# Patient Record
Sex: Female | Born: 1960 | Race: White | Hispanic: No | Marital: Single | Smoking: Former smoker
Health system: Southern US, Community
[De-identification: ages and names within clinical notes are randomized; demographics above are authoritative.]

## PROBLEM LIST (undated history)

## (undated) DIAGNOSIS — I7090 Unspecified atherosclerosis: Secondary | ICD-10-CM

## (undated) DIAGNOSIS — K7460 Unspecified cirrhosis of liver: Secondary | ICD-10-CM

## (undated) DIAGNOSIS — K449 Diaphragmatic hernia without obstruction or gangrene: Secondary | ICD-10-CM

## (undated) DIAGNOSIS — Z923 Personal history of irradiation: Secondary | ICD-10-CM

## (undated) DIAGNOSIS — IMO0001 Reserved for inherently not codable concepts without codable children: Secondary | ICD-10-CM

## (undated) DIAGNOSIS — IMO0002 Reserved for concepts with insufficient information to code with codable children: Secondary | ICD-10-CM

## (undated) DIAGNOSIS — C52 Malignant neoplasm of vagina: Secondary | ICD-10-CM

## (undated) HISTORY — DX: Reserved for concepts with insufficient information to code with codable children: IMO0002

## (undated) HISTORY — DX: Diaphragmatic hernia without obstruction or gangrene: K44.9

## (undated) HISTORY — DX: Reserved for inherently not codable concepts without codable children: IMO0001

## (undated) HISTORY — DX: Unspecified cirrhosis of liver: K74.60

## (undated) HISTORY — DX: Personal history of irradiation: Z92.3

## (undated) HISTORY — DX: Unspecified atherosclerosis: I70.90

---

## 1995-01-07 DIAGNOSIS — B1920 Unspecified viral hepatitis C without hepatic coma: Secondary | ICD-10-CM

## 1995-01-07 HISTORY — DX: Unspecified viral hepatitis C without hepatic coma: B19.20

## 2010-01-06 HISTORY — PX: ROBOTIC ASSISTED LAPAROSCOPIC HYSTERECTOMY AND SALPINGECTOMY: SHX6379

## 2014-08-02 ENCOUNTER — Encounter: Payer: Self-pay | Admitting: Nurse Practitioner

## 2014-08-16 ENCOUNTER — Encounter: Payer: Self-pay | Admitting: Certified Nurse Midwife

## 2014-08-30 ENCOUNTER — Ambulatory Visit: Payer: No Typology Code available for payment source | Attending: Gynecologic Oncology | Admitting: Gynecologic Oncology

## 2014-08-30 ENCOUNTER — Encounter: Payer: Self-pay | Admitting: Gynecologic Oncology

## 2014-08-30 VITALS — BP 165/93 | HR 73 | Temp 98.4°F | Resp 18 | Ht 68.0 in | Wt 163.4 lb

## 2014-08-30 DIAGNOSIS — R87629 Unspecified abnormal cytological findings in specimens from vagina: Secondary | ICD-10-CM | POA: Insufficient documentation

## 2014-08-30 DIAGNOSIS — R896 Abnormal cytological findings in specimens from other organs, systems and tissues: Secondary | ICD-10-CM | POA: Diagnosis present

## 2014-08-30 HISTORY — PX: COLPOSCOPY: SHX161

## 2014-08-30 NOTE — Patient Instructions (Signed)
We will call you with biopsy results.

## 2014-08-30 NOTE — Progress Notes (Signed)
Consult Note: Gyn-Onc  Consult was requested by NP Earnstine Regal for the evaluation of Kathy Little 54 y.o. 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  CC:  Chief Complaint  Patient presents with  . Genital Warts    Assessment/Plan:  [name]  is a 54 y.o.  year old with carcinoma on pap smear.   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  We will followup the results of today's biopsy. If they reveal VIN2 or 3, I will recommend CO2 laser of the upper vagina.  If they reveal invasive cancer, I will discuss options including upper vaginectomy or primary radiation. We would also obtain imaging with a PET to rule out distant disease (if invasive disease is found on biopsy).   HPI: Kathy Little is a 54 year old woman who is seen in consultation at the request of NP Earnstine Regal for squamous cell carcinoma cells seen on pap smear from 08/09/14.  The patient has a remote history of a laparoscopic total hysterectomy, BSO in [place] by Dr Loni Muse in 2012 for cervical dysplasia and abnormal uterine bleeding. Postoperatively she was seen once for "office laser", but did not have followup thereafter.  She began noticing brown vaginal discharge 3 weeks ago. This prompted her to be see by Earnstine Regal who performed an examination and pap smear (8.3.16). Results of the pap smear showed high risk HPV present and squamous cells showing squamous cell carcinoma.  The patient is a former smoker. She is otherwise very healthy. She has had no prior abdominal surgeries. She has had 2 vaginal deliveries.  Current Meds:  Outpatient Encounter Prescriptions as of 08/30/2014  Medication Sig  . ESTRACE VAGINAL 0.1 MG/GM vaginal cream INSERT 1 GRAM VAGINALLY THREE TIMES WEEKLY  . CHANTIX CONTINUING MONTH PAK 1 MG tablet TK 1 T PO BID  . CHANTIX STARTING MONTH PAK 0.5 MG X 11 & 1 MG X 42  tablet TK UTD   No facility-administered encounter medications on file as of 08/30/2014.    Allergy: Not on File  Social Hx:   Social History   Social History  . Marital Status: Unknown    Spouse Name: N/A  . Number of Children: N/A  . Years of Education: N/A   Occupational History  . Not on file.   Social History Main Topics  . Smoking status: Former Smoker -- 0.25 packs/day    Quit date: 08/28/2014  . Smokeless tobacco: Not on file  . Alcohol Use: No     Comment: occasional beer  . Drug Use: No  . Sexual Activity: Yes    Birth Control/ Protection: Condom   Other Topics Concern  . Not on file   Social History Narrative  . No narrative on file    Past Surgical Hx: History reviewed. No pertinent past surgical history.  Past Medical Hx: History reviewed. No pertinent past medical history.  Past Gynecological History:  Cervical dysplasia, hysterectomy in 2012  No LMP recorded.  Family Hx: History reviewed. No pertinent family history.  Review of Systems:  Constitutional  Feels well,    ENT Normal appearing ears and nares bilaterally Skin/Breast  No rash, sores, jaundice, itching, dryness Cardiovascular  No chest pain, shortness of breath, or edema  Pulmonary  No cough or wheeze.  Gastro Intestinal  No nausea, vomitting, or diarrhoea. No bright red blood per rectum, no abdominal pain, change  in bowel movement, or constipation.  Genito Urinary  No frequency, urgency, dysuria, + brown discharge Musculo Skeletal  No myalgia, arthralgia, joint swelling or pain  Neurologic  No weakness, numbness, change in gait,  Psychology  No depression, anxiety, insomnia.   Vitals:  Blood pressure 165/93, pulse 73, temperature 98.4 F (36.9 C), temperature source Oral, resp. rate 18, height 5\' 8"  (1.727 m), weight 163 lb 6.4 oz (74.118 kg), SpO2 98 %.  Physical Exam: WD in NAD Neck  Supple NROM, without any enlargements.  Lymph Node Survey No cervical  supraclavicular or inguinal adenopathy Cardiovascular  Pulse normal rate, regularity and rhythm. S1 and S2 normal.  Lungs  Clear to auscultation bilateraly, without wheezes/crackles/rhonchi. Good air movement.  Skin  No rash/lesions/breakdown  Psychiatry  Alert and oriented to person, place, and time  Abdomen  Normoactive bowel sounds, abdomen soft, non-tender and thin without evidence of hernia.  Back No CVA tenderness Genito Urinary  Vulva/vagina: Normal external female genitalia.  No lesions. No discharge or bleeding.  Bladder/urethra:  No lesions or masses, well supported bladder  Vagina: excrescences rough, verrucous lesions on the vaginal epithelium of the entire upper half of the vagina- colpo performed with biopsies (see below). Palpably normal and smooth. No masses or abnormal feeling vaginal mucosa. No paravaginal extension.   Cervix: surgically absent  Uterus: surgically absent   Adnexa: no palpable masses. Rectal  Good tone, no masses no cul de sac nodularity.  Extremities  No bilateral cyanosis, clubbing or edema.  Colposcopy and vaginal biopsy note: The patient provided verbal consent for the procedure. A speculum was placed. The entire vagina was inspected with the colposcope on white and green light.   5% acetic acid was applied. The entire upper half of the vagina stained white with acetic acid with abnormal vasculature noted. Verrucous lesions were present at the cuff and proximal right and left vaginal walls.   Biopsies were taken from the anterior vaginal cuff, and right and left proximal vaginal walls. Hemostasis was noted.  Donaciano Eva, MD   08/30/2014, 11:42 AM

## 2014-09-04 ENCOUNTER — Telehealth: Payer: Self-pay | Admitting: Gynecologic Oncology

## 2014-09-04 NOTE — Telephone Encounter (Signed)
Called and left patient a message to call back.  When she calls back I will inform her of the results of her biopsy showing invasive cancer/recurrent cervical cancer, and my recommendation will be for chemoradiation.  Donaciano Eva, MD

## 2014-09-05 ENCOUNTER — Telehealth: Payer: Self-pay | Admitting: Gynecologic Oncology

## 2014-09-05 DIAGNOSIS — N898 Other specified noninflammatory disorders of vagina: Secondary | ICD-10-CM

## 2014-09-05 DIAGNOSIS — C52 Malignant neoplasm of vagina: Secondary | ICD-10-CM

## 2014-09-05 NOTE — Telephone Encounter (Signed)
Patient calling requesting biopsy results.  Informed of biopsy results and need for chemorads per Dr. Denman George.  Also, per Dr. Serita Grit note, pt will need to have PET scan.  Patient verbalizing understanding.  Appts with Dr. Evlyn Clines, Dr. Gery Pray, and for PET scan will be arranged and the patient will be contacted with the dates and times.  Advised to call the office for any questions or concerns before that time.

## 2014-09-06 ENCOUNTER — Telehealth: Payer: Self-pay | Admitting: *Deleted

## 2014-09-06 NOTE — Telephone Encounter (Signed)
Called to notify patient that she is scheduled for an appointment with Dr. Sondra Come 09/13/14 at 2:30pm and a PET scan 09/15/14 at 8am. Patient wrote down information and is agreeable to both appointments - she states she already received a phone call with specific instructions for the PET scan. Told patient that we will be calling her back with an appointment to see Dr. Marko Plume as well. No questions or concerns voiced at this time.

## 2014-09-08 ENCOUNTER — Encounter: Payer: Self-pay | Admitting: Radiation Oncology

## 2014-09-08 ENCOUNTER — Telehealth: Payer: Self-pay | Admitting: *Deleted

## 2014-09-08 NOTE — Progress Notes (Addendum)
GYN Location of Tumor / Histology: invasive cancer/recurrent cervical cancer  Kathy Little presented when she began noticing brown vaginal discharge 4 weeks ago.  Biopsies revealed:   08/30/14 Diagnosis 1. Vagina, biopsy, anterior cuff - POORLY DIFFERENTIATED CARCINOMA WITH BASALOID FEATURES. - SEE MICROSCOPIC DESCRIPTION. 2. Vagina, biopsy, left vaginal wall - DETACHED EPITHELIAL FRAGMENTS WITH AT LEAST HIGH GRADE DYSPLASIA. - SEE MICROSCOPIC DESCRIPTION. 3. Vagina, biopsy, right vaginal wall - HIGH GRADE DYSPLASIA. - SEE MICROSCOPIC DESCRIPTION.  Past/Anticipated interventions by Gyn/Onc surgery, if any: biopsy done by Dr. Denman George on 08/30/14  Past/Anticipated interventions by medical oncology, if any: 09/15/14  Weight changes, if any: no  Bowel/Bladder complaints, if any: no  Nausea/Vomiting, if any: no  Pain issues, if any:  no  SAFETY ISSUES:  Prior radiation? no  Pacemaker/ICD? no  Possible current pregnancy? no  Is the patient on methotrexate? no  Current Complaints / other details:  PET scan scheduled for 09/15/14.  Patient has 3 children, (1 set of twins).  Reports she was having vaginal bleeding while using estrace cream.  She stopped using it on Friday and has not had any more bleeding.   BP 154/93 mmHg  Pulse 75  Temp(Src) 98.4 F (36.9 C) (Oral)  Resp 16  Ht 5\' 8"  (1.727 m)  Wt 163 lb 14.4 oz (74.345 kg)  BMI 24.93 kg/m2  SpO2 99%

## 2014-09-08 NOTE — Telephone Encounter (Signed)
Left message with future appointment details. Asked patient to call the GYN ONC to verify appointment details have been recieved

## 2014-09-10 ENCOUNTER — Other Ambulatory Visit: Payer: Self-pay | Admitting: Oncology

## 2014-09-10 DIAGNOSIS — C539 Malignant neoplasm of cervix uteri, unspecified: Secondary | ICD-10-CM

## 2014-09-12 ENCOUNTER — Other Ambulatory Visit: Payer: No Typology Code available for payment source

## 2014-09-12 ENCOUNTER — Telehealth: Payer: Self-pay | Admitting: Nurse Practitioner

## 2014-09-12 NOTE — Telephone Encounter (Signed)
Patient unable to come to chemo edu class today at 12:00. Informed Ursula Beath, Edu RN. Per [place], patient to come 09/13/14 at 10:00. Patient informed and is agreeable to new date/time. Will reschedule apt to reflect change.

## 2014-09-13 ENCOUNTER — Encounter: Payer: Self-pay | Admitting: General Practice

## 2014-09-13 ENCOUNTER — Encounter: Payer: Self-pay | Admitting: Radiation Oncology

## 2014-09-13 ENCOUNTER — Encounter: Payer: Self-pay | Admitting: *Deleted

## 2014-09-13 ENCOUNTER — Other Ambulatory Visit: Payer: No Typology Code available for payment source

## 2014-09-13 ENCOUNTER — Ambulatory Visit
Admission: RE | Admit: 2014-09-13 | Discharge: 2014-09-13 | Disposition: A | Discharge status: Home / Self Care | Payer: No Typology Code available for payment source | Source: Ambulatory Visit | Attending: Radiation Oncology | Admitting: Radiation Oncology

## 2014-09-13 VITALS — BP 154/93 | HR 75 | Temp 98.4°F | Resp 16 | Ht 68.0 in | Wt 163.9 lb

## 2014-09-13 DIAGNOSIS — Z510 Encounter for antineoplastic radiation therapy: Secondary | ICD-10-CM | POA: Insufficient documentation

## 2014-09-13 DIAGNOSIS — C539 Malignant neoplasm of cervix uteri, unspecified: Secondary | ICD-10-CM | POA: Diagnosis present

## 2014-09-13 DIAGNOSIS — C52 Malignant neoplasm of vagina: Secondary | ICD-10-CM | POA: Diagnosis present

## 2014-09-13 DIAGNOSIS — F17200 Nicotine dependence, unspecified, uncomplicated: Secondary | ICD-10-CM | POA: Insufficient documentation

## 2014-09-13 DIAGNOSIS — Z800 Family history of malignant neoplasm of digestive organs: Secondary | ICD-10-CM | POA: Diagnosis not present

## 2014-09-13 DIAGNOSIS — R87629 Unspecified abnormal cytological findings in specimens from vagina: Secondary | ICD-10-CM

## 2014-09-13 HISTORY — DX: Malignant neoplasm of vagina: C52

## 2014-09-13 NOTE — Progress Notes (Signed)
Radiation Oncology         [phone] ________________________________  Initial Outpatient Consultation  Name: Kathy Little MRN: [id number]  Date: 09/13/2014  DOB: May 16, 1960  CC:No primary care provider on file.  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 female who is here as courtesy of Dr. Denman George.   The patient has a remote history of a laparoscopic total hysterectomy, BSO in [place] by Dr Loni Muse in 2012 for cervical dysplasia and abnormal uterine bleeding. Postoperatively she was seen once for "office laser", but did not have followup thereafter.   She began noticing brown vaginal discharge 3 weeks ago. This prompted her to be see by Earnstine Regal who performed an examination and pap smear (8.3.16). Results of the pap smear showed high risk HPV present and squamous cells showing squamous cell carcinoma.   The patient is a former smoker. She is otherwise very healthy. She has had no prior abdominal surgeries. She has had 2 vaginal deliveries. She had a hysterectomy in 2013.    PREVIOUS RADIATION THERAPY: No  PAST MEDICAL HISTORY:  has a past medical history of Vaginal cancer.    PAST SURGICAL HISTORY: Past Surgical History  Procedure Laterality Date  . Colposcopy  08/30/14    and vaginal biopsy  . Robotic assisted laparoscopic hysterectomy and salpingectomy  2012    Dr. Loni Muse, [place]: family history includes Colon cancer in her mother.  SOCIAL HISTORY:  reports that she quit smoking about 2 weeks ago. She has never used smokeless tobacco. She reports that she does not drink alcohol or use illicit drugs.  ALLERGIES: Flagyl  MEDICATIONS:  Current Outpatient Prescriptions  Medication Sig Dispense Refill  . CHANTIX CONTINUING MONTH PAK 1 MG tablet TK 1 T PO BID  1  . CHANTIX STARTING MONTH PAK 0.5 MG X 11 & 1  MG X 42 tablet TK UTD  0  . ESTRACE VAGINAL 0.1 MG/GM vaginal cream INSERT 1 GRAM VAGINALLY THREE TIMES WEEKLY  11   No current facility-administered medications for this encounter.    REVIEW OF SYSTEMS:  A 15 point review of systems is documented in the electronic medical record. This was obtained by the nursing staff. However, I reviewed this with the patient to discuss relevant findings and make appropriate changes.   PET scan scheduled for 09/15/14. Patient has 3 children, (1 set of twins). Reports she was having vaginal bleeding while using estrace cream. She stopped using it on Friday and has not had any more bleeding. Denies bowel or appetite changes. Denies fatigue.    PHYSICAL EXAM:  height is 5\' 8"  (1.727 m) and weight is 163 lb 14.4 oz (74.345 kg). Her oral temperature is 98.4 F (36.9 C). Her blood pressure is 154/93 and her pulse is 75. Her respiration is 16 and oxygen saturation is 99%.    Neck: No palpable cervical or supraclavicular adenopathy. Eyes: PERRL Lungs: Clear to auscultation. Heart: Regular rhythm and rate. Extremities: No edema. Distal pulses intact. Pelvic: The external genitalia are unremarkable. Speculum exam is performed. There is some erythema and biopsy changes in the proximal vagina. No obvious visible mass or mucosal lesion. On bimanual examination there no pelvic masses appreciated. Motor strength is 5 out of 5 in the proximal and distal muscle groups of the upper lower extremities    ECOG =  1   LABORATORY DATA:  No results found for: WBC, HGB, HCT, MCV, PLT, NEUTROABS No results found for: NA, K, CL, CO2, GLUCOSE, CREATININE, CALCIUM    RADIOGRAPHY: No results found.    IMPRESSION: Ms. Onken is a pleasant 54 yo woman with invasive cancer/recurrent cervical cancer. Staging workup pending PET scan. If disease is localized to the vaginal area then she may be a potential candidate for partial vaginectomy. Other options to consider would be primary  radiation therapy with radiosensitizing chemotherapy particularly if the patient has nodal metastasis or more significant disease.  PLAN: Follow up after her PET scan on 09/15/14 to discuss possible radiation therapy.      This document serves as a record of services personally performed by Gery Pray, MD. It was created on his behalf by Arlyce Harman, a trained medical scribe. The creation of this record is based on the scribe's personal observations and the provider's statements to them. This document has been checked and approved by the attending provider. ------------------------------------------------  Blair Promise, PhD, MD

## 2014-09-13 NOTE — Progress Notes (Signed)
Spiritual Care Note  Met pt, who goes by Juliann Pulse, in chemo class this morning, providing pastoral presence, empathic listening, and normalization of feelings as she shared and processed the knowing/unknowing balance she's struggling with:  At the time of our meeting, she knew "that I have cancer bad enough to need all this [chemo, etc]," but not yet "any information about what it is or the extent of it."  Assured her of my ongoing availability for support for more processing, reflection, assistance with coping tools/meaning-making, or other emotional needs that may arise during treatment and reorientation.  Please also page as needed/desired.  Thank you.  [place], [place] Pager [phone] Voicemail [phone]

## 2014-09-15 ENCOUNTER — Ambulatory Visit (HOSPITAL_BASED_OUTPATIENT_CLINIC_OR_DEPARTMENT_OTHER): Payer: No Typology Code available for payment source | Admitting: Oncology

## 2014-09-15 ENCOUNTER — Telehealth: Payer: Self-pay | Admitting: Oncology

## 2014-09-15 ENCOUNTER — Encounter: Payer: Self-pay | Admitting: Oncology

## 2014-09-15 ENCOUNTER — Encounter (HOSPITAL_COMMUNITY): Payer: No Typology Code available for payment source

## 2014-09-15 ENCOUNTER — Ambulatory Visit (HOSPITAL_COMMUNITY)
Admission: RE | Admit: 2014-09-15 | Discharge: 2014-09-15 | Disposition: A | Discharge status: Home / Self Care | Payer: No Typology Code available for payment source | Source: Ambulatory Visit | Attending: Gynecologic Oncology | Admitting: Gynecologic Oncology

## 2014-09-15 ENCOUNTER — Ambulatory Visit: Payer: No Typology Code available for payment source

## 2014-09-15 ENCOUNTER — Other Ambulatory Visit (HOSPITAL_BASED_OUTPATIENT_CLINIC_OR_DEPARTMENT_OTHER): Payer: No Typology Code available for payment source

## 2014-09-15 VITALS — BP 157/71 | HR 61 | Temp 98.6°F | Resp 18 | Ht 68.0 in | Wt 164.3 lb

## 2014-09-15 DIAGNOSIS — A630 Anogenital (venereal) warts: Secondary | ICD-10-CM

## 2014-09-15 DIAGNOSIS — F17201 Nicotine dependence, unspecified, in remission: Secondary | ICD-10-CM | POA: Insufficient documentation

## 2014-09-15 DIAGNOSIS — D3502 Benign neoplasm of left adrenal gland: Secondary | ICD-10-CM | POA: Diagnosis not present

## 2014-09-15 DIAGNOSIS — Z0000 Encounter for general adult medical examination without abnormal findings: Secondary | ICD-10-CM

## 2014-09-15 DIAGNOSIS — C539 Malignant neoplasm of cervix uteri, unspecified: Secondary | ICD-10-CM

## 2014-09-15 DIAGNOSIS — K635 Polyp of colon: Secondary | ICD-10-CM

## 2014-09-15 DIAGNOSIS — R945 Abnormal results of liver function studies: Secondary | ICD-10-CM

## 2014-09-15 DIAGNOSIS — I2510 Atherosclerotic heart disease of native coronary artery without angina pectoris: Secondary | ICD-10-CM | POA: Diagnosis not present

## 2014-09-15 DIAGNOSIS — C801 Malignant (primary) neoplasm, unspecified: Secondary | ICD-10-CM | POA: Diagnosis not present

## 2014-09-15 DIAGNOSIS — D696 Thrombocytopenia, unspecified: Secondary | ICD-10-CM | POA: Diagnosis not present

## 2014-09-15 DIAGNOSIS — C52 Malignant neoplasm of vagina: Secondary | ICD-10-CM | POA: Insufficient documentation

## 2014-09-15 DIAGNOSIS — I700 Atherosclerosis of aorta: Secondary | ICD-10-CM

## 2014-09-15 DIAGNOSIS — Z800 Family history of malignant neoplasm of digestive organs: Secondary | ICD-10-CM | POA: Insufficient documentation

## 2014-09-15 DIAGNOSIS — Z23 Encounter for immunization: Secondary | ICD-10-CM

## 2014-09-15 DIAGNOSIS — R7989 Other specified abnormal findings of blood chemistry: Secondary | ICD-10-CM

## 2014-09-15 DIAGNOSIS — N898 Other specified noninflammatory disorders of vagina: Secondary | ICD-10-CM

## 2014-09-15 DIAGNOSIS — S68119A Complete traumatic metacarpophalangeal amputation of unspecified finger, initial encounter: Secondary | ICD-10-CM | POA: Insufficient documentation

## 2014-09-15 DIAGNOSIS — Z90710 Acquired absence of both cervix and uterus: Secondary | ICD-10-CM | POA: Diagnosis not present

## 2014-09-15 DIAGNOSIS — S68119D Complete traumatic metacarpophalangeal amputation of unspecified finger, subsequent encounter: Secondary | ICD-10-CM

## 2014-09-15 LAB — CBC WITH DIFFERENTIAL/PLATELET
BASO%: 0.7 % (ref 0.0–2.0)
BASOS ABS: 0 10*3/uL (ref 0.0–0.1)
EOS%: 6.6 % (ref 0.0–7.0)
Eosinophils Absolute: 0.3 10*3/uL (ref 0.0–0.5)
HEMATOCRIT: 49 % — AB (ref 34.8–46.6)
HGB: 16.8 g/dL — ABNORMAL HIGH (ref 11.6–15.9)
LYMPH#: 1.5 10*3/uL (ref 0.9–3.3)
LYMPH%: 33.4 % (ref 14.0–49.7)
MCH: 32.2 pg (ref 25.1–34.0)
MCHC: 34.3 g/dL (ref 31.5–36.0)
MCV: 94 fL (ref 79.5–101.0)
MONO#: 0.3 10*3/uL (ref 0.1–0.9)
MONO%: 7.4 % (ref 0.0–14.0)
NEUT#: 2.4 10*3/uL (ref 1.5–6.5)
NEUT%: 51.9 % (ref 38.4–76.8)
Platelets: 101 10*3/uL — ABNORMAL LOW (ref 145–400)
RBC: 5.21 10*6/uL (ref 3.70–5.45)
RDW: 12.2 % (ref 11.2–14.5)
WBC: 4.5 10*3/uL (ref 3.9–10.3)

## 2014-09-15 LAB — MAGNESIUM (CC13): MAGNESIUM: 2.3 mg/dL (ref 1.5–2.5)

## 2014-09-15 LAB — COMPREHENSIVE METABOLIC PANEL (CC13)
ALT: 117 U/L — AB (ref 0–55)
AST: 114 U/L — AB (ref 5–34)
Albumin: 3.9 g/dL (ref 3.5–5.0)
Alkaline Phosphatase: 178 U/L — ABNORMAL HIGH (ref 40–150)
Anion Gap: 6 mEq/L (ref 3–11)
BUN: 19.2 mg/dL (ref 7.0–26.0)
CHLORIDE: 108 meq/L (ref 98–109)
CO2: 29 meq/L (ref 22–29)
CREATININE: 0.8 mg/dL (ref 0.6–1.1)
Calcium: 9.7 mg/dL (ref 8.4–10.4)
EGFR: 85 mL/min/{1.73_m2} — ABNORMAL LOW (ref >90)
GLUCOSE: 93 mg/dL (ref 70–140)
POTASSIUM: 4.4 meq/L (ref 3.5–5.1)
SODIUM: 144 meq/L (ref 136–145)
Total Bilirubin: 0.85 mg/dL (ref 0.20–1.20)
Total Protein: 7.7 g/dL (ref 6.4–8.3)

## 2014-09-15 LAB — GLUCOSE, CAPILLARY: Glucose-Capillary: 89 mg/dL (ref 65–99)

## 2014-09-15 MED ORDER — LORAZEPAM 1 MG PO TABS: ORAL_TABLET | ORAL | Status: DC

## 2014-09-15 MED ORDER — INFLUENZA VAC SPLIT QUAD 0.5 ML IM SUSY
0.5000 mL | PREFILLED_SYRINGE | Freq: Once | INTRAMUSCULAR | Status: AC
Start: 2014-09-15 — End: 2014-09-15
  Administered 2014-09-15: 0.5 mL via INTRAMUSCULAR
  Filled 2014-09-15: qty 0.5

## 2014-09-15 MED ORDER — FLUDEOXYGLUCOSE F - 18 (FDG) INJECTION
8.0000 | Freq: Once | INTRAVENOUS | Status: DC | PRN
  Administered 2014-09-15: 8 via INTRAVENOUS
  Filled 2014-09-15: qty 8

## 2014-09-15 MED ORDER — ONDANSETRON HCL 8 MG PO TABS: 8.0000 mg | ORAL_TABLET | Freq: Three times a day (TID) | ORAL | Status: DC | PRN

## 2014-09-15 NOTE — Progress Notes (Signed)
[place] NEW PATIENT EVALUATION   Name: Kathy Little Date: September 15, 2014  MRN: [id number] DOB: 1960-05-03  REFERRING PHYSICIAN: Everitt Amber CC: Gery Pray, Earnstine Regal. No PCP in [place].   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 Patient has history of hysterectomy BSO in [place] 2012 for cervical dysplasia, those records requested by gyn oncology but not available at time of this consultation.   Patient moved to [facility] from [place] 2 years ago and had not established with physicians in [place] prior to recently noting 3 weeks of brownish vaginal discharge with some spotting. 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 Ezam by Dr Denman George was significant for verrucous lesions upper half of vagina but no masses or abnormal feeling vaginal mucosa and no paravaginal extension on exam. She had consultation with Dr Sondra Come on 09-13-14. PET done earlier day of this consultation (09-15-14) has no evidence of metastatic disease. Recommendation per my discussion with Dr Denman George now are for radiation with sensitizing CDDP in preference to vaginectomy, given her young age and apparent superficial involvement of the invasive cancer. Patient attended chemotherapy education class on 09-13-14.  Patient reports feeling well other than the slight vaginal drainage and spotting. Energy and appetite are good and weight is stable. She has no other bleeding. She has seasonal allergies, with slight cough NP in past few days only. No HA, reading  glasses, no difficulty hearing. No history of thyroid disease. No active dental concerns. No noted changes in breasts, had mammograms with [facility]ell reportedly unremarkable. Occasional GERD not new, uses occasional zantac. No change in bowels or noted blood in stools. Mild arthritis symptoms hands and knees. No cardiac symptoms. Bladder fine. No fever or symptoms of infection. No LE swelling. Peripheral IV access not difficult. No peripheral neuropathy. Minimal hot flashes prior to hysterectomy BSO, with severe hot flashes since that surgery, still daily.   Remainder of full 10 point review of systems negative.   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 Prescriptions for zofran and [place]   SOCIAL HISTORY: from [place], moved to [place] 2014 with significant other Lanny Hurst due to his job. 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 Significant other had worked with special needs individuals, now does transport for special needs and works part time with [employer]. Patient smokes < 1/2 pp week "social only" and has been able to quit for up to a year in past, has not smoked now in 2 weeks. Occasional beer, no drugs.  Patient has not found employment since moving to [facility]. One daughter here is Radio producer, son is high functioning with autism and employed with [employer]. One daughter in [place] has just started teaching special needs students. Patient  does not drive. "all of my children are 21 for the next few weeks"   FAMILY HISTORY:   Mother colon cancer age 50, thyroid disease, arthritis Father died of [place] age 74 Maternal grandfather pancreatic cancer Maternal grandmother dementia Son autistic, high functioning 2 daughters healthy        PHYSICAL EXAM:  height is 5' 8"  (1.727 m) and weight is 164 lb 4.8 oz (74.526 kg). Her oral temperature is 98.6 F (37 C). Her blood pressure is 157/71 and her pulse is 61. Her respiration is 18 and oxygen saturation is 100%.  Alert, pleasant, cooperative, healthy appearing lady looks stated age, excellent historian  HEENT: normal hair pattern, PERRL, not icteric. Oral mucosa and posterior pharynx clear and moist. Neck supple without JVD or thyroid mass.   RESPIRATORY:lungs clear to A and P  CARDIAC/ VASCULAR: heart RRR no gallop. Peripheral pulses intact and symmetrical  ABDOMEN: soft, not tender, no distension, no HSM or mass. Normally active BS  LYMPH NODES: no cervical, supraclavicular, axillary or inguinal adenopathy  BREASTS: bilaterally without dominant mass, skin or nipple findings. Axillae benign  NEUROLOGIC: CN, motor, sensory, cerebellar nonfocal . Left handed even prior to right fingers amputated. PSYCH appropriate mood and affect  SKIN: without rash, ecchymosis, petechiae  MUSCULOSKELETAL: back nontender. LE no edema, cords, tenderness. Amputation of fingers 3-4-5 right hand    LABORATORY DATA:  Results for orders placed or performed in visit on 09/15/14 (from the past 48 hour(s))  CBC with Differential     Status: Abnormal   Collection Time: 09/15/14  9:50 AM  Result Value Ref Range   WBC 4.5 3.9 - 10.3 10e3/uL   NEUT# 2.4 1.5 - 6.5 10e3/uL   HGB 16.8 (H) 11.6 - 15.9 g/dL   HCT 49.0 (H) 34.8 - 46.6 %   Platelets 101 (L) 145 - 400 10e3/uL   MCV 94.0 79.5 - 101.0 fL   MCH 32.2 25.1 - 34.0 pg   MCHC 34.3 31.5 - 36.0 g/dL   RBC 5.21 3.70 - 5.45 10e6/uL   RDW 12.2  11.2 - 14.5 %   lymph# 1.5 0.9 - 3.3 10e3/uL   MONO# 0.3 0.1 - 0.9 10e3/uL   Eosinophils Absolute 0.3 0.0 - 0.5 10e3/uL   Basophils Absolute 0.0 0.0 - 0.1 10e3/uL   NEUT% 51.9 38.4 - 76.8 %   LYMPH% 33.4 14.0 - 49.7 %   MONO% 7.4 0.0 - 14.0 %   EOS% 6.6 0.0 - 7.0 %   BASO% 0.7 0.0 - 2.0 %  Comprehensive metabolic panel (Cmet) - [facility]     Status: Abnormal   Collection Time: 09/15/14  9:50 AM  Result Value Ref Range   Sodium 144 136 - 145 mEq/L   Potassium 4.4 3.5 - 5.1 mEq/L   Chloride 108 98 - 109 mEq/L   CO2 29 22 - 29 mEq/L   Glucose 93 70 - 140 mg/dl   BUN 19.2 7.0 - 26.0 mg/dL   Creatinine 0.8 0.6 - 1.1 mg/dL   Total Bilirubin 0.85 0.20 - 1.20 mg/dL   Alkaline Phosphatase 178 (H) 40 - 150 U/L   AST 114 (H) 5 - 34 U/L   ALT 117 (H) 0 - 55 U/L   Total Protein 7.7 6.4 -  8.3 g/dL   Albumin 3.9 3.5 - 5.0 g/dL   Calcium 9.7 8.4 - 10.4 mg/dL   Anion Gap 6 3 - 11 mEq/L   EGFR 85 (L) >90 ml/min/1.73 m2    Comment: eGFR is calculated using the CKD-EPI Creatinine Equation (2009)  Magnesium     Status: None   Collection Time: 09/15/14  9:50 AM  Result Value Ref Range   Magnesium 2.3 1.5 - 2.5 mg/dl     Patient not aware of low platelets in past, no priors for comparison CMET not available until after visit, no priors for comparison  PATHOLOGY: Patient: BETSY, ROSELLO Collected: 08/30/2014 Client: [facility] Accession: [id number] Received: 08/30/2014 Everitt Amber, MD DOB: 16-May-1960 Age: 33 Gender: F Reported: 09/06/2014 [address] FINAL DIAGNOSIS Diagnosis 1. Vagina, biopsy, anterior cuff - POORLY DIFFERENTIATED CARCINOMA WITH BASALOID FEATURES. - SEE MICROSCOPIC DESCRIPTION. 2. Vagina, biopsy, left vaginal wall - DETACHED EPITHELIAL FRAGMENTS WITH AT LEAST HIGH GRADE DYSPLASIA. - SEE MICROSCOPIC DESCRIPTION. 3. Vagina, biopsy, right vaginal wall - HIGH GRADE DYSPLASIA. - SEE MICROSCOPIC DESCRIPTION. Microscopic Comment 1. 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 There is a small amount of stroma present, and diagnostic stromal invasion is not identified in this biopsy. Immunohistochemistry will be performed and reported as an addendum. 2. There is blood and microscopic detached epithelial fragments with at least high grade dysplasia. One of the fragments has features which favors high grade squamous dysplasia. There is no stoma present to evaluate for invasion. 3. There is a mucosal fragment with high dysplasia and the features strongly favor high grade squamous dysplasia.  RADIOGRAPHY: Nm Pet Image Initial (pi) Skull Base To Thigh  09/15/2014   CLINICAL DATA:  Initial treatment strategy for newly diagnosed poorly differentiated vaginal carcinoma.  EXAM: NUCLEAR MEDICINE PET SKULL BASE TO THIGH  TECHNIQUE: 8.0 mCi F-18 FDG was injected intravenously. Full-ring PET imaging was performed from the skull base to thigh after the radiotracer. CT data was obtained and used for attenuation correction and anatomic localization.  FASTING BLOOD GLUCOSE:  Value: 89 mg/dl  COMPARISON:  None.  FINDINGS: NECK  No hypermetabolic lymph nodes in the neck. Surgical plate with interlocking screws is noted in the anterior cervical spine at C5-6.  CHEST  No hypermetabolic axillary, mediastinal or hilar nodes. There is atherosclerosis of the thoracic aorta, the great vessels of the mediastinum and the coronary arteries, including calcified atherosclerotic plaque in the left anterior descending, left circumflex and right coronary arteries. No significant pulmonary nodules or acute consolidative airspace disease.  ABDOMEN/PELVIS  There is focal hypermetabolism with max SUV 4.3 in the anterior vagina at the introitus, in keeping with the primary vaginal carcinoma.  No abnormal hypermetabolic activity within the liver, pancreas, adrenal glands, or spleen. No hypermetabolic lymph nodes in the abdomen or pelvis. Top-normal size spleen. Non  hypermetabolic 1.3 cm left adrenal nodule (series 4/image 112) with density of 8 HU, in keeping with a benign left adrenal adenoma. Status post hysterectomy, with no abnormal findings at the vaginal cuff. No adnexal mass.  SKELETON  No focal hypermetabolic activity to suggest skeletal metastasis.  IMPRESSION: 1. Focal hypermetabolism (max SUV 4.3) in the anterior vagina at the introitus, in keeping with a primary vaginal carcinoma. 2. No hypermetabolic pelvic lymphadenopathy or distant metastatic disease. 3. Atherosclerosis, including three-vessel coronary artery disease. Please note that although the presence of coronary artery calcium documents the presence of coronary artery disease, the severity of  this disease and any potential stenosis cannot be assessed on this non-gated CT examination. Assessment for potential risk factor modification, dietary therapy or pharmacologic therapy may be warranted, if clinically indicated.   Electronically Signed   By: Ilona Sorrel M.D.   On: 09/15/2014 10:15   PET images reviewed on PACs and information discussed directly with Dr Denman George at time of patient's visit.   Report of mammograms done at E.Powell's office not available   DISCUSSION: all of history above reviewed with patient, results of PET discussed and recommendation discussed for radiation with sensitizing CDDP including rationale for the sensitizing chemotherapy. She is comfortable with information that she has received concerning CDDP, including need for aggressive oral and IV hydration. Antiemetics discussed. The chemotherapy will be coordinated with radiation, to begin weekly treatments close to start of the radiation. Due to transportation, it will be easiest for her to arrive for treatments no earlier than ~ 1000 if possible. I will coordinate my visits with her other appointments as best possible. Platelets 100k noted on labs today, possibly related to spleen size upper limits normal. Chemistries with  elevated LFTs resulted after visit. I will repeat all of these labs ~ next week, with hepatitis labs, may need GI evaluation or other depending on results. Hopefully we will be able to proceed with treatment of the recurrent cervical cancer without delay, but need to clarify situation further.  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 Recommendation from gyn oncology for radiation with sensitizing CDDP. Scheduling pending 2.vaginal warts, high risk HPV 3.post laparoscopic total hysterectomy BSO in [place] 2012. Gyn oncology has requested records 4. Elevated hemoglobin and mild thrombocytopenia: no prior CBCs for comparison now, tho we will try to get records from physicians in [place]. Follow Hgb off of cigarettes. Spleen upper limits normal by PET. Repeat CBC with morphology within the week. Thrombocytopenia more significant with planned chemotherapy. 5. Elevated AP, AST, ALT with normal bilirubin, normal electrolytes, normal renal function. No history of same known to patient, will try to get records from prior physicians. Repeat + hepatitis studies within the week. May need GI evaluation. 6.history from patient of 5 colon polyps at colonoscopy 2013, recommendation for repeat "in 8 years" which may not be optimal. Family history colon cancer in mother  In 40s. Will need consultation with GI for this going forward 7.flu vaccine given today prior to start of chemo 8. Long but minimal tobacco, now [facility].  9.amputation of fingers right hand age 27 after trauma then severe frostbite 10.cervical spine orthopedic surgery 11.coronary artery calcifications on imaging. She needs to establish primary care, referral made now to internal medicine, with other cardiology evaluation as appropriate  from there.  Patient had questions answered to her satisfaction and is in agreement with plan above. Verbal consent given for chemotherapy. She can contact this office for questions or concerns at any time prior to next scheduled visit. Chemotherapy and granix orders placed for preauthorization, may need to change dates depending on RT schedule.  Time spent 60 min , including >50% discussion and coordination of care. Cc Drs Denman George and Christain Sacramento, MD 09/15/2014 11:32 AM

## 2014-09-15 NOTE — Telephone Encounter (Signed)
Gave avs & calendar for September/October. °

## 2014-09-15 NOTE — Addendum Note (Signed)
Encounter addended by: Jacqulyn Liner, RN on: 09/15/2014 12:32 PM<BR>     Documentation filed: Charges VN, Visit Diagnoses

## 2014-09-15 NOTE — Progress Notes (Signed)
Flu injection given by desk nurse.

## 2014-09-17 ENCOUNTER — Other Ambulatory Visit: Payer: Self-pay | Admitting: Oncology

## 2014-09-17 DIAGNOSIS — R7989 Other specified abnormal findings of blood chemistry: Secondary | ICD-10-CM | POA: Insufficient documentation

## 2014-09-17 DIAGNOSIS — D696 Thrombocytopenia, unspecified: Secondary | ICD-10-CM

## 2014-09-17 DIAGNOSIS — K7460 Unspecified cirrhosis of liver: Secondary | ICD-10-CM

## 2014-09-17 DIAGNOSIS — R945 Abnormal results of liver function studies: Secondary | ICD-10-CM

## 2014-09-17 DIAGNOSIS — I2510 Atherosclerotic heart disease of native coronary artery without angina pectoris: Secondary | ICD-10-CM | POA: Insufficient documentation

## 2014-09-18 ENCOUNTER — Other Ambulatory Visit: Payer: Self-pay | Admitting: Oncology

## 2014-09-18 ENCOUNTER — Telehealth: Payer: Self-pay | Admitting: Nurse Practitioner

## 2014-09-18 ENCOUNTER — Telehealth: Payer: Self-pay | Admitting: Oncology

## 2014-09-18 DIAGNOSIS — B1920 Unspecified viral hepatitis C without hepatic coma: Secondary | ICD-10-CM

## 2014-09-18 NOTE — Telephone Encounter (Signed)
Spoke with patient and she is aware of her lab work added for 9/13 per [facility]

## 2014-09-18 NOTE — Telephone Encounter (Signed)
-----   Message from Gordy Levan, MD sent at 09/17/2014  3:23 PM EDT ----- I have put in POF for labs to be repeated hopefully early this week - I sent it urgent to schedulers so they may have contacted patient by time RN gets message, but RN please let patient know that I want to recheck labs due to low platelets, that she and I discussed at visit, and also recheck liver chemistries and hepatitis tests, as liver chemistries returned with some elevations after her visit.   The PET did not show any problems on imaging of liver, but the chemistries could possibly be some type of hepatitis or a medication reaction.  Please ask if any history of hepatitis (she did work in nursing facility x years).  Please also get name of PCP in [place] and contact information if patient has it. Please have her sign release of information for [place] to request records, if not already done.   We need to be sure to let her know results of the repeat labs.  thanks

## 2014-09-18 NOTE — Telephone Encounter (Signed)
RN called patient regarding MD message below. She has been made aware of lab apt on 09/19/14. She remembers that she was diagnosed with Hepatitis C in 1998 and treated with "interferon and shots at home" until 1999 when treatment was completed. She received care at [facility] - gastroenterology. Patient reports she will bring a form tomorrow that will allow [place] to request records from care team in [place], [place] thanks her for this. Also notified that PET did not show liver problems, per MD. She knows to inform lab that she needs to see Dr. Mariana Kaufman RN tomorrow to hand off release of information form. She will call with any new questions or concerns and thanks for the call.

## 2014-09-19 ENCOUNTER — Encounter: Payer: Self-pay | Admitting: Nurse Practitioner

## 2014-09-19 ENCOUNTER — Ambulatory Visit (HOSPITAL_BASED_OUTPATIENT_CLINIC_OR_DEPARTMENT_OTHER): Payer: No Typology Code available for payment source

## 2014-09-19 DIAGNOSIS — D696 Thrombocytopenia, unspecified: Secondary | ICD-10-CM | POA: Diagnosis not present

## 2014-09-19 DIAGNOSIS — R7989 Other specified abnormal findings of blood chemistry: Secondary | ICD-10-CM

## 2014-09-19 DIAGNOSIS — R945 Abnormal results of liver function studies: Secondary | ICD-10-CM

## 2014-09-19 LAB — MORPHOLOGY
PLATELET MORPHOLOGY: NORMAL
PLT EST: DECREASED
RBC COMMENTS: NORMAL

## 2014-09-19 LAB — COMPREHENSIVE METABOLIC PANEL (CC13)
ALBUMIN: 3.8 g/dL (ref 3.5–5.0)
ALK PHOS: 163 U/L — AB (ref 40–150)
ALT: 110 U/L — ABNORMAL HIGH (ref 0–55)
ANION GAP: 8 meq/L (ref 3–11)
AST: 116 U/L — ABNORMAL HIGH (ref 5–34)
BILIRUBIN TOTAL: 0.65 mg/dL (ref 0.20–1.20)
BUN: 22.8 mg/dL (ref 7.0–26.0)
CALCIUM: 9.4 mg/dL (ref 8.4–10.4)
CO2: 27 meq/L (ref 22–29)
Chloride: 108 mEq/L (ref 98–109)
Creatinine: 0.8 mg/dL (ref 0.6–1.1)
EGFR: 86 mL/min/{1.73_m2} — AB (ref >90)
Glucose: 111 mg/dl (ref 70–140)
Potassium: 3.9 mEq/L (ref 3.5–5.1)
Sodium: 143 mEq/L (ref 136–145)
TOTAL PROTEIN: 7.3 g/dL (ref 6.4–8.3)

## 2014-09-19 LAB — CBC WITH DIFFERENTIAL/PLATELET
BASO%: 1 % (ref 0.0–2.0)
Basophils Absolute: 0 10*3/uL (ref 0.0–0.1)
EOS ABS: 0.2 10*3/uL (ref 0.0–0.5)
EOS%: 6.3 % (ref 0.0–7.0)
HCT: 45.1 % (ref 34.8–46.6)
HGB: 15.4 g/dL (ref 11.6–15.9)
LYMPH%: 30.7 % (ref 14.0–49.7)
MCH: 32.4 pg (ref 25.1–34.0)
MCHC: 34.2 g/dL (ref 31.5–36.0)
MCV: 94.7 fL (ref 79.5–101.0)
MONO#: 0.3 10*3/uL (ref 0.1–0.9)
MONO%: 7.3 % (ref 0.0–14.0)
NEUT%: 54.7 % (ref 38.4–76.8)
NEUTROS ABS: 2.1 10*3/uL (ref 1.5–6.5)
Platelets: 95 10*3/uL — ABNORMAL LOW (ref 145–400)
RBC: 4.76 10*6/uL (ref 3.70–5.45)
RDW: 12.2 % (ref 11.2–14.5)
WBC: 3.8 10*3/uL — AB (ref 3.9–10.3)
lymph#: 1.2 10*3/uL (ref 0.9–3.3)

## 2014-09-19 NOTE — Progress Notes (Signed)
Faxed release of information and request for medical records from [employer] [facility] in [place]. Spoke to patient in the lab per Dr. Marko Plume about referral to Hep C clinic for evaluation and assistance with this patient's care. Informed that she will hear from Hep C clinic for appt. She verbalizes understanding and thanks for information.

## 2014-09-20 ENCOUNTER — Telehealth: Payer: Self-pay

## 2014-09-20 LAB — HEPATITIS C RNA QUANTITATIVE
HCV Quantitative Log: 6.4 {Log} — ABNORMAL HIGH (ref <1.18)
HCV Quantitative: 2528390 IU/mL — ABNORMAL HIGH (ref <15)

## 2014-09-20 LAB — HEPATITIS PANEL, ACUTE
HCV AB: REACTIVE — AB
HEP B C IGM: NONREACTIVE
HEP B S AG: NEGATIVE
Hep A IgM: NONREACTIVE

## 2014-09-20 NOTE — Telephone Encounter (Signed)
Left a message to return phone call regarding status of referral / appointment made by Dr. Marko Plume on 09-18-14.

## 2014-09-20 NOTE — Telephone Encounter (Signed)
-----   Message from Gordy Levan, MD sent at 09/19/2014  7:51 PM EDT ----- Please follow up my referral to Hepatitis Clinic - hopefully can get appointment very soon  thanks

## 2014-09-22 ENCOUNTER — Other Ambulatory Visit: Payer: Self-pay | Admitting: Oncology

## 2014-09-22 NOTE — Telephone Encounter (Signed)
Left a message to return phone call regarding status of referral / appointment made by Dr. Marko Plume on 09-18-14.

## 2014-09-22 NOTE — Telephone Encounter (Signed)
Spoke with Ms. Valent and told her that Her plt count from 9-13 still low at 95; alk. Phos 163; AST116; and ALT is 110.  These elevations in the chemistries are ~same as done on 09-15-14 which is consistent with Hepatitis C. Told her that Dr. Marko Plume is still working on getting an appointment at the Hepatitis C Clinic. Ms. Burgoon will keep appointment on 09-28-14 as scheduled.

## 2014-09-22 NOTE — Progress Notes (Signed)
Received medical records from [facility].  Copy sent to HIM to be scanned into patient's EMR and one placed on Dr. Mariana Kaufman desk for review.

## 2014-09-24 ENCOUNTER — Other Ambulatory Visit: Payer: Self-pay | Admitting: Oncology

## 2014-09-25 ENCOUNTER — Other Ambulatory Visit: Payer: Self-pay | Admitting: Oncology

## 2014-09-25 ENCOUNTER — Telehealth: Payer: Self-pay | Admitting: Lab

## 2014-09-25 ENCOUNTER — Other Ambulatory Visit: Payer: Self-pay

## 2014-09-25 DIAGNOSIS — R7989 Other specified abnormal findings of blood chemistry: Secondary | ICD-10-CM

## 2014-09-25 DIAGNOSIS — B182 Chronic viral hepatitis C: Secondary | ICD-10-CM

## 2014-09-25 DIAGNOSIS — R945 Abnormal results of liver function studies: Principal | ICD-10-CM

## 2014-09-25 NOTE — Telephone Encounter (Signed)
Return telephone call re: pt from [place] at Dr. Camila Li office.  I had not received referral but because of phone call-reviewed pt's  chart to see if referral was in [place].  Since it is I will start to work on it asap.

## 2014-09-25 NOTE — Telephone Encounter (Signed)
Louise from [employer] office informed

## 2014-09-25 NOTE — Telephone Encounter (Signed)
Kathy Little with Dr. Linus Salmons in [facility] called stating that Kathy Little needs labs drawn and then once they are done she can schedule an appointment with Dr. Linus Salmons.  She has some openings  On 10-31-14. Labs to be done 09-28-14 with visit that day. Pls. Let Kathy Little know when the labs have resulted. Difficulty by Dr. Marko Plume ordering the HIV antibody.  She will discuss with Mervyn Skeeters in IT so lab can be entered.

## 2014-09-26 ENCOUNTER — Encounter: Payer: Self-pay | Admitting: *Deleted

## 2014-09-26 NOTE — Progress Notes (Signed)
[place] Psychosocial Distress Screening Clinical Social Work  Clinical Social Work was referred by distress screening protocol.  The patient scored a 6 on the Psychosocial Distress Thermometer which indicates moderate distress. Clinical Social Worker contacted patient by phone to assess for distress and other psychosocial needs.  Kathy Little shared she is managing her diagnosis well, but was initially overwhelmed by her diagnosis and possible heart problems communicated by her medical oncologist.  The patient lives in [place] with her significant other, Lanny Hurst.  She is from [place], she relies heavily on the support of her aunt that lives in [place], she shared that her aunt is very positive and helps her change her perspective.  The patient is interested in completing her healthcare advance directives with her significant other.  CSW briefly explained CSW role and support programs/services available to patient.  ONCBCN DISTRESS SCREENING 09/13/2014  Screening Type Initial Screening  Distress experienced in past week (1-10) 6  Emotional problem type Nervousness/Anxiety    Clinical Social Worker follow up needed: Yes.    If yes, follow up plan: Will follow up with patient at first infusion and will meet with patient/caregiver at a later date to complete advance directives.  Polo Riley, MSW, LCSW, OSW-C Clinical Social Worker [facility] [phone]

## 2014-09-27 ENCOUNTER — Encounter: Payer: Self-pay | Admitting: Pharmacist

## 2014-09-27 ENCOUNTER — Other Ambulatory Visit: Payer: Self-pay | Admitting: Oncology

## 2014-09-28 ENCOUNTER — Other Ambulatory Visit: Payer: Self-pay | Admitting: *Deleted

## 2014-09-28 ENCOUNTER — Telehealth: Payer: Self-pay | Admitting: *Deleted

## 2014-09-28 ENCOUNTER — Other Ambulatory Visit (HOSPITAL_BASED_OUTPATIENT_CLINIC_OR_DEPARTMENT_OTHER): Payer: No Typology Code available for payment source

## 2014-09-28 ENCOUNTER — Encounter: Payer: Self-pay | Admitting: Oncology

## 2014-09-28 ENCOUNTER — Ambulatory Visit
Admission: RE | Admit: 2014-09-28 | Discharge: 2014-09-28 | Disposition: A | Discharge status: Home / Self Care | Payer: No Typology Code available for payment source | Source: Ambulatory Visit | Attending: Radiation Oncology | Admitting: Radiation Oncology

## 2014-09-28 ENCOUNTER — Ambulatory Visit (HOSPITAL_BASED_OUTPATIENT_CLINIC_OR_DEPARTMENT_OTHER): Payer: No Typology Code available for payment source | Admitting: Oncology

## 2014-09-28 VITALS — BP 154/93 | HR 60 | Temp 98.2°F | Resp 18 | Ht 68.0 in | Wt 166.9 lb

## 2014-09-28 VITALS — BP 160/79 | HR 62 | Temp 98.2°F | Ht 68.0 in | Wt 167.0 lb

## 2014-09-28 DIAGNOSIS — I2510 Atherosclerotic heart disease of native coronary artery without angina pectoris: Secondary | ICD-10-CM | POA: Diagnosis not present

## 2014-09-28 DIAGNOSIS — D696 Thrombocytopenia, unspecified: Secondary | ICD-10-CM

## 2014-09-28 DIAGNOSIS — R7989 Other specified abnormal findings of blood chemistry: Secondary | ICD-10-CM

## 2014-09-28 DIAGNOSIS — C539 Malignant neoplasm of cervix uteri, unspecified: Secondary | ICD-10-CM

## 2014-09-28 DIAGNOSIS — D72819 Decreased white blood cell count, unspecified: Secondary | ICD-10-CM | POA: Diagnosis not present

## 2014-09-28 DIAGNOSIS — B182 Chronic viral hepatitis C: Secondary | ICD-10-CM

## 2014-09-28 DIAGNOSIS — Z87891 Personal history of nicotine dependence: Secondary | ICD-10-CM

## 2014-09-28 DIAGNOSIS — Z510 Encounter for antineoplastic radiation therapy: Secondary | ICD-10-CM | POA: Diagnosis not present

## 2014-09-28 DIAGNOSIS — R945 Abnormal results of liver function studies: Secondary | ICD-10-CM

## 2014-09-28 LAB — COMPREHENSIVE METABOLIC PANEL (CC13)
ALT: 118 U/L — ABNORMAL HIGH (ref 0–55)
ANION GAP: 6 meq/L (ref 3–11)
AST: 107 U/L — ABNORMAL HIGH (ref 5–34)
Albumin: 3.9 g/dL (ref 3.5–5.0)
Alkaline Phosphatase: 175 U/L — ABNORMAL HIGH (ref 40–150)
BUN: 16.7 mg/dL (ref 7.0–26.0)
CHLORIDE: 106 meq/L (ref 98–109)
CO2: 28 meq/L (ref 22–29)
Calcium: 9.1 mg/dL (ref 8.4–10.4)
Creatinine: 0.8 mg/dL (ref 0.6–1.1)
EGFR: 86 mL/min/{1.73_m2} — AB (ref >90)
Glucose: 103 mg/dl (ref 70–140)
Potassium: 4 mEq/L (ref 3.5–5.1)
Sodium: 140 mEq/L (ref 136–145)
Total Bilirubin: 0.74 mg/dL (ref 0.20–1.20)
Total Protein: 7.6 g/dL (ref 6.4–8.3)

## 2014-09-28 LAB — CBC WITH DIFFERENTIAL/PLATELET
BASO%: 0.3 % (ref 0.0–2.0)
Basophils Absolute: 0 10*3/uL (ref 0.0–0.1)
EOS%: 3.5 % (ref 0.0–7.0)
Eosinophils Absolute: 0.1 10*3/uL (ref 0.0–0.5)
HEMATOCRIT: 46.3 % (ref 34.8–46.6)
HGB: 16 g/dL — ABNORMAL HIGH (ref 11.6–15.9)
LYMPH#: 1.4 10*3/uL (ref 0.9–3.3)
LYMPH%: 37.9 % (ref 14.0–49.7)
MCH: 32.7 pg (ref 25.1–34.0)
MCHC: 34.6 g/dL (ref 31.5–36.0)
MCV: 94.5 fL (ref 79.5–101.0)
MONO#: 0.3 10*3/uL (ref 0.1–0.9)
MONO%: 7.2 % (ref 0.0–14.0)
NEUT%: 51.1 % (ref 38.4–76.8)
NEUTROS ABS: 1.9 10*3/uL (ref 1.5–6.5)
Platelets: 78 10*3/uL — ABNORMAL LOW (ref 145–400)
RBC: 4.9 10*6/uL (ref 3.70–5.45)
RDW: 12.3 % (ref 11.2–14.5)
WBC: 3.8 10*3/uL — AB (ref 3.9–10.3)

## 2014-09-28 LAB — IRON AND TIBC CHCC
%SAT: 83 % — AB (ref 21–57)
Iron: 270 ug/dL — ABNORMAL HIGH (ref 41–142)
TIBC: 324 ug/dL (ref 236–444)
UIBC: 53 ug/dL — AB (ref 120–384)

## 2014-09-28 LAB — MORPHOLOGY
PLT EST: DECREASED
RBC Comments: NORMAL

## 2014-09-28 LAB — PROTIME-INR
INR: 1 — AB (ref 2.00–3.50)
PROTIME: 12 s (ref 10.6–13.4)

## 2014-09-28 MED ORDER — LORATADINE 10 MG PO TABS: 10.0000 mg | ORAL_TABLET | Freq: Every day | ORAL | Status: DC

## 2014-09-28 MED ORDER — SODIUM CHLORIDE 0.9 % IJ SOLN
10.0000 mL | Freq: Once | INTRAMUSCULAR | Status: AC
  Administered 2014-09-28: 10 mL via INTRAVENOUS

## 2014-09-28 NOTE — Progress Notes (Signed)
OFFICE PROGRESS NOTE   September 28, 2014   [place] CC: Gery Pray, Earnstine Regal. No PCP   Outside records received from [employer] in [place], reviewed and will be scanned into this EMR. Visit 05-27-2012 lists chronic hepatitis C, angioedema to ACE inhibitors, mammogram 02-04-2012 (report not included).  Labs 01-15-2012:  AST 67, ALT 82, AP 140, Tbili 0.6. Hep C AB reactive,  HCV RNA genotype Ia ,  Hep A AB nonreactive, Hep B core reactive, Hep B surface AB <5 nonreactive,  Iron 130, %sat 34.   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 Situation is complicated by finding of Hepatitis C now, this having been treated with a year of interferon in ~ 1996. She had CT simulation earlier today. RT schedule not yet in EMR, tho patient understands this is to begin next week on Tues, in which case we will keep first chemo on 10-02-14 as scheduled. Additional hepatitis studies drawn today at request of ID. She will be given new patient appointment in hepatitis clinic when these are resulted.    Patient is having minimal brownish vaginal drainage only. She denies any discomfort, any other bleeding. She is not having sexual activity now.  Only new problem is itching on forearms bilaterally, possibly insect related, no one else with similar symptoms at home, no different contacts apparent. She sleeps for about 4 hrs after benadryl at hs, wakens with itching and some understandable anxiety about medical issues.   No central catheter Flu vaccine given 09-15-14.  ONCOLOGIC HISTORY Patient has history of hysterectomy BSO in [place] 2012 for cervical dysplasia Patient moved to [place] from [place] 2 years ago and had not established with physicians in [place] prior to recently noting 3 weeks of brownish  vaginal discharge with some spotting. 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 Ezam by Dr Denman George was significant for verrucous lesions upper half of vagina but no masses or abnormal feeling vaginal mucosa and no paravaginal extension on exam. She had consultation with Dr Sondra Come on 09-13-14. PET done earlier day of this consultation (09-15-14) has no evidence of metastatic disease. Recommendation per my discussion with Dr Denman George now are for radiation with sensitizing CDDP in preference to vaginectomy, given her young age and apparent superficial involvement of the invasive cancer.     Review of systems as above, also: Tolerated flu vaccine without problems. Bowels ok. Appetite ok. No SOB, no other pain, no N/V. Walking 20 min twice daily, tolerated well. Remainder of 10 point Review of Systems negative.  Objective:  Vital signs in last 24 hours:  BP 154/93 mmHg  Pulse 60  Temp(Src) 98.2 F (36.8 C) (Oral)  Resp 18  Ht 5' 8"  (1.727 m)  Wt 166 lb 14.4 oz (75.705 kg)  BMI 25.38 kg/m2  SpO2 100% Weight up 2 lbs. Alert, oriented and appropriate, very pleasant as always, looks comfortable. Ambulatory without difficulty.  No alopecia  HEENT:PERRL, sclerae not icteric. Oral mucosa moist without lesions, posterior pharynx clear.  Neck supple. No JVD.  Lymphatics:no supraclavicular adenopathy Resp: clear to auscultation bilaterally and normal percussion bilaterally Cardio: regular rate and rhythm. No  gallop. GI: soft, nontender, not distended, no mass or organomegaly. Normally active bowel sounds. Musculoskeletal/ Extremities: without pitting edema, cords, tenderness Neuro: no peripheral neuropathy. Otherwise nonfocal. Psych appropriate mood and affect Skin slightly excoriated  scattered rash lateral forearms no evidence of suprainfection, no vesicles. Otherwise without rash, ecchymosis, petechiae   Lab Results:  Results for orders placed or performed in visit on 09/28/14  CBC with Differential  Result Value Ref Range   WBC 3.8 (L) 3.9 - 10.3 10e3/uL   NEUT# 1.9 1.5 - 6.5 10e3/uL   HGB 16.0 (H) 11.6 - 15.9 g/dL   HCT 46.3 34.8 - 46.6 %   Platelets 78 (L) 145 - 400 10e3/uL   MCV 94.5 79.5 - 101.0 fL   MCH 32.7 25.1 - 34.0 pg   MCHC 34.6 31.5 - 36.0 g/dL   RBC 4.90 3.70 - 5.45 10e6/uL   RDW 12.3 11.2 - 14.5 %   lymph# 1.4 0.9 - 3.3 10e3/uL   MONO# 0.3 0.1 - 0.9 10e3/uL   Eosinophils Absolute 0.1 0.0 - 0.5 10e3/uL   Basophils Absolute 0.0 0.0 - 0.1 10e3/uL   NEUT% 51.1 38.4 - 76.8 %   LYMPH% 37.9 14.0 - 49.7 %   MONO% 7.2 0.0 - 14.0 %   EOS% 3.5 0.0 - 7.0 %   BASO% 0.3 0.0 - 2.0 %  Morphology  Result Value Ref Range   RBC Comments Within Normal Limits Within Normal Limits   White Cell Comments C/W auto diff    PLT EST Decreased Adequate  Comprehensive metabolic panel (Cmet) - [facility]  Result Value Ref Range   Sodium 140 136 - 145 mEq/L   Potassium 4.0 3.5 - 5.1 mEq/L   Chloride 106 98 - 109 mEq/L   CO2 28 22 - 29 mEq/L   Glucose 103 70 - 140 mg/dl   BUN 16.7 7.0 - 26.0 mg/dL   Creatinine 0.8 0.6 - 1.1 mg/dL   Total Bilirubin 0.74 0.20 - 1.20 mg/dL   Alkaline Phosphatase 175 (H) 40 - 150 U/L   AST 107 (H) 5 - 34 U/L   ALT 118 (H) 0 - 55 U/L   Total Protein 7.6 6.4 - 8.3 g/dL   Albumin 3.9 3.5 - 5.0 g/dL   Calcium 9.1 8.4 - 10.4 mg/dL   Anion Gap 6 3 - 11 mEq/L   EGFR 86 (L) >90 ml/min/1.73 m2  Iron and TIBC [facility]  Result Value Ref Range   Iron 270 (H) 41 - 142 ug/dL   TIBC 324 236 - 444 ug/dL   UIBC 53 (L) 120 - 384 ug/dL   %SAT 83 (H) 21 - 57 %  Protime-INR  Result Value Ref Range   Protime 12.0 10.6 - 13.4 Seconds   INR 1.00 (L) 2.00 - 3.50   Lovenox No    HIV resulted after visit negative Hep C genotype pending Hep B and ANA  pending  Studies/Results:  No results found.  Medications: I have reviewed the patient's current medications. Fine to use lortadine in addition to benadryl, which she believes her insurance will cover with prescription. Can repeat benadryl if wakens with itching later in night. Can try topical hydrocortisone cream bid to rash on arms  DISCUSSION Patient understands that she appears to have chronic hepatitis C despite previous interferon, and that additional labs today will give more information to help in infectious disease treatment decisions. As CDDP is not metabolized in liver, it appears appropriate to proceed as planned in treatment  of the gyn cancer, however I will also try to speak with correct ID physician when her new patient appointment is set up. We will follow liver function closely and may need to decrease or hold doses depending on counts. The slight thrombocytopenia is likely related to mildly enlarged spleen, also likely related to liver disease.   She needs referral for a PCP, which we will request.  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 Recommendation from gyn oncology for radiation with sensitizing CDDP weekly, possibly to begin next week 2.chronic hepatitis C: documented in 2014 per medical records obtained now. Refer to hepatitis clinic; I will speak with ID physician re upcoming chemo when we know which provider. 3.post laparoscopic total hysterectomy BSO in [place]. 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 She understands that we may need to hold or adjust even this low dose CDDP depending on counts. Hemoglobin upper normal likely with long tobacco, follow. Encouraged good hydration. 5.vaginal warts, high risk HPV 6.history from patient  of 5 colon polyps at colonoscopy 2013, recommendation for repeat "in 8 years" which may not be optimal. Family history colon cancer in mother In 23s. Will need consultation with GI for this going forward 7.flu vaccine given 09-15-14 8. Long but minimal tobacco, now [facility].  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 She needs to establish primary care, referral made to internal medicine at earlier appointment, not yet set up (?).  Other cardiology evaluation as appropriate per PCP   All questions answered and patient is in agreement with plans as above. Chemo orders confirmed, will treat if ANC >=1.5 and plt >=90K. I will see patient coordinating with other appointments. Time spent 30 min including >50% counseling and coordination of care.   LIVESAY,LENNIS P, MD   09/28/2014, 3:13 PM

## 2014-09-28 NOTE — Telephone Encounter (Signed)
Pt has appt with Dr Linus Salmons [facility] 10/17/14 @ [phone]

## 2014-09-28 NOTE — Telephone Encounter (Signed)
Diane with Dr Henreitta Leber office notified that labs have been drawn.

## 2014-09-28 NOTE — Progress Notes (Signed)
Patient here for IV start.  She denies having an allergy to IV contrast dye.  Her BUN was 22.8 and CR 0.8 from 09/19/14.  #22 gauge IV started in her right wrist.  Blood return noted.  Covered with tegaderm and tape.  Flushed with 10 cc normal saline.

## 2014-09-29 ENCOUNTER — Other Ambulatory Visit: Payer: Self-pay | Admitting: Oncology

## 2014-09-29 ENCOUNTER — Telehealth: Payer: Self-pay | Admitting: Oncology

## 2014-09-29 ENCOUNTER — Encounter: Payer: Self-pay | Admitting: Oncology

## 2014-09-29 DIAGNOSIS — C539 Malignant neoplasm of cervix uteri, unspecified: Secondary | ICD-10-CM

## 2014-09-29 LAB — HIV ANTIBODY (ROUTINE TESTING W REFLEX): HIV 1&2 Ab, 4th Generation: NONREACTIVE

## 2014-09-29 NOTE — Telephone Encounter (Signed)
Medical Oncology  Discussed by phone with Dr Linus Salmons of ID, who is to see her as new patient on 10-17-14 for the hep C. He is in agreement with RT/ sensitizing CDDP as planned, expects that he will wait to start treatment for hep C until after this is completed.  Godfrey Pick, MD

## 2014-09-29 NOTE — Telephone Encounter (Signed)
Pt had blood work done at the [facility] office yesterday and was given an appmt to see Dr. Linus Salmons on 10/17/2014 @ 920 am

## 2014-09-29 NOTE — Telephone Encounter (Signed)
Patient confirmed appointment canceled for 09/26. Confirmed appointment 10/03.

## 2014-09-29 NOTE — Progress Notes (Unsigned)
Medical Oncology  Radiation oncology schedule now available in EMR, and I have confirmed this with Dr Clabe Seal RN. RT to begin week of Oct 4. Will begin sensitizing CDDP on Oct 3. Notified chemo scheduler now to cancel treatment 9-26. Urgent POF to schedulers to be sure patient is aware.  Godfrey Pick, MD

## 2014-10-02 ENCOUNTER — Ambulatory Visit: Payer: No Typology Code available for payment source

## 2014-10-02 ENCOUNTER — Other Ambulatory Visit: Payer: No Typology Code available for payment source

## 2014-10-03 LAB — HEPATITIS C GENOTYPE

## 2014-10-03 LAB — HEPATITIS A ANTIBODY, TOTAL: Hep A Total Ab: NONREACTIVE

## 2014-10-03 LAB — HEPATITIS B CORE ANTIBODY, TOTAL: HEP B C TOTAL AB: REACTIVE — AB

## 2014-10-03 LAB — ANA: ANA: NEGATIVE

## 2014-10-03 NOTE — Progress Notes (Signed)
  Radiation Oncology         [phone] ________________________________  Name: Kathy Little MRN: [id number]  Date: 09/28/2014  DOB: 22-May-1960  SIMULATION AND TREATMENT PLANNING NOTE    ICD-9-CM ICD-10-CM   1. 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  Identity was confirmed.  All relevant records and images related to the planned course of therapy were reviewed.  The patient freely provided informed written consent to proceed with treatment after reviewing the details related to the planned course of therapy. The consent form was witnessed and verified by the simulation staff.  Then, the patient was set-up in a stable reproducible  supine position for radiation therapy.  IV contrast was administered as well as rectal contrast and contrast within the vaginal vault CT images were obtained.  Surface markings were placed.  The CT images were loaded into the planning software.  Then the target and avoidance structures were contoured.  Treatment planning then occurred.  The radiation prescription was entered and confirmed.  Then, I designed and supervised the construction of a total of 1 medically necessary complex treatment devices.  I have requested : Intensity Modulated Radiotherapy (IMRT) is medically necessary for this case for the following reason:  Small bowel sparing..  I have ordered:dose calc.  PLAN:  The patient will receive 45 Gy in 25 fractions followed by brachytherapy treatments directed at the proximal vagina.  ________________________________   Special treatment procedure note  Patient will be receiving weekly radiosensitizing chemotherapy during the course of her external beam treatments. Given the increased potential for toxicities as well as the necessity for close monitoring of the  patient and bloodwork and the fact the patient is receiving brachytherapy treatments, this constitutes a special treatment procedure. -----------------------------------  Blair Promise, PhD, MD

## 2014-10-04 ENCOUNTER — Other Ambulatory Visit: Payer: Self-pay | Admitting: Oncology

## 2014-10-04 DIAGNOSIS — Z510 Encounter for antineoplastic radiation therapy: Secondary | ICD-10-CM | POA: Diagnosis not present

## 2014-10-06 DIAGNOSIS — Z510 Encounter for antineoplastic radiation therapy: Secondary | ICD-10-CM | POA: Diagnosis not present

## 2014-10-09 ENCOUNTER — Ambulatory Visit (HOSPITAL_BASED_OUTPATIENT_CLINIC_OR_DEPARTMENT_OTHER): Payer: No Typology Code available for payment source | Admitting: Oncology

## 2014-10-09 ENCOUNTER — Ambulatory Visit: Payer: No Typology Code available for payment source

## 2014-10-09 ENCOUNTER — Other Ambulatory Visit (HOSPITAL_BASED_OUTPATIENT_CLINIC_OR_DEPARTMENT_OTHER): Payer: No Typology Code available for payment source

## 2014-10-09 ENCOUNTER — Encounter: Payer: Self-pay | Admitting: Oncology

## 2014-10-09 VITALS — BP 169/83 | HR 59 | Temp 98.3°F | Resp 16 | Ht 68.0 in | Wt 166.7 lb

## 2014-10-09 DIAGNOSIS — B182 Chronic viral hepatitis C: Secondary | ICD-10-CM

## 2014-10-09 DIAGNOSIS — C801 Malignant (primary) neoplasm, unspecified: Secondary | ICD-10-CM

## 2014-10-09 DIAGNOSIS — D696 Thrombocytopenia, unspecified: Secondary | ICD-10-CM

## 2014-10-09 DIAGNOSIS — C539 Malignant neoplasm of cervix uteri, unspecified: Secondary | ICD-10-CM

## 2014-10-09 DIAGNOSIS — K635 Polyp of colon: Secondary | ICD-10-CM | POA: Diagnosis not present

## 2014-10-09 DIAGNOSIS — Z800 Family history of malignant neoplasm of digestive organs: Secondary | ICD-10-CM

## 2014-10-09 LAB — CBC WITH DIFFERENTIAL/PLATELET
BASO%: 0.3 % (ref 0.0–2.0)
Basophils Absolute: 0 10*3/uL (ref 0.0–0.1)
EOS ABS: 0.2 10*3/uL (ref 0.0–0.5)
EOS%: 6 % (ref 0.0–7.0)
HCT: 45.7 % (ref 34.8–46.6)
HGB: 15.9 g/dL (ref 11.6–15.9)
LYMPH%: 37 % (ref 14.0–49.7)
MCH: 32.6 pg (ref 25.1–34.0)
MCHC: 34.8 g/dL (ref 31.5–36.0)
MCV: 93.6 fL (ref 79.5–101.0)
MONO#: 0.3 10*3/uL (ref 0.1–0.9)
MONO%: 7.1 % (ref 0.0–14.0)
NEUT%: 49.6 % (ref 38.4–76.8)
NEUTROS ABS: 1.8 10*3/uL (ref 1.5–6.5)
PLATELETS: 85 10*3/uL — AB (ref 145–400)
RBC: 4.88 10*6/uL (ref 3.70–5.45)
RDW: 12.2 % (ref 11.2–14.5)
WBC: 3.7 10*3/uL — AB (ref 3.9–10.3)
lymph#: 1.4 10*3/uL (ref 0.9–3.3)
nRBC: 0 % (ref 0–0)

## 2014-10-09 LAB — COMPREHENSIVE METABOLIC PANEL (CC13)
ALBUMIN: 3.7 g/dL (ref 3.5–5.0)
ALK PHOS: 152 U/L — AB (ref 40–150)
ALT: 109 U/L — AB (ref 0–55)
AST: 106 U/L — ABNORMAL HIGH (ref 5–34)
Anion Gap: 6 mEq/L (ref 3–11)
BUN: 15.7 mg/dL (ref 7.0–26.0)
CALCIUM: 9.3 mg/dL (ref 8.4–10.4)
CHLORIDE: 110 meq/L — AB (ref 98–109)
CO2: 26 mEq/L (ref 22–29)
CREATININE: 0.8 mg/dL (ref 0.6–1.1)
EGFR: 83 mL/min/{1.73_m2} — ABNORMAL LOW (ref >90)
Glucose: 110 mg/dl (ref 70–140)
Potassium: 4.2 mEq/L (ref 3.5–5.1)
Sodium: 142 mEq/L (ref 136–145)
Total Bilirubin: 0.57 mg/dL (ref 0.20–1.20)
Total Protein: 7.2 g/dL (ref 6.4–8.3)

## 2014-10-09 LAB — MAGNESIUM (CC13): Magnesium: 2.1 mg/dl (ref 1.5–2.5)

## 2014-10-09 NOTE — Progress Notes (Signed)
OFFICE PROGRESS NOTE   October 09, 2014   Physicians: Everitt Amber, Gery Pray, Earnstine Regal. 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 However platelets today are 85K thought related to chronic Hepatitis C and mild splenomegaly, such that the CDDP will be held.   Patient was discussed at [facility] conference on 10-02-14, including what appears to be superficial disease and concern about blood counts from baseline labs. Dr Denman George feels that radiation alone should be adequate treatment if unable to give concomitant sensitizing chemotherapy.   Patient has felt well since she was seen last, with no vaginal or other bleeding, no pelvic discomfort, no problems with bowels or bladder, no LE swelling. She did do all of oral prehydration in anticipation of chemotherapy today.   She is to see Dr Linus Salmons as new patient in hepatitis clinic on 10-17-14. I have discussed situation with him by phone and additional labs requested by that clinic have been drawn here. Dr Linus Salmons was in agreement with using sensitizing chemotherapy, as CDDP is not metabolized in liver, and thought likely that treatment for the Hep C would begin after oncology treatment completes.   I do not believe that we have gotten PCP established as yet.  No central catheter Flu vaccine given 09-15-14.   Transportation is difficult, labs on days other than chemo need to be coordinated with RT visits if possible.  ONCOLOGIC HISTORY Patient has history of hysterectomy BSO in [place] 2012 for cervical dysplasia Patient moved to [place] from [place] 2 years ago and had not established with physicians in [place] prior to recently noting 3 weeks of brownish vaginal discharge with some spotting. 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 Ezam by Dr Denman George was significant for verrucous lesions upper half of vagina but no masses or abnormal feeling vaginal mucosa and no paravaginal extension on exam. She had consultation with Dr Sondra Come on 09-13-14. PET done earlier day of this consultation (09-15-14) has no evidence of metastatic disease. Recommendation per my discussion with Dr Denman George now are for radiation with sensitizing CDDP in preference to vaginectomy, given her young age and apparent superficial involvement of the invasive cancer.     Review of systems as above, also: No fever. No SOB or cough. Appetite good.  Remainder of 10 point Review of Systems negative.  Objective:  Vital signs in last 24 hours:  BP 169/83 mmHg  Pulse 59  Temp(Src) 98.3 F (36.8 C) (Oral)  Resp 16  Ht 5' 8"  (1.727 m)  Wt 166 lb 11.2 oz (75.615 kg)  BMI 25.35 kg/m2  weight stable Alert, oriented and appropriate. Ambulatory without difficulty. Looks comfortable, respirations not labored, very pleasant as always   HEENT:PERRL, sclerae not icteric. Oral mucosa moist without lesions, posterior pharynx clear.  Lymphatics:no cervical,supraclavicular adenopathy Resp: clear to auscultation bilaterally and normal percussion bilaterally Cardio: regular rate and rhythm. No gallop. GI: soft, nontender, not distended, no mass or organomegaly including no palpable spleen. Normally active bowel sounds.  Musculoskeletal/ Extremities: without pitting edema, cords, tenderness Neuro: no peripheral neuropathy. Otherwise nonfocal Skin  without rash, ecchymosis, petechiae  Portacath-without erythema or tenderness  Lab Results:  Results for orders placed or performed in visit on 10/09/14  Comprehensive metabolic panel  (Cmet) - [facility]  Result Value Ref Range   Sodium 142 136 - 145 mEq/L   Potassium 4.2 3.5 - 5.1 mEq/L   Chloride 110 (H) 98 - 109 mEq/L   CO2 26 22 - 29 mEq/L   Glucose 110 70 - 140 mg/dl   BUN 15.7 7.0 - 26.0 mg/dL   Creatinine 0.8 0.6 - 1.1 mg/dL   Total Bilirubin 0.57 0.20 - 1.20 mg/dL   Alkaline Phosphatase 152 (H) 40 - 150 U/L   AST 106 (H) 5 - 34 U/L   ALT 109 (H) 0 - 55 U/L   Total Protein 7.2 6.4 - 8.3 g/dL   Albumin 3.7 3.5 - 5.0 g/dL   Calcium 9.3 8.4 - 10.4 mg/dL   Anion Gap 6 3 - 11 mEq/L   EGFR 83 (L) >90 ml/min/1.73 m2  CBC with Differential  Result Value Ref Range   WBC 3.7 (L) 3.9 - 10.3 10e3/uL   NEUT# 1.8 1.5 - 6.5 10e3/uL   HGB 15.9 11.6 - 15.9 g/dL   HCT 45.7 34.8 - 46.6 %   Platelets 85 (L) 145 - 400 10e3/uL   MCV 93.6 79.5 - 101.0 fL   MCH 32.6 25.1 - 34.0 pg   MCHC 34.8 31.5 - 36.0 g/dL   RBC 4.88 3.70 - 5.45 10e6/uL   RDW 12.2 11.2 - 14.5 %   lymph# 1.4 0.9 - 3.3 10e3/uL   MONO# 0.3 0.1 - 0.9 10e3/uL   Eosinophils Absolute 0.2 0.0 - 0.5 10e3/uL   Basophils Absolute 0.0 0.0 - 0.1 10e3/uL   NEUT% 49.6 38.4 - 76.8 %   LYMPH% 37.0 14.0 - 49.7 %   MONO% 7.1 0.0 - 14.0 %   EOS% 6.0 0.0 - 7.0 %   BASO% 0.3 0.0 - 2.0 %   nRBC 0 0 - 0 %  Magnesium  Result Value Ref Range   Magnesium 2.1 1.5 - 2.5 mg/dl     Studies/Results:  No results found.  Medications: I have reviewed the patient's current medications.  DISCUSSION: discussion and recommendations from multidisciplinary conference reviewed with patient, including concerns about giving sensitizing CDDP with preexisting low counts. We have decided to cancel chemotherapy also on 10-16-14, but will recheck counts towards end of that week in case improved to allow chemo on 10-23-14. She is instructed to call if significant bleeding. She will keep ID visit as planned.  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 Recommendation for definitive radiation with sensitizing CDDP weekly, tho radiation alone may well be adequate for this involvement, which is fortunate due to low counts baseline. Hold chemo today and 10-16-14, recheck ~ 10-14 and treat 10-17 if platelets at least ~100k 2.chronic hepatitis C: documented in 2014 per medical records obtained now. To be seen at hepatitis clinic next week. Dr Comer's assistance much appreciated 3.post laparoscopic total hysterectomy BSO in [place].  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 She understands that we may need to hold or adjust even this low dose CDDP depending on counts. Hemoglobin upper normal likely with long tobacco, follow. Encouraged good hydration. 5.vaginal warts, high risk HPV 6.history from patient of 5 colon polyps at colonoscopy 2013, recommendation for  repeat "in 8 years" which may not be optimal. Family history colon cancer in mother in 75s. Will need consultation with GI when possible 7.flu vaccine given 09-15-14 8. Long but minimal tobacco, now [facility].  9.amputation of fingers right hand age 63 after trauma then severe frostbite 10.cervical spine orthopedic surgery 11.coronary artery calcifications on imaging. She needs to establish primary care, referral made to internal medicine at earlier appointment, not yet set up (?). Other cardiology evaluation as appropriate per PCP   All questions answered and patient is in agreement with recommendations and plans. Time spent 20 min including >50% counseling and coordination of care. Cc Drs Denman George, Kinard, Comer   Gordy Levan, MD   10/09/2014, 3:58 PM

## 2014-10-10 ENCOUNTER — Ambulatory Visit
Admission: RE | Admit: 2014-10-10 | Discharge: 2014-10-10 | Disposition: A | Discharge status: Home / Self Care | Payer: No Typology Code available for payment source | Source: Ambulatory Visit | Attending: Radiation Oncology | Admitting: Radiation Oncology

## 2014-10-10 ENCOUNTER — Encounter: Payer: Self-pay | Admitting: Radiation Oncology

## 2014-10-10 VITALS — BP 165/99 | HR 70 | Temp 97.6°F | Resp 20 | Ht 68.0 in | Wt 168.6 lb

## 2014-10-10 DIAGNOSIS — Z510 Encounter for antineoplastic radiation therapy: Secondary | ICD-10-CM | POA: Diagnosis not present

## 2014-10-10 DIAGNOSIS — C52 Malignant neoplasm of vagina: Secondary | ICD-10-CM

## 2014-10-10 NOTE — Progress Notes (Signed)
Kathy Little has completed 1 fraction to her pelvis.  Kathy Little does not have any complaints at this time.  Kathy Little was supposed to start chemotherapy yesterday but it was canceled due to her platelets being low at 85.  Kathy Little will now start chemo in 2 weeks.  BP 165/99 mmHg  Pulse 70  Temp(Src) 97.6 F (36.4 C) (Oral)  Resp 20  Ht 5\' 8"  (1.727 m)  Wt 168 lb 9.6 oz (76.476 kg)  BMI 25.64 kg/m2

## 2014-10-10 NOTE — Progress Notes (Signed)
Weekly Management Note Current Dose: 1.8 Gy  Projected Dose: 45Gy + brachytherapy treatments directed at the proximal vagina.  Narrative:  The patient presents for routine under treatment assessment.  CBCT/MVCT images/Port film x-rays were reviewed.  The chart was checked. Kathy Little has completed 1 fraction to her pelvis. She does not have any complaints at this time. She was supposed to start chemotherapy yesterday, but it was canceled due to her platelets being low at 85. She will now start chemo in 2 weeks.  Physical Findings:  Unchanged. Alert and oriented x 3.  Vitals:  Filed Vitals:   10/10/14 1638  BP: 165/99  Pulse: 70  Temp: 97.6 F (36.4 C)  Resp: 20   Weight:  Wt Readings from Last 3 Encounters:  10/10/14 168 lb 9.6 oz (76.476 kg)  10/09/14 166 lb 11.2 oz (75.615 kg)  09/28/14 166 lb 14.4 oz (75.705 kg)   Lab Results  Component Value Date   WBC 3.7* 10/09/2014   HGB 15.9 10/09/2014   HCT 45.7 10/09/2014   MCV 93.6 10/09/2014   PLT 85* 10/09/2014   Lab Results  Component Value Date   CREATININE 0.8 10/09/2014   BUN 15.7 10/09/2014   NA 142 10/09/2014   K 4.2 10/09/2014   CO2 26 10/09/2014    Impression:  The patient is tolerating radiation.  Plan:  Continue treatment as planned.  This document serves as a record of services personally performed by Thea Silversmith, MD. It was created on her behalf by Darcus Austin, a trained medical scribe. The creation of this record is based on the scribe's personal observations and the provider's statements to them. This document has been checked and approved by the attending provider.

## 2014-10-11 ENCOUNTER — Ambulatory Visit
Admission: RE | Admit: 2014-10-11 | Discharge: 2014-10-11 | Disposition: A | Discharge status: Home / Self Care | Payer: No Typology Code available for payment source | Source: Ambulatory Visit | Attending: Radiation Oncology | Admitting: Radiation Oncology

## 2014-10-11 ENCOUNTER — Other Ambulatory Visit: Payer: Self-pay

## 2014-10-11 ENCOUNTER — Telehealth: Payer: Self-pay

## 2014-10-11 DIAGNOSIS — Z510 Encounter for antineoplastic radiation therapy: Secondary | ICD-10-CM | POA: Diagnosis not present

## 2014-10-11 NOTE — Telephone Encounter (Addendum)
Kathy Little can come in for lab on Thursday 10-19-14 at 1345.  She will then stay for radiation at 4 pm.   Appointment scheduled.

## 2014-10-12 ENCOUNTER — Ambulatory Visit
Admission: RE | Admit: 2014-10-12 | Discharge: 2014-10-12 | Disposition: A | Discharge status: Home / Self Care | Payer: No Typology Code available for payment source | Source: Ambulatory Visit | Attending: Radiation Oncology | Admitting: Radiation Oncology

## 2014-10-12 DIAGNOSIS — Z510 Encounter for antineoplastic radiation therapy: Secondary | ICD-10-CM | POA: Diagnosis not present

## 2014-10-13 ENCOUNTER — Ambulatory Visit
Admission: RE | Admit: 2014-10-13 | Discharge: 2014-10-13 | Disposition: A | Discharge status: Home / Self Care | Payer: No Typology Code available for payment source | Source: Ambulatory Visit | Attending: Radiation Oncology | Admitting: Radiation Oncology

## 2014-10-13 DIAGNOSIS — Z510 Encounter for antineoplastic radiation therapy: Secondary | ICD-10-CM | POA: Diagnosis not present

## 2014-10-14 ENCOUNTER — Ambulatory Visit: Payer: No Typology Code available for payment source

## 2014-10-15 ENCOUNTER — Ambulatory Visit: Payer: No Typology Code available for payment source

## 2014-10-16 ENCOUNTER — Ambulatory Visit: Payer: No Typology Code available for payment source

## 2014-10-16 ENCOUNTER — Other Ambulatory Visit: Payer: No Typology Code available for payment source

## 2014-10-16 ENCOUNTER — Ambulatory Visit
Admission: RE | Admit: 2014-10-16 | Discharge: 2014-10-16 | Disposition: A | Discharge status: Home / Self Care | Payer: No Typology Code available for payment source | Source: Ambulatory Visit | Attending: Radiation Oncology | Admitting: Radiation Oncology

## 2014-10-16 ENCOUNTER — Ambulatory Visit: Payer: No Typology Code available for payment source | Admitting: Oncology

## 2014-10-16 DIAGNOSIS — Z510 Encounter for antineoplastic radiation therapy: Secondary | ICD-10-CM | POA: Diagnosis not present

## 2014-10-17 ENCOUNTER — Ambulatory Visit
Admission: RE | Admit: 2014-10-17 | Discharge: 2014-10-17 | Disposition: A | Discharge status: Home / Self Care | Payer: No Typology Code available for payment source | Source: Ambulatory Visit | Attending: Radiation Oncology | Admitting: Radiation Oncology

## 2014-10-17 ENCOUNTER — Ambulatory Visit (INDEPENDENT_AMBULATORY_CARE_PROVIDER_SITE_OTHER): Payer: No Typology Code available for payment source | Admitting: Internal Medicine

## 2014-10-17 ENCOUNTER — Encounter: Payer: Self-pay | Admitting: Internal Medicine

## 2014-10-17 ENCOUNTER — Encounter: Payer: Self-pay | Admitting: Radiation Oncology

## 2014-10-17 ENCOUNTER — Ambulatory Visit: Payer: No Typology Code available for payment source

## 2014-10-17 VITALS — BP 165/100 | HR 67 | Temp 97.8°F | Ht 68.0 in | Wt 167.5 lb

## 2014-10-17 VITALS — BP 151/90 | HR 67 | Temp 98.1°F | Ht 68.0 in | Wt 167.0 lb

## 2014-10-17 DIAGNOSIS — K7290 Hepatic failure, unspecified without coma: Secondary | ICD-10-CM | POA: Insufficient documentation

## 2014-10-17 DIAGNOSIS — B1920 Unspecified viral hepatitis C without hepatic coma: Secondary | ICD-10-CM | POA: Insufficient documentation

## 2014-10-17 DIAGNOSIS — C539 Malignant neoplasm of cervix uteri, unspecified: Secondary | ICD-10-CM

## 2014-10-17 DIAGNOSIS — B182 Chronic viral hepatitis C: Secondary | ICD-10-CM | POA: Diagnosis not present

## 2014-10-17 DIAGNOSIS — Z510 Encounter for antineoplastic radiation therapy: Secondary | ICD-10-CM | POA: Diagnosis not present

## 2014-10-17 NOTE — Progress Notes (Addendum)
Kathy Little has completed 6 fractions to her pelvis.  She denies pain.  She did report that she fell at home today after tripping over her shoes.  She said she banged her nose and may have a black eye.  She said she did not loose consciousness and doesn't have a headache.  She denies having any bladder/bowel issues or vaginal/rectal bleeding.  She reports having gas.  She also reports having nausea once last Friday.  Advised her to take zofran as needed.  She reports slight fatigue.  She saw Dr. Linus Salmons today and will be treated for her liver issues in January.  Her bp was elevated today and she will see her primary care doctor on Wednesday.  BP 165/100 mmHg  Pulse 67  Temp(Src) 97.8 F (36.6 C) (Oral)  Ht 5\' 8"  (1.727 m)  Wt 167 lb 8 oz (75.978 kg)  BMI 25.47 kg/m2

## 2014-10-17 NOTE — Patient Instructions (Signed)
Date 10/17/2014  Dear Ms Carolynn Serve, As discussed in the [facility], your hepatitis C therapy will include the following medications:          Harvoni 90mg /400mg  tablet:           Take 1 tablet by mouth once daily                  OR     Epclusa  Please note that ALL MEDICATIONS WILL START ON THE SAME DATE for a total of 12 or 24 weeks. ---------------------------------------------------------------- Your HCV Treatment Start Date: TBA   Your HCV genotype:  1a    Liver Fibrosis: TBD    ---------------------------------------------------------------- YOUR PHARMACY CONTACT:   [place] [facility] [place] Phone: [phone] Hours: Monday to Friday 7:30 am to 6:00 pm   Please always contact your pharmacy at least 3-4 business days before you run out of medications to ensure your next month's medication is ready or 1 week prior to running out if you receive it by mail.  Remember, each prescription is for 28 days. ---------------------------------------------------------------- GENERAL NOTES REGARDING YOUR HEPATITIS C MEDICATION:  SOFOSBUVIR/LEDIPASVIR (HARVONI): - Harvoni tablet is taken daily with OR without food. - The tablets are orange. - The tablets should be stored at room temperature.  - Acid reducing agents such as H2 blockers (ie. Pepcid (famotidine), Zantac (ranitidine), Tagamet (cimetidine), Axid (nizatidine) and proton pump inhibitors (ie. Prilosec (omeprazole), Protonix (pantoprazole), Nexium (esomeprazole), or Aciphex (rabeprazole)) can decrease effectiveness of Harvoni. Do not take until you have discussed with a health care provider.    -Antacids that contain magnesium and/or aluminum hydroxide (ie. Milk of Magensia, Rolaids, Gaviscon, Maalox, Mylanta, an dArthritis Pain Formula)can reduce absorption of Harvoni, so take them at least 4 hours before or after Harvoni.  -Calcium carbonate (calcium supplements  or antacids such as Tums, Caltrate, Os-Cal)needs to be taken at least 4 hours hours before or after Harvoni.  -St. John's wort or any products that contain St. John's wort like some herbal supplements  Please inform the office prior to starting any of these medications.  - The common side effects associated with Harvoni include:      1. Fatigue      2. Headache      3. Nausea      4. Diarrhea      5. Insomnia   Support Path is a suite of resources designed to help patients start with HARVONI and move toward treatment completion [place] helps patients access therapy and get off to an efficient start  Benefits investigation and prior authorization support Co-pay and other financial assistance A specialty pharmacy finder CO-PAY COUPON The [place] co-pay coupon may help eligible patients lower their out-of-pocket costs. With a co-pay coupon, most eligible patients may pay no more than $5 per co-pay (restrictions apply) www.harvoni.com call (939)069-5912 Not valid for patients enrolled in government healthcare prescription drug programs, such as Medicare Part D and Medicaid. Patients in the coverage gap known as the "donut hole" also are not eligible The HARVONI co-pay coupon program will cover the out-of-pocket costs for HARVONI prescriptions up to a maximum of 25% of the catalog price of a 12-week regimen of HARVONI  Please note that this only lists the most common side effects and is NOT a comprehensive list of the potential side effects of these medications. For more information, please review the drug information sheets that come with your  medication package from the pharmacy.  ---------------------------------------------------------------- GENERAL HELPFUL HINTS ON HCV THERAPY: 1. No alcohol. 2. Protect against sun-sensitivity/sunburns (wear sunglasses, hat, long sleeves, pants and sunscreen). 3. Stay well-hydrated/well-moisturized. 4. Notify the [facility] of  any changes in your other over-the-counter/herbal or prescription medications. 5. If you miss a dose of your medication, take the missed dose as soon as you remember. Return to your regular time/dose schedule the next day.  6.  Do not stop taking your medications without first talking with your healthcare provider. 7.  You may take Tylenol (acetaminophen), as long as the dose is less than 2000 mg (OR no more than 4 tablets of the Tylenol Extra Strengths 500mg  tablet) in 24 hours. 8.  You will need to obtain routine labs and/or office visits at [facility] at weeks 4 and 12 as well as 12 and 24 weeks after completion of treatment.   Scharlene Gloss, [place] [facility] [address] [place], [place]  [phone]

## 2014-10-17 NOTE — Progress Notes (Signed)
[facility]   HPI:  +Kathy Little is a 54 y.o. female who presents for initial evaluation and management of chronic hepatitis C.  Patient tested positive in th 1990s. Hepatitis C risk factors present are: none. Patient denies IV drug abuse, sexual contact with person with liver disease, tattoos. Patient has had other studies performed. Results: hepatitis C RNA by PCR, result: positive. Patient has had prior treatment for Hepatitis C. Patient does not have a past history of liver disease. Patient does not have a family history of liver disease.  She was treated in the 1990s with 3 times a week interferon for 48 weeks but did not achieve SVR.  No treatment since.  Is currenlty undregoing radiation treatment with Dr. Marko Plume, finished chemo.       Patient does not have documented immunity to Hepatitis A. Patient does not have documented immunity to Hepatitis B.    Review of Systems:  Constitutional: Negative for fatigue, weight loss.  HENT: Negative for hearing loss, ear pain, neck pain, tinnitus and ear discharge.  Eyes: Negative for icterus, discharge and redness.  Respiratory: Negative fordypsnea, wheezing.  Cardiovascular: Negative for chest pain, palpitations, orthopnea, claudication and leg swelling.  Gastrointestinal: Negative for nausea, vomiting, abdominal distention and abdominal pain.Negative for  Genitourinary: Negative for dysuria, urgency, frequency, hematuria and flank pain.  Musculoskeletal: Negative for arthralgias, arthritis Skin: Negative for itching and rash. Neurological: Negative for dizziness and weakness. Endo/Heme/Allergies: Negative for environmental allergies and polydipsia. Does not bruise/bleed easily.      Past Medical History  Diagnosis Date  . 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 Authorizing Provider  diphenhydrAMINE (BENADRYL ALLERGY) 25 mg capsule Take 25  mg by mouth every 6 (six) hours as needed.   Yes Historical Provider, MD  loratadine (CLARITIN) 10 MG tablet Take 1 tablet (10 mg total) by mouth daily. 09/28/14  Yes Lennis Marion Downer, MD  LORazepam (ATIVAN) 1 MG tablet Place 1/2 to 1 tablet under the tongue or swallow every 6 hours as needed for nausea. Will make drowsy 09/15/14  Yes Lennis Marion Downer, MD  naproxen sodium (ANAPROX) 220 MG tablet Take 440 mg by mouth at bedtime.   Yes Historical Provider, MD  ondansetron (ZOFRAN) 8 MG tablet Take 1 tablet (8 mg total) by mouth every 8 (eight) hours as needed for nausea or vomiting. Will not make drowsy 09/15/14  Yes Lennis Marion Downer, MD    Allergies  Allergen Reactions  . Flagyl [Metronidazole] Nausea Only    Social History  Substance Use Topics  . Smoking status: Former Smoker -- 0.25 packs/day    Quit date: 08/28/2014  . Smokeless tobacco: Never Used  . Alcohol Use: 0.0 oz/week    0 Standard drinks or equivalent per week     Comment: occasional beer    Family History  Problem Relation Age of Onset  . Colon cancer Mother   MGF with pancreatic cancer   Objective:   Filed Vitals:   10/17/14 0924  BP: 151/90  Pulse: 67  Temp: 98.1 F (36.7 C)   GEN: in no apparent distress and alert HEENT: anicteric Cardiac: Cor RRR and No murmurs Lungs: clear Abdomen: Bowel sounds are normal, liver is not enlarged, spleen is not enlarged Ext: peripheral pulses normal, no pedal edema, no clubbing or cyanosis Skin: negative for - jaundice, spider hemangioma, telangiectasia, palmar erythema, ecchymosis and atrophy Musculoskeletal:no  joint swelling  Laboratory Genotype:  Lab Results  Component Value Date   HCVGENOTYPE 1a 09/28/2014   HCV viral load:  Lab Results  Component Value Date   HCVQUANT 1157262* 09/19/2014   Lab Results  Component Value Date   WBC 3.7* 10/09/2014   HGB 15.9 10/09/2014   HCT 45.7 10/09/2014   MCV 93.6 10/09/2014   PLT 85* 10/09/2014    Lab Results    Component Value Date   CREATININE 0.8 10/09/2014   BUN 15.7 10/09/2014   NA 142 10/09/2014   K 4.2 10/09/2014   CO2 26 10/09/2014    Lab Results  Component Value Date   ALT 109* 10/09/2014   AST 106* 10/09/2014   ALKPHOS 152* 10/09/2014     CHILD-PUGH A  5-6 points: Child class A 7-9 points: Child class B 10-15 points: Child class C  Lab Results  Component Value Date   INR 1.00* 09/28/2014   BILITOT 0.57 10/09/2014   ALBUMIN 3.7 10/09/2014     Assessment: Chronic Hepatitis C genotype 1a I discussed with the patient the natural history and progression of chronic hepatitis C infection including about 30% of people who develop cirrhosis of the liver and once cirrhosis is established there is a 2-7% risk per year of liver cancer and liver failure.  I discussed the importance of treatment and benefits in reducing the risk.    Plan: 1) Patient counseled extensively on limiting acetaminophen to no more than 2 grams daily, avoidance of alcohol. 2) Transmission discussed with patient including sexual transmission, sharing razors and toothbrush.   3) Will need referral to gastroenterology if concern for cirrhosis 4) Will need referral for substance abuse counseling: No. 5) Will prescribe Harvoni with riba or Epclusa or zepatier after radiation therapy.  May need NS5A test then.   6) Hepatitis A vaccine Yes.  will do after she completes radiation therapy. 7) Hepatitis B vaccine Yes.   8) Pneumovax vaccine if concern for cirrhosis 9) will follow up after elastography

## 2014-10-17 NOTE — Progress Notes (Signed)
Pt here for patient teaching.  Pt given Radiation and You booklet. Reviewed areas of pertinence such as diarrhea, fatigue, nausea and vomiting, sexual and fertility changes, skin changes and urinary and bladder changes . Pt able to give teach back of to pat skin, use unscented/gentle soap, use baby wipes, have Imodium on hand, drink plenty of water and sitz bath,avoid applying anything to skin within 4 hours of treatment. Pt demonstrated understanding and verbalizes understanding of information given and will contact nursing with any questions or concerns.

## 2014-10-17 NOTE — Progress Notes (Signed)
  Radiation Oncology         [phone] ________________________________  Name: Kathy Little MRN: [id number]  Date: 10/17/2014  DOB: Jul 01, 1960  Weekly Radiation Therapy Management    ICD-9-CM ICD-10-CM   1. Recurrent cervical cancer (HCC) 180.9 C53.9      Current Dose: 10.8 Gy     Planned Dose:  45+ Gy  Narrative . . . . . . . . The patient presents for routine under treatment assessment.                                   The patient is without complaint. Mild fatigue, mild nausea late last week                                 Set-up films were reviewed.                                 The chart was checked. Physical Findings. . .  height is 5\' 8"  (1.727 m) and weight is 167 lb 8 oz (75.978 kg). Her oral temperature is 97.8 F (36.6 C). Her blood pressure is 165/100 and her pulse is 67. . The lungs are clear. The heart has a regular rhythm and rate the abdomen is soft and nontender with normal bowel sounds Impression . . . . . . . The patient is tolerating radiation. Plan . . . . . . . . . . . . Continue treatment as planned.  ________________________________   Blair Promise, PhD, MD

## 2014-10-18 ENCOUNTER — Other Ambulatory Visit: Payer: Self-pay | Admitting: Oncology

## 2014-10-18 ENCOUNTER — Ambulatory Visit
Admission: RE | Admit: 2014-10-18 | Discharge: 2014-10-18 | Disposition: A | Discharge status: Home / Self Care | Payer: No Typology Code available for payment source | Source: Ambulatory Visit | Attending: Radiation Oncology | Admitting: Radiation Oncology

## 2014-10-18 ENCOUNTER — Ambulatory Visit: Payer: No Typology Code available for payment source

## 2014-10-18 DIAGNOSIS — Z510 Encounter for antineoplastic radiation therapy: Secondary | ICD-10-CM | POA: Diagnosis not present

## 2014-10-19 ENCOUNTER — Ambulatory Visit
Admission: RE | Admit: 2014-10-19 | Discharge: 2014-10-19 | Disposition: A | Discharge status: Home / Self Care | Payer: No Typology Code available for payment source | Source: Ambulatory Visit | Attending: Radiation Oncology | Admitting: Radiation Oncology

## 2014-10-19 ENCOUNTER — Other Ambulatory Visit: Payer: Self-pay | Admitting: Oncology

## 2014-10-19 ENCOUNTER — Other Ambulatory Visit (HOSPITAL_BASED_OUTPATIENT_CLINIC_OR_DEPARTMENT_OTHER): Payer: No Typology Code available for payment source

## 2014-10-19 DIAGNOSIS — C539 Malignant neoplasm of cervix uteri, unspecified: Secondary | ICD-10-CM

## 2014-10-19 DIAGNOSIS — C801 Malignant (primary) neoplasm, unspecified: Secondary | ICD-10-CM | POA: Diagnosis not present

## 2014-10-19 DIAGNOSIS — Z510 Encounter for antineoplastic radiation therapy: Secondary | ICD-10-CM | POA: Diagnosis not present

## 2014-10-19 LAB — COMPREHENSIVE METABOLIC PANEL (CC13)
ALT: 111 U/L — AB (ref 0–55)
ANION GAP: 6 meq/L (ref 3–11)
AST: 105 U/L — ABNORMAL HIGH (ref 5–34)
Albumin: 3.8 g/dL (ref 3.5–5.0)
Alkaline Phosphatase: 148 U/L (ref 40–150)
BUN: 14.5 mg/dL (ref 7.0–26.0)
CHLORIDE: 107 meq/L (ref 98–109)
CO2: 30 meq/L — AB (ref 22–29)
Calcium: 9.4 mg/dL (ref 8.4–10.4)
Creatinine: 0.8 mg/dL (ref 0.6–1.1)
EGFR: 88 mL/min/{1.73_m2} — AB (ref >90)
Glucose: 95 mg/dl (ref 70–140)
POTASSIUM: 4.6 meq/L (ref 3.5–5.1)
Sodium: 143 mEq/L (ref 136–145)
Total Bilirubin: 0.68 mg/dL (ref 0.20–1.20)
Total Protein: 7.5 g/dL (ref 6.4–8.3)

## 2014-10-19 LAB — CBC WITH DIFFERENTIAL/PLATELET
BASO%: 0.4 % (ref 0.0–2.0)
BASOS ABS: 0 10*3/uL (ref 0.0–0.1)
EOS ABS: 0.2 10*3/uL (ref 0.0–0.5)
EOS%: 5.9 % (ref 0.0–7.0)
HEMATOCRIT: 45.2 % (ref 34.8–46.6)
HEMOGLOBIN: 15.2 g/dL (ref 11.6–15.9)
LYMPH#: 1 10*3/uL (ref 0.9–3.3)
LYMPH%: 29.5 % (ref 14.0–49.7)
MCH: 32 pg (ref 25.1–34.0)
MCHC: 33.7 g/dL (ref 31.5–36.0)
MCV: 94.8 fL (ref 79.5–101.0)
MONO#: 0.3 10*3/uL (ref 0.1–0.9)
MONO%: 8.3 % (ref 0.0–14.0)
NEUT%: 55.9 % (ref 38.4–76.8)
NEUTROS ABS: 1.9 10*3/uL (ref 1.5–6.5)
Platelets: 77 10*3/uL — ABNORMAL LOW (ref 145–400)
RBC: 4.77 10*6/uL (ref 3.70–5.45)
RDW: 12.2 % (ref 11.2–14.5)
WBC: 3.3 10*3/uL — AB (ref 3.9–10.3)

## 2014-10-20 ENCOUNTER — Telehealth: Payer: Self-pay | Admitting: Oncology

## 2014-10-20 ENCOUNTER — Other Ambulatory Visit: Payer: Self-pay | Admitting: Oncology

## 2014-10-20 ENCOUNTER — Telehealth: Payer: Self-pay | Admitting: Lab

## 2014-10-20 ENCOUNTER — Ambulatory Visit
Admission: RE | Admit: 2014-10-20 | Discharge: 2014-10-20 | Disposition: A | Discharge status: Home / Self Care | Payer: No Typology Code available for payment source | Source: Ambulatory Visit | Attending: Radiation Oncology | Admitting: Radiation Oncology

## 2014-10-20 DIAGNOSIS — Z510 Encounter for antineoplastic radiation therapy: Secondary | ICD-10-CM | POA: Diagnosis not present

## 2014-10-20 NOTE — Telephone Encounter (Signed)
Pt saw Dr Linus Salmons on 10/17/14, not sure what the question was but I LM for Louise or Tammy.  The initial call was sent to me as a staff message from [facility].

## 2014-10-20 NOTE — Telephone Encounter (Signed)
lvm foer pt to get updated sched on monday visit

## 2014-10-23 ENCOUNTER — Other Ambulatory Visit: Payer: No Typology Code available for payment source

## 2014-10-23 ENCOUNTER — Ambulatory Visit
Admission: RE | Admit: 2014-10-23 | Discharge: 2014-10-23 | Disposition: A | Discharge status: Home / Self Care | Payer: No Typology Code available for payment source | Source: Ambulatory Visit | Attending: Radiation Oncology | Admitting: Radiation Oncology

## 2014-10-23 ENCOUNTER — Other Ambulatory Visit (HOSPITAL_BASED_OUTPATIENT_CLINIC_OR_DEPARTMENT_OTHER): Payer: No Typology Code available for payment source

## 2014-10-23 ENCOUNTER — Ambulatory Visit: Payer: No Typology Code available for payment source

## 2014-10-23 ENCOUNTER — Ambulatory Visit: Payer: No Typology Code available for payment source | Admitting: Oncology

## 2014-10-23 ENCOUNTER — Telehealth: Payer: Self-pay

## 2014-10-23 DIAGNOSIS — Z510 Encounter for antineoplastic radiation therapy: Secondary | ICD-10-CM | POA: Diagnosis not present

## 2014-10-23 DIAGNOSIS — C801 Malignant (primary) neoplasm, unspecified: Secondary | ICD-10-CM

## 2014-10-23 DIAGNOSIS — C539 Malignant neoplasm of cervix uteri, unspecified: Secondary | ICD-10-CM

## 2014-10-23 LAB — COMPREHENSIVE METABOLIC PANEL (CC13)
ALBUMIN: 3.7 g/dL (ref 3.5–5.0)
ALK PHOS: 134 U/L (ref 40–150)
ALT: 109 U/L — AB (ref 0–55)
AST: 99 U/L — AB (ref 5–34)
Anion Gap: 6 mEq/L (ref 3–11)
BUN: 11.9 mg/dL (ref 7.0–26.0)
CALCIUM: 9.2 mg/dL (ref 8.4–10.4)
CO2: 29 mEq/L (ref 22–29)
CREATININE: 0.8 mg/dL (ref 0.6–1.1)
Chloride: 106 mEq/L (ref 98–109)
EGFR: 85 mL/min/{1.73_m2} — ABNORMAL LOW (ref >90)
Glucose: 107 mg/dl (ref 70–140)
Potassium: 4.2 mEq/L (ref 3.5–5.1)
Sodium: 141 mEq/L (ref 136–145)
Total Bilirubin: 0.8 mg/dL (ref 0.20–1.20)
Total Protein: 7.3 g/dL (ref 6.4–8.3)

## 2014-10-23 LAB — CBC WITH DIFFERENTIAL/PLATELET
BASO%: 0.3 % (ref 0.0–2.0)
Basophils Absolute: 0 10*3/uL (ref 0.0–0.1)
EOS%: 9.1 % — ABNORMAL HIGH (ref 0.0–7.0)
Eosinophils Absolute: 0.3 10*3/uL (ref 0.0–0.5)
HCT: 44.5 % (ref 34.8–46.6)
HGB: 15.3 g/dL (ref 11.6–15.9)
LYMPH%: 25.3 % (ref 14.0–49.7)
MCH: 32.6 pg (ref 25.1–34.0)
MCHC: 34.4 g/dL (ref 31.5–36.0)
MCV: 94.7 fL (ref 79.5–101.0)
MONO#: 0.3 10*3/uL (ref 0.1–0.9)
MONO%: 10.1 % (ref 0.0–14.0)
NEUT%: 55.2 % (ref 38.4–76.8)
NEUTROS ABS: 1.6 10*3/uL (ref 1.5–6.5)
Platelets: 65 10*3/uL — ABNORMAL LOW (ref 145–400)
RBC: 4.7 10*6/uL (ref 3.70–5.45)
RDW: 12.2 % (ref 11.2–14.5)
WBC: 3 10*3/uL — AB (ref 3.9–10.3)
lymph#: 0.8 10*3/uL — ABNORMAL LOW (ref 0.9–3.3)

## 2014-10-23 NOTE — Telephone Encounter (Signed)
-----   Message from Gordy Levan, MD sent at 10/23/2014  1:14 PM EDT ----- Labs seen and need follow up: please let her know chemistries and blood counts essentially stable, tho platelets just a little lower, not unexpected with radiation. She needs to let this office or radiation oncology know if any bleeding. She needs to avoid ASA and NSAIDs.  thanks

## 2014-10-23 NOTE — Telephone Encounter (Signed)
Reviewed the results of [name]'s labs from 10-23-14 as noted below by Dr. Marko Plume.   [name] verbalized understanding.  She stated that she could not use the naprosyn right now, which is correct.

## 2014-10-24 ENCOUNTER — Ambulatory Visit
Admission: RE | Admit: 2014-10-24 | Discharge: 2014-10-24 | Disposition: A | Discharge status: Home / Self Care | Payer: No Typology Code available for payment source | Source: Ambulatory Visit | Attending: Radiation Oncology | Admitting: Radiation Oncology

## 2014-10-24 ENCOUNTER — Encounter: Payer: Self-pay | Admitting: Radiation Oncology

## 2014-10-24 VITALS — BP 175/96 | HR 67 | Temp 98.6°F | Resp 16 | Wt 163.8 lb

## 2014-10-24 DIAGNOSIS — C539 Malignant neoplasm of cervix uteri, unspecified: Secondary | ICD-10-CM

## 2014-10-24 DIAGNOSIS — Z510 Encounter for antineoplastic radiation therapy: Secondary | ICD-10-CM | POA: Diagnosis not present

## 2014-10-24 NOTE — Progress Notes (Signed)
  Radiation Oncology         [phone]) [phone] ________________________________  Name: Kathy Little MRN: [id number]  Date: 10/24/2014  DOB: 1960/05/08  Weekly Radiation Therapy Management    ICD-9-CM ICD-10-CM   1. Recurrent cervical cancer (HCC) 180.9 C53.9     Current Dose: 19.8 Gy     Planned Dose:  45+ Gy directed at the pelvis area  Narrative . . . . . . . . The patient presents for routine under treatment assessment.                                   She complains of some pain in the epigastric region. She denies any nausea. This pain is not associated with eating. She feels this started when she started taking her nausea medicine.  She will stop taking this since she is not really having any nausea and no chemotherapy is planned at this time. The patient's platelet counts were too low to safely receive chemotherapy. Vaginal bleeding has subsided.                                 Set-up films were reviewed.                                 The chart was checked. Physical Findings. . .  weight is 163 lb 12.8 oz (74.299 kg). Her oral temperature is 98.6 F (37 C). Her blood pressure is 175/96 and her pulse is 67. Her respiration is 16 and oxygen saturation is 100%. . The lungs are clear. The heart has a regular rhythm and rate. The abdomen is soft and nontender with normal bowel sounds. No epigastric tenderness noted with palpation Impression . . . . . . . The patient is tolerating radiation. Plan . . . . . . . . . . . . Continue treatment as planned.  ________________________________   Blair Promise, PhD, MD

## 2014-10-24 NOTE — Progress Notes (Addendum)
4lb weight loss noted. BP elevated. Scheduled to follow up with PCP tomorrow reference elevated BP. Reports a stomach ache with nausea despite taking zofran. Reports taking ativan once for nausea without much relief. Reports diarrhea on Sunday but, regular formed bowel movements since. Denies vaginal odor, itching or discharge. Denies hematuria or dysuria. Reports urinary frequency continues.   BP 175/96 mmHg  Pulse 67  Resp 16  Wt 163 lb 12.8 oz (74.299 kg). Wt Readings from Last 3 Encounters:  10/24/14 163 lb 12.8 oz (74.299 kg)  10/17/14 167 lb 8 oz (75.978 kg)  10/17/14 167 lb (75.751 kg)

## 2014-10-25 ENCOUNTER — Ambulatory Visit
Admission: RE | Admit: 2014-10-25 | Discharge: 2014-10-25 | Disposition: A | Discharge status: Home / Self Care | Payer: No Typology Code available for payment source | Source: Ambulatory Visit | Attending: Radiation Oncology | Admitting: Radiation Oncology

## 2014-10-25 DIAGNOSIS — Z510 Encounter for antineoplastic radiation therapy: Secondary | ICD-10-CM | POA: Diagnosis not present

## 2014-10-26 ENCOUNTER — Ambulatory Visit
Admission: RE | Admit: 2014-10-26 | Discharge: 2014-10-26 | Disposition: A | Discharge status: Home / Self Care | Payer: No Typology Code available for payment source | Source: Ambulatory Visit | Attending: Radiation Oncology | Admitting: Radiation Oncology

## 2014-10-26 DIAGNOSIS — Z510 Encounter for antineoplastic radiation therapy: Secondary | ICD-10-CM | POA: Diagnosis not present

## 2014-10-27 ENCOUNTER — Telehealth: Payer: Self-pay

## 2014-10-27 ENCOUNTER — Ambulatory Visit
Admission: RE | Admit: 2014-10-27 | Discharge: 2014-10-27 | Disposition: A | Discharge status: Home / Self Care | Payer: No Typology Code available for payment source | Source: Ambulatory Visit | Attending: Radiation Oncology | Admitting: Radiation Oncology

## 2014-10-27 DIAGNOSIS — Z510 Encounter for antineoplastic radiation therapy: Secondary | ICD-10-CM | POA: Diagnosis not present

## 2014-10-27 NOTE — Telephone Encounter (Signed)
Told Kathy Little that Dr. Marko Plume said that it is fine for her to take the Losartan and Tramadol as noted below.

## 2014-10-27 NOTE — Telephone Encounter (Signed)
Kathy Little called stating that her PCP gave her Losartan 50 mg daily for BP and Tramadol 50 mg prn for neck pain.  If these meds are not ok  for her to take, just call her back at [phone].

## 2014-10-30 ENCOUNTER — Ambulatory Visit: Payer: No Typology Code available for payment source

## 2014-10-30 ENCOUNTER — Other Ambulatory Visit (HOSPITAL_BASED_OUTPATIENT_CLINIC_OR_DEPARTMENT_OTHER): Payer: No Typology Code available for payment source

## 2014-10-30 ENCOUNTER — Ambulatory Visit
Admission: RE | Admit: 2014-10-30 | Discharge: 2014-10-30 | Disposition: A | Discharge status: Home / Self Care | Payer: No Typology Code available for payment source | Source: Ambulatory Visit | Attending: Radiation Oncology | Admitting: Radiation Oncology

## 2014-10-30 ENCOUNTER — Other Ambulatory Visit: Payer: No Typology Code available for payment source

## 2014-10-30 DIAGNOSIS — C801 Malignant (primary) neoplasm, unspecified: Secondary | ICD-10-CM

## 2014-10-30 DIAGNOSIS — Z510 Encounter for antineoplastic radiation therapy: Secondary | ICD-10-CM | POA: Diagnosis not present

## 2014-10-30 DIAGNOSIS — C539 Malignant neoplasm of cervix uteri, unspecified: Secondary | ICD-10-CM

## 2014-10-30 LAB — COMPREHENSIVE METABOLIC PANEL (CC13)
ALBUMIN: 3.7 g/dL (ref 3.5–5.0)
ALK PHOS: 129 U/L (ref 40–150)
ALT: 103 U/L — AB (ref 0–55)
ANION GAP: 6 meq/L (ref 3–11)
AST: 91 U/L — ABNORMAL HIGH (ref 5–34)
BILIRUBIN TOTAL: 0.82 mg/dL (ref 0.20–1.20)
BUN: 14.4 mg/dL (ref 7.0–26.0)
CO2: 29 meq/L (ref 22–29)
CREATININE: 0.8 mg/dL (ref 0.6–1.1)
Calcium: 9.6 mg/dL (ref 8.4–10.4)
Chloride: 106 mEq/L (ref 98–109)
EGFR: 88 mL/min/{1.73_m2} — AB (ref >90)
Glucose: 81 mg/dl (ref 70–140)
Potassium: 4.3 mEq/L (ref 3.5–5.1)
Sodium: 141 mEq/L (ref 136–145)
TOTAL PROTEIN: 7.4 g/dL (ref 6.4–8.3)

## 2014-10-30 LAB — CBC WITH DIFFERENTIAL/PLATELET
BASO%: 0.8 % (ref 0.0–2.0)
BASOS ABS: 0 10*3/uL (ref 0.0–0.1)
EOS%: 17 % — AB (ref 0.0–7.0)
Eosinophils Absolute: 0.5 10*3/uL (ref 0.0–0.5)
HEMATOCRIT: 44.1 % (ref 34.8–46.6)
HGB: 15.2 g/dL (ref 11.6–15.9)
LYMPH#: 0.6 10*3/uL — AB (ref 0.9–3.3)
LYMPH%: 20.2 % (ref 14.0–49.7)
MCH: 32.3 pg (ref 25.1–34.0)
MCHC: 34.4 g/dL (ref 31.5–36.0)
MCV: 94.1 fL (ref 79.5–101.0)
MONO#: 0.3 10*3/uL (ref 0.1–0.9)
MONO%: 10.6 % (ref 0.0–14.0)
NEUT#: 1.5 10*3/uL (ref 1.5–6.5)
NEUT%: 51.4 % (ref 38.4–76.8)
Platelets: 79 10*3/uL — ABNORMAL LOW (ref 145–400)
RBC: 4.69 10*6/uL (ref 3.70–5.45)
RDW: 12 % (ref 11.2–14.5)
WBC: 2.9 10*3/uL — ABNORMAL LOW (ref 3.9–10.3)

## 2014-10-31 ENCOUNTER — Ambulatory Visit
Admission: RE | Admit: 2014-10-31 | Discharge: 2014-10-31 | Disposition: A | Discharge status: Home / Self Care | Payer: No Typology Code available for payment source | Source: Ambulatory Visit | Attending: Radiation Oncology | Admitting: Radiation Oncology

## 2014-10-31 ENCOUNTER — Encounter: Payer: Self-pay | Admitting: Radiation Oncology

## 2014-10-31 ENCOUNTER — Telehealth: Payer: Self-pay

## 2014-10-31 VITALS — BP 156/87 | HR 64 | Temp 98.2°F | Resp 18 | Ht 68.0 in | Wt 165.9 lb

## 2014-10-31 DIAGNOSIS — C539 Malignant neoplasm of cervix uteri, unspecified: Secondary | ICD-10-CM

## 2014-10-31 DIAGNOSIS — Z510 Encounter for antineoplastic radiation therapy: Secondary | ICD-10-CM | POA: Diagnosis not present

## 2014-10-31 NOTE — Progress Notes (Signed)
Kathy Little has completed 16 fractions to her pelvis.  She denies pain, nausea, bladder issues, vaginal/rectal bleeding and skin irritation.  She reports having an occasional soft bowel movements and usually has 2 a day.  She reports having fatigue.    BP 156/87 mmHg  Pulse 64  Temp(Src) 98.2 F (36.8 C) (Oral)  Resp 18  Ht 5\' 8"  (1.727 m)  Wt 165 lb 14.4 oz (75.252 kg)  BMI 25.23 kg/m2

## 2014-10-31 NOTE — Progress Notes (Signed)
  Radiation Oncology         [phone] ________________________________  Name: Kathy Little MRN: [id number]  Date: 10/31/2014  DOB: 13-Jul-1960  Weekly Radiation Therapy Management    ICD-9-CM ICD-10-CM   1. Recurrent cervical cancer (HCC) 180.9 C53.9      Current Dose: 28.8 Gy     Planned Dose:  45+ Gy  Narrative . . . . . . . . The patient presents for routine under treatment assessment.                                   The patient is without complaint. No more epigastric pain. She has some mild loose bowels but no diarrhea or rectal or bladder irritation she is having some mild fatigue                                 Set-up films were reviewed.                                 The chart was checked. Physical Findings. . .  height is 5\' 8"  (1.727 m) and weight is 165 lb 14.4 oz (75.252 kg). Her oral temperature is 98.2 F (36.8 C). Her blood pressure is 156/87 and her pulse is 64. Her respiration is 18. . The lungs are clear. The heart has regular rhythm and rate. The abdomen is soft and nontender with normal bowel sounds. Impression . . . . . . . The patient is tolerating radiation. Plan . . . . . . . . . . . . Continue treatment as planned.  ________________________________   Blair Promise, PhD, MD

## 2014-10-31 NOTE — Telephone Encounter (Signed)
-----   Message from Gordy Levan, MD sent at 10/31/2014  9:25 AM EDT ----- Labs seen and need follow up: please let her know labs still a little low but ok for radiation - platelets are slightly higher than last week. She needs to let MD know if bleeding. Keep apt LL as sched

## 2014-10-31 NOTE — Telephone Encounter (Signed)
S/w pt about below message. Pt expressed understanding.

## 2014-11-01 ENCOUNTER — Ambulatory Visit
Admission: RE | Admit: 2014-11-01 | Discharge: 2014-11-01 | Disposition: A | Discharge status: Home / Self Care | Payer: No Typology Code available for payment source | Source: Ambulatory Visit | Attending: Radiation Oncology | Admitting: Radiation Oncology

## 2014-11-01 ENCOUNTER — Ambulatory Visit (HOSPITAL_COMMUNITY)
Admission: RE | Admit: 2014-11-01 | Discharge: 2014-11-01 | Disposition: A | Discharge status: Home / Self Care | Payer: No Typology Code available for payment source | Source: Ambulatory Visit | Attending: Internal Medicine | Admitting: Internal Medicine

## 2014-11-01 DIAGNOSIS — B182 Chronic viral hepatitis C: Secondary | ICD-10-CM | POA: Diagnosis not present

## 2014-11-01 DIAGNOSIS — Z510 Encounter for antineoplastic radiation therapy: Secondary | ICD-10-CM | POA: Diagnosis not present

## 2014-11-01 DIAGNOSIS — I10 Essential (primary) hypertension: Secondary | ICD-10-CM | POA: Diagnosis not present

## 2014-11-02 ENCOUNTER — Ambulatory Visit
Admission: RE | Admit: 2014-11-02 | Discharge: 2014-11-02 | Disposition: A | Discharge status: Home / Self Care | Payer: No Typology Code available for payment source | Source: Ambulatory Visit | Attending: Radiation Oncology | Admitting: Radiation Oncology

## 2014-11-02 DIAGNOSIS — Z510 Encounter for antineoplastic radiation therapy: Secondary | ICD-10-CM | POA: Diagnosis not present

## 2014-11-03 ENCOUNTER — Ambulatory Visit
Admission: RE | Admit: 2014-11-03 | Discharge: 2014-11-03 | Disposition: A | Discharge status: Home / Self Care | Payer: No Typology Code available for payment source | Source: Ambulatory Visit | Attending: Radiation Oncology | Admitting: Radiation Oncology

## 2014-11-03 DIAGNOSIS — Z510 Encounter for antineoplastic radiation therapy: Secondary | ICD-10-CM | POA: Diagnosis not present

## 2014-11-06 ENCOUNTER — Ambulatory Visit
Admission: RE | Admit: 2014-11-06 | Discharge: 2014-11-06 | Disposition: A | Discharge status: Home / Self Care | Payer: No Typology Code available for payment source | Source: Ambulatory Visit | Attending: Radiation Oncology | Admitting: Radiation Oncology

## 2014-11-06 DIAGNOSIS — Z510 Encounter for antineoplastic radiation therapy: Secondary | ICD-10-CM | POA: Diagnosis not present

## 2014-11-07 ENCOUNTER — Ambulatory Visit
Admission: RE | Admit: 2014-11-07 | Discharge: 2014-11-07 | Disposition: A | Discharge status: Home / Self Care | Payer: No Typology Code available for payment source | Source: Ambulatory Visit | Attending: Radiation Oncology | Admitting: Radiation Oncology

## 2014-11-07 ENCOUNTER — Encounter: Payer: Self-pay | Admitting: Radiation Oncology

## 2014-11-07 ENCOUNTER — Other Ambulatory Visit (HOSPITAL_COMMUNITY)
Admission: RE | Admit: 2014-11-07 | Discharge: 2014-11-07 | Disposition: A | Discharge status: Home / Self Care | Payer: No Typology Code available for payment source | Source: Other Acute Inpatient Hospital | Attending: Radiation Oncology | Admitting: Radiation Oncology

## 2014-11-07 VITALS — BP 142/83 | HR 69 | Temp 98.1°F | Resp 18 | Ht 68.0 in | Wt 162.3 lb

## 2014-11-07 DIAGNOSIS — Z510 Encounter for antineoplastic radiation therapy: Secondary | ICD-10-CM | POA: Diagnosis not present

## 2014-11-07 DIAGNOSIS — R300 Dysuria: Secondary | ICD-10-CM | POA: Insufficient documentation

## 2014-11-07 DIAGNOSIS — C539 Malignant neoplasm of cervix uteri, unspecified: Secondary | ICD-10-CM

## 2014-11-07 LAB — URINALYSIS, ROUTINE W REFLEX MICROSCOPIC
Glucose, UA: NEGATIVE mg/dL
HGB URINE DIPSTICK: NEGATIVE
Ketones, ur: NEGATIVE mg/dL
Nitrite: NEGATIVE
PH: 5.5 (ref 5.0–8.0)
Protein, ur: NEGATIVE mg/dL
SPECIFIC GRAVITY, URINE: 1.022 (ref 1.005–1.030)
UROBILINOGEN UA: 4 mg/dL — AB (ref 0.0–1.0)

## 2014-11-07 LAB — URINE MICROSCOPIC-ADD ON

## 2014-11-07 NOTE — Progress Notes (Signed)
Felipa Laroche has completed 21 fractions to her pelvis.  She reports having cramping pain that started Sunday night in her right pelvis area that she is rating at a 8/10.  She reports having burning with urination that started on Friday.  She also reports feeling pressure in her bladder before and after urinating.  She denies having hematuria.  She has noticed a milky colored vaginal discharge which is new.  She reports having diarrhea on Saturday but now is having about 2 loose stools per day.  She has not started taking Imodium yet.  She reports feeling tired in the evenings.  BP 142/83 mmHg  Pulse 69  Temp(Src) 98.1 F (36.7 C) (Oral)  Resp 18  Ht 5\' 8"  (1.727 m)  Wt 162 lb 4.8 oz (73.619 kg)  BMI 24.68 kg/m2

## 2014-11-07 NOTE — Progress Notes (Signed)
  Radiation Oncology         [phone] ________________________________  Name: Kathy Little MRN: [id number]  Date: 11/07/2014  DOB: May 19, 1960  Weekly Radiation Therapy Management    ICD-9-CM ICD-10-CM   1. Dysuria 788.1 R30.0 Urine culture     Urinalysis, Microscopic - [facility]  2. Recurrent cervical cancer (HCC) 180.9 C53.9      Current Dose: 37.8 Gy     Planned Dose:  45+ Gy  Narrative . . . . . . . . The patient presents for routine under treatment assessment.                                   The patient has developed significant urinary symptoms over the past few days. She has some mild diarrhea but has not required any Imodium. She is having some fatigue.                                 Set-up films were reviewed.                                 The chart was checked. Physical Findings. . .  height is 5\' 8"  (1.727 m) and weight is 162 lb 4.8 oz (73.619 kg). Her oral temperature is 98.1 F (36.7 C). Her blood pressure is 142/83 and her pulse is 69. Her respiration is 18. . Weight essentially stable.  No significant changes. The lungs are clear. The heart has a regular rhythm and rate. The abdomen is soft and nontender with normal bowel sounds. Impression . . . . . . . The patient is tolerating radiation. Plan . . . . . . . . . . . . Continue treatment as planned. Patient provided a clean catch urine specimen today for culture and microscopic evaluation.  ________________________________   Blair Promise, PhD, MD

## 2014-11-08 ENCOUNTER — Ambulatory Visit
Admission: RE | Admit: 2014-11-08 | Discharge: 2014-11-08 | Disposition: A | Discharge status: Home / Self Care | Payer: No Typology Code available for payment source | Source: Ambulatory Visit | Attending: Radiation Oncology | Admitting: Radiation Oncology

## 2014-11-08 DIAGNOSIS — Z510 Encounter for antineoplastic radiation therapy: Secondary | ICD-10-CM | POA: Diagnosis not present

## 2014-11-09 ENCOUNTER — Ambulatory Visit
Admission: RE | Admit: 2014-11-09 | Discharge: 2014-11-09 | Disposition: A | Discharge status: Home / Self Care | Payer: No Typology Code available for payment source | Source: Ambulatory Visit | Attending: Radiation Oncology | Admitting: Radiation Oncology

## 2014-11-09 DIAGNOSIS — Z510 Encounter for antineoplastic radiation therapy: Secondary | ICD-10-CM | POA: Diagnosis not present

## 2014-11-09 LAB — URINE CULTURE

## 2014-11-10 ENCOUNTER — Ambulatory Visit
Admission: RE | Admit: 2014-11-10 | Discharge: 2014-11-10 | Disposition: A | Discharge status: Home / Self Care | Payer: No Typology Code available for payment source | Source: Ambulatory Visit | Attending: Radiation Oncology | Admitting: Radiation Oncology

## 2014-11-10 DIAGNOSIS — Z510 Encounter for antineoplastic radiation therapy: Secondary | ICD-10-CM | POA: Diagnosis not present

## 2014-11-13 ENCOUNTER — Ambulatory Visit
Admission: RE | Admit: 2014-11-13 | Discharge: 2014-11-13 | Disposition: A | Discharge status: Home / Self Care | Payer: No Typology Code available for payment source | Source: Ambulatory Visit | Attending: Radiation Oncology | Admitting: Radiation Oncology

## 2014-11-13 ENCOUNTER — Inpatient Hospital Stay: Admission: RE | Admit: 2014-11-13 | Payer: Self-pay | Source: Ambulatory Visit | Admitting: Radiation Oncology

## 2014-11-13 DIAGNOSIS — R300 Dysuria: Secondary | ICD-10-CM

## 2014-11-13 DIAGNOSIS — Z510 Encounter for antineoplastic radiation therapy: Secondary | ICD-10-CM | POA: Diagnosis not present

## 2014-11-13 NOTE — Progress Notes (Signed)
Patient has completed her last treatment.  She does not want to stay and see the doctor today.  She was advised of her urine culture results.  She reports having vaginal spotting that started yesterday.  Dr. Sondra Come will be advised.  She has been given a one month follow up card.

## 2014-11-16 ENCOUNTER — Telehealth: Payer: Self-pay | Admitting: *Deleted

## 2014-11-16 NOTE — Telephone Encounter (Signed)
CALLED PATIENT TO INFORM OF HDR VAG. CUFF CASE , SPOKE WITH PATIENT AND SHE IS AWARE OF THESE APPTS. 

## 2014-11-19 ENCOUNTER — Other Ambulatory Visit: Payer: Self-pay | Admitting: Oncology

## 2014-11-20 ENCOUNTER — Telehealth: Payer: Self-pay | Admitting: Oncology

## 2014-11-20 ENCOUNTER — Other Ambulatory Visit: Payer: No Typology Code available for payment source

## 2014-11-20 ENCOUNTER — Ambulatory Visit: Payer: No Typology Code available for payment source | Admitting: Oncology

## 2014-11-20 NOTE — Telephone Encounter (Signed)
Returned patients call to reschedule her appointment as she is not feeling well  Kathy Little

## 2014-11-21 ENCOUNTER — Telehealth: Payer: Self-pay | Admitting: *Deleted

## 2014-11-21 NOTE — Telephone Encounter (Signed)
CALLED PATIENT TO REMIND OF HDR CASE FOR 11-22-14, LVM FOR A RETURN CALL

## 2014-11-22 ENCOUNTER — Ambulatory Visit
Admission: RE | Admit: 2014-11-22 | Discharge: 2014-11-22 | Disposition: A | Discharge status: Home / Self Care | Payer: No Typology Code available for payment source | Source: Ambulatory Visit | Attending: Radiation Oncology | Admitting: Radiation Oncology

## 2014-11-22 ENCOUNTER — Telehealth: Payer: Self-pay | Admitting: Oncology

## 2014-11-22 ENCOUNTER — Encounter: Payer: Self-pay | Admitting: Radiation Oncology

## 2014-11-22 VITALS — BP 139/90 | HR 77 | Temp 98.6°F | Resp 18 | Ht 68.0 in | Wt 162.3 lb

## 2014-11-22 DIAGNOSIS — Z510 Encounter for antineoplastic radiation therapy: Secondary | ICD-10-CM | POA: Diagnosis not present

## 2014-11-22 DIAGNOSIS — C539 Malignant neoplasm of cervix uteri, unspecified: Secondary | ICD-10-CM

## 2014-11-22 NOTE — Progress Notes (Signed)
Please see the Nurse Progress Note in the MD Initial Consult Encounter for this patient. 

## 2014-11-22 NOTE — Progress Notes (Signed)
  Radiation Oncology         [phone]) [phone] ________________________________  Name: Kathy Little MRN: [id number]  Date: 11/22/2014  DOB: [birth year]  Vaginal Brachytherapy Procedure Note   CC: No PCP Per Patient  Dorothyann Gibbs, NP   ICD-9-CM ICD-10-CM   1. Recurrent cervical cancer (HCC) 180.9 C53.9       Diagnosis: Recurrent cervical cancer    Narrative:  Kathy Little here for HDR planning and treatment. She reports having pain in her right pelvis last week. She reports the pain stopped on Monday. She reports having occasional dysuria and pressure when she urinates. She reports having vaginal bleeding that comes and goes. She also reports having clear vaginal discharge that started last Friday. She reports having nausea and is taking Ativan. She has 2 tablets left and may need a refill. She reports having fatigue. She has a red area on her right hip that she says it itching.                               ALLERGIES:  is allergic to flagyl.  Meds: Current Outpatient Prescriptions  Medication Sig Dispense Refill  . LORazepam (ATIVAN) 1 MG tablet Place 1/2 to 1 tablet under the tongue or swallow every 6 hours as needed for nausea. Will make drowsy 20 tablet 0  . losartan (COZAAR) 50 MG tablet TK 1 T PO ONCE D  6  . traMADol (ULTRAM) 50 MG tablet TK 1 T PO Q 12 H PRN  0  . diphenhydrAMINE (BENADRYL ALLERGY) 25 mg capsule Take 25 mg by mouth every 6 (six) hours as needed.    . loratadine (CLARITIN) 10 MG tablet Take 1 tablet (10 mg total) by mouth daily. (Patient not taking: Reported on 10/31/2014) 30 tablet 5   No current facility-administered medications for this encounter.    Physical Findings: The patient is in no acute distress. Patient is alert and oriented.  height is 5\' 8"  (1.727 m) and weight is 162 lb 4.8 oz (73.619 kg). Her oral temperature is 98.6 F (37 C). Her blood pressure is 139/90 and her pulse is 77. Her respiration is 18 and oxygen saturation is 100%.  .  No significant changes. General: Well-developed, in no acute distress HEENT: Normocephalic, atraumatic Cardiovascular: Regular rate and rhythm Respiratory: Clear to auscultation bilaterally Pelvic Exam: Vaginal cuff intact, no mucosal lesion noted in the vaginal vault. No palpable masses. Patient has rash on right pelvis mildly suspicous shingles but will continue to follow.  Procedure note:  Patient proceeded to undergo fitting for her custom vaginal cylinder. The optimal diameter to distend the vaginal vault without undue discomfort was a 3.0 diameter segmented cylinder. She tolerated the fitting well.   Simple treatment device note:  Patient had construction of her custom vaginal cylinder. She will be treated with a 3.0 diameter segmented cylinder.   Impression:  Successful fitting for vaginal brachytherapy treatment  Plan: Patient will proceed to the CT simulator suite for planning and eventual treatment later today.   -----------------------------------  Blair Promise, PhD, MD  This document serves as a record of services personally performed by Gery Pray, MD. It was created on his behalf by Derek Mound, a trained medical scribe. The creation of this record is based on the scribe's personal observations and the provider's statements to them. This document has been checked and approved by the attending provider.

## 2014-11-22 NOTE — Progress Notes (Signed)
Kathy Little here for follow up/HDR.  She reports having pain in her right pelvis last week.  She reports the pain stopped on Monday.  She reports having occasional dysuria and pressure when she urinates.  She reports having vaginal bleeding that comes and goes.  She also reports having clear vaginal discharge that started last Friday.  She reports having nausea and is taking Ativan.  She has 2 tablets left and may need a refill.  She reports having fatigue.  She has a red area on her right hip that she says it itching.    BP 139/90 mmHg  Pulse 77  Temp(Src) 98.6 F (37 C) (Oral)  Resp 18  Ht 5\' 8"  (1.727 m)  Wt 162 lb 4.8 oz (73.619 kg)  BMI 24.68 kg/m2  SpO2 100%

## 2014-11-22 NOTE — Progress Notes (Signed)
  Radiation Oncology         [phone] ________________________________  Name: Kathy Little MRN: [id number]  Date: 11/22/2014  DOB: [birth year]  SIMULATION AND TREATMENT PLANNING NOTE HDR BRACHYTHERAPY  DIAGNOSIS:  Recurrent Cervical Cancer  NARRATIVE:  The patient was brought to the [place] suite.  Identity was confirmed.  All relevant records and images related to the planned course of therapy were reviewed.  The patient freely provided informed written consent to proceed with treatment after reviewing the details related to the planned course of therapy. The consent form was witnessed and verified by the simulation staff.  Then, the patient was set-up in a stable reproducible  supine position for radiation therapy.  CT images were obtained.  Surface markings were placed.  The CT images were loaded into the planning software.  Then the target and avoidance structures were contoured.  Treatment planning then occurred.  The radiation prescription was entered and confirmed.   I have requested : Brachytherapy Isodose Plan and Dosimetry Calculations to plan the radiation distribution.    PLAN:  The patient will receive 24 Gy in 4 fractions.  The patient will be treated with a 3.0 segmented cylinder. Treatment length will be 4 cm. Iridium 192 will be the high-dose-rate source.    ________________________________  Blair Promise, PhD, MD   This document serves as a record of services personally performed by Gery Pray, MD. It was created on his behalf by Derek Mound, a trained medical scribe. The creation of this record is based on the scribe's personal observations and the provider's statements to them. This document has been checked and approved by the attending provider.

## 2014-11-22 NOTE — Telephone Encounter (Signed)
Called Kathy Little regarding her treatment today.  She said she had to leave because she started to feel sick and short of breath after smelling cigarette smoke in the waiting room.  Transferred her to [facility] to reschedule.

## 2014-11-23 ENCOUNTER — Encounter: Payer: Self-pay | Admitting: Radiation Oncology

## 2014-11-23 ENCOUNTER — Ambulatory Visit
Admission: RE | Admit: 2014-11-23 | Discharge: 2014-11-23 | Disposition: A | Discharge status: Home / Self Care | Payer: No Typology Code available for payment source | Source: Ambulatory Visit | Attending: Radiation Oncology | Admitting: Radiation Oncology

## 2014-11-23 ENCOUNTER — Ambulatory Visit: Admission: RE | Admit: 2014-11-23 | Payer: No Typology Code available for payment source | Source: Ambulatory Visit

## 2014-11-23 NOTE — Progress Notes (Signed)
  Radiation Oncology         [phone]) [phone] ________________________________  Name: Calandria Yaklin MRN: [id number]  Date: 11/23/2014  DOB: Jun 20, 1960   Radiation Therapy Management  DIAGNOSIS: Recurrent Cervical Cancer  Narrative . . . . . . . . Earlier today the patient was scheduled to receive her first high-dose-rate treatment. The patient's custom vaginal cylinder was placed by myself without difficulty. Patient then proceeded to undergo imaging to verify accurate position of the cylinder for treatment. Unfortunately the imaging device was unable to take images and the device needed to be restarted. The patient became anxious and decided to leave without proceeding with her treatments.  She slid out of the treatment position without allowing [place] to remove her vaginal cylinder. Patient did move away from the cylinder so this did not cause her significant discomfort.     This evening I spoke with the patient. She is feeling much better and is not happy with her behavior earlier today. She denies any soreness in the vulvovaginal area or any signs of bleeding. She denies any difficulties with urination.   She does wish to proceed with her  high-dose-rate treatment which has been scheduled for November 22 at 10 AM. She does have some Ativan and will take this prior to the procedure. She is not currently driving but her significant other or other friend will bring her in for treatment.                                    ________________________________   Blair Promise, PhD, MD

## 2014-11-26 ENCOUNTER — Encounter: Payer: Self-pay | Admitting: Radiation Oncology

## 2014-11-26 NOTE — Progress Notes (Signed)
  Radiation Oncology         [phone] ________________________________  Name: Kathy Little MRN: [id number]  Date: 11/26/2014  DOB: 1960-01-12  End of Treatment Note  Diagnosis:   Recurrent cervical cancer  Indication for treatment:  Definitive/curative treatment       Radiation treatment dates:   10/10/2014 through 11/13/2014  Site/dose:   Pelvis 45 gray in 25 fractions  Beams/energy:   Helical intensity modulated radiation therapy, 6 MV photons  Narrative: The patient tolerated radiation treatment relatively well.   She did experience some mild diarrhea and urinary symptoms as well as fatigue during the course for treatment  Plan: The patient has completed her initial radiation treatment.  She will proceed with a brachytherapy boost directed at the proximal vaginal vault.  -----------------------------------  Blair Promise, PhD, MD

## 2014-11-27 ENCOUNTER — Telehealth: Payer: Self-pay | Admitting: *Deleted

## 2014-11-27 ENCOUNTER — Encounter: Payer: Self-pay | Admitting: Gynecologic Oncology

## 2014-11-27 NOTE — Telephone Encounter (Signed)
Called patient to remind of HDR Tx. For 11-28-14 @ 1 pm, lvm for a return call

## 2014-11-28 ENCOUNTER — Ambulatory Visit
Admission: RE | Admit: 2014-11-28 | Discharge: 2014-11-28 | Disposition: A | Discharge status: Home / Self Care | Payer: No Typology Code available for payment source | Source: Ambulatory Visit | Attending: Radiation Oncology | Admitting: Radiation Oncology

## 2014-11-28 ENCOUNTER — Encounter: Payer: Self-pay | Admitting: Oncology

## 2014-11-28 DIAGNOSIS — Z510 Encounter for antineoplastic radiation therapy: Secondary | ICD-10-CM | POA: Diagnosis not present

## 2014-11-28 DIAGNOSIS — C539 Malignant neoplasm of cervix uteri, unspecified: Secondary | ICD-10-CM

## 2014-11-28 NOTE — Progress Notes (Signed)
  Radiation Oncology         [phone] ________________________________  Name: Kathy Little MRN: [id number]  Date: 11/28/2014  DOB: 1960/12/23   HDR BRACHYTHERAPY  DIAGNOSIS:  Recurrent endometrial cancer  Simple treatment device note:  Patient had construction of her custom vaginal cylinder. She will be treated with a 3 cm segmented cylinder.   NARRATIVE:  The patient was brought to the [place].  Identity was confirmed.  All relevant records and images related to the planned course of therapy were reviewed.  The patient freely provided informed written consent to proceed with treatment after reviewing the details related to the planned course of therapy. The consent form was witnessed and verified by the simulation staff.  Then, the patient was set-up in a stable reproducible  supine position for radiation therapy.    Vaginal brachytherapy procedure:  Patient proceeded to undergo undergo pelvic examination. The vaginal cuff was noted to be intact. There are no masses palpable within the lower pelvis region. Patient proceeded to undergo serial dilation of the vaginal introitus. She then had placement of her custom vaginal cylinder within the proximal vagina. This was affixed to the CT/MR stabilization plate to prevent slippage. Patient tolerated the procedure well.  Verification simulation note:  An AP image was obtained through the pelvis area. This verified accurate position of the vaginal cylinder for treatment. Unfortunately the imaging equipment was unable to save the image but I did obtain good view of the vaginal cylinder with fiducial markers in place to verify accurate position of the vaginal cylinder for treatment.  High-dose-rate brachytherapy treatment:  The remote afterloading device was affixed to the vaginal cylinder by catheter system. The patient then proceeded to undergo her first high-dose-rate treatment directed at the upper vaginal vault. The  patient was prescribed a dose of 6 gray to be delivered to the mucosal surface. This was achieved with 1 channel using 9 dwell positions. Total treatment time was 310.0 seconds. The patient tolerated the treatment well.  After completion of her therapy a radiation survey was performed documenting return of the iridium 192 source into the gamma med safe.   PLAN:  Patient will return next week for her second high-dose-rate treatment.     ________________________________  Blair Promise, PhD, MD

## 2014-12-04 ENCOUNTER — Telehealth: Payer: Self-pay | Admitting: *Deleted

## 2014-12-04 NOTE — Telephone Encounter (Signed)
CALLED PATIENT TO REMIND OF HDR TX. FOR 12-05-14 @ 1 PM, SPOKE WITH PATIENT AND SHE IS AWARE OF THIS [place].

## 2014-12-05 ENCOUNTER — Ambulatory Visit
Admission: RE | Admit: 2014-12-05 | Discharge: 2014-12-05 | Disposition: A | Discharge status: Home / Self Care | Payer: No Typology Code available for payment source | Source: Ambulatory Visit | Attending: Radiation Oncology | Admitting: Radiation Oncology

## 2014-12-05 DIAGNOSIS — Z510 Encounter for antineoplastic radiation therapy: Secondary | ICD-10-CM | POA: Diagnosis not present

## 2014-12-05 DIAGNOSIS — C539 Malignant neoplasm of cervix uteri, unspecified: Secondary | ICD-10-CM

## 2014-12-05 NOTE — Progress Notes (Addendum)
Patient Information    Patient Name Sex DOB SSN   Kathy Little, Kathy Little Female 07-28-1960 [id number]    Progress Notes by Gery Pray, MD at 11/28/2014 1:54 PM    Author: Gery Pray, MD Service: Surgery Author Type: Physician   Filed: 12/05/2014 2:02 PM Note Time: 12/05/2014 1:54 PM Status: Signed   Editor: Gery Pray, MD (Physician)     Expand All Collapse All    Radiation Oncology [phone] ________________________________  Name: Kathy MorinMRN: [id number] Date: 11/29/2016DOB: June 07, 1960   HDR BRACHYTHERAPY  DIAGNOSIS: Recurrent endometrial cancer  Simple treatment device note:  Patient had construction of her custom vaginal cylinder. She will be treated with a 3 cm segmented cylinder.   NARRATIVE: The patient was brought to the [place]. Identity was confirmed. All relevant records and images related to the planned course of therapy were reviewed. The patient freely provided informed written consent to proceed with treatment after reviewing the details related to the planned course of therapy. The consent form was witnessed and verified by the simulation staff. Then, the patient was set-up in a stable reproducible supine position for radiation therapy.   Vaginal brachytherapy procedure:  Patient proceeded to undergo undergo pelvic examination. The vaginal cuff was noted to be intact. There are no masses palpable within the lower pelvis region. Patient proceeded to undergo serial dilation of the vaginal introitus. She then had placement of her custom vaginal cylinder within the proximal vagina. This was affixed to the CT/MR stabilization plate to prevent slippage. Patient tolerated the procedure well.  Verification simulation note:  An AP image was obtained through the pelvis area. This verified accurate position of the vaginal cylinder for treatment. A lateral image could not be obtained  due to equipment issues.  High-dose-rate brachytherapy treatment:  The remote afterloading device was affixed to the vaginal cylinder by catheter system. The patient then proceeded to undergo her second high-dose-rate treatment directed at the upper vaginal vault. The patient was prescribed a dose of 6 gray to be delivered to the mucosal surface. This was achieved with 1 channel using 9 dwell positions. Total treatment time was 331.10 seconds. The patient tolerated the treatment well. After completion of her therapy a radiation survey was performed documenting return of the iridium 192 source into the gamma med safe.   PLAN: Patient will return next week for her third high-dose-rate treatment.     ________________________________  Blair Promise, PhD, MD

## 2014-12-11 ENCOUNTER — Telehealth: Payer: Self-pay | Admitting: *Deleted

## 2014-12-11 ENCOUNTER — Ambulatory Visit
Admission: RE | Admit: 2014-12-11 | Discharge: 2014-12-11 | Disposition: A | Discharge status: Home / Self Care | Payer: No Typology Code available for payment source | Source: Ambulatory Visit | Attending: Radiation Oncology | Admitting: Radiation Oncology

## 2014-12-11 DIAGNOSIS — Z800 Family history of malignant neoplasm of digestive organs: Secondary | ICD-10-CM | POA: Insufficient documentation

## 2014-12-11 DIAGNOSIS — Z510 Encounter for antineoplastic radiation therapy: Secondary | ICD-10-CM | POA: Insufficient documentation

## 2014-12-11 DIAGNOSIS — C539 Malignant neoplasm of cervix uteri, unspecified: Secondary | ICD-10-CM | POA: Insufficient documentation

## 2014-12-11 DIAGNOSIS — C52 Malignant neoplasm of vagina: Secondary | ICD-10-CM | POA: Insufficient documentation

## 2014-12-11 DIAGNOSIS — F17200 Nicotine dependence, unspecified, uncomplicated: Secondary | ICD-10-CM | POA: Insufficient documentation

## 2014-12-11 NOTE — Telephone Encounter (Signed)
Called patient to remind of HDR Tx. For 12-12-14 @ 1 pm, spoke with patient and she is aware of this tx.

## 2014-12-12 ENCOUNTER — Ambulatory Visit
Admission: RE | Admit: 2014-12-12 | Discharge: 2014-12-12 | Disposition: A | Discharge status: Home / Self Care | Payer: No Typology Code available for payment source | Source: Ambulatory Visit | Attending: Radiation Oncology | Admitting: Radiation Oncology

## 2014-12-12 DIAGNOSIS — F17200 Nicotine dependence, unspecified, uncomplicated: Secondary | ICD-10-CM | POA: Insufficient documentation

## 2014-12-12 DIAGNOSIS — Z510 Encounter for antineoplastic radiation therapy: Secondary | ICD-10-CM | POA: Insufficient documentation

## 2014-12-12 DIAGNOSIS — C539 Malignant neoplasm of cervix uteri, unspecified: Secondary | ICD-10-CM | POA: Insufficient documentation

## 2014-12-12 DIAGNOSIS — C52 Malignant neoplasm of vagina: Secondary | ICD-10-CM | POA: Insufficient documentation

## 2014-12-12 DIAGNOSIS — Z800 Family history of malignant neoplasm of digestive organs: Secondary | ICD-10-CM | POA: Insufficient documentation

## 2014-12-12 NOTE — Progress Notes (Signed)
Note Time: 12/05/2014 1:54 PM Status: Signed    Editor: Gery Pray, MD (Physician)     Expand All Collapse All   Radiation Oncology [phone] ________________________________  Name: Kathy MorinMRN: [id number] Date:12/06/16DOB: [birth year]   HDR BRACHYTHERAPY  DIAGNOSIS: Recurrent endometrial cancer  Simple treatment device note:  Patient had construction of her custom vaginal cylinder. She will be treated with a 3 cm segmented cylinder.   NARRATIVE: The patient was brought to the [place]. Identity was confirmed. All relevant records and images related to the planned course of therapy were reviewed. The patient freely provided informed written consent to proceed with treatment after reviewing the details related to the planned course of therapy. The consent form was witnessed and verified by the simulation staff. Then, the patient was set-up in a stable reproducible supine position for radiation therapy.   Vaginal brachytherapy procedure:  Patient proceeded to undergo undergo pelvic examination. The vaginal cuff was noted to be intact. There are no masses palpable within the lower pelvis region. Patient proceeded to undergo serial dilation of the vaginal introitus. She then had placement of her custom vaginal cylinder within the proximal vagina. This was affixed to the CT/MR stabilization plate to prevent slippage. Patient tolerated the procedure well.  Verification simulation note:  An AP image was obtained through the pelvis area. This verified accurate position of the vaginal cylinder for treatment. A lateral image could not be obtained due to equipment issues.  High-dose-rate brachytherapy treatment:  The remote afterloading device was affixed to the vaginal cylinder by catheter system. The patient then proceeded to undergo her third high-dose-rate treatment directed at the upper vaginal  vault. The patient was prescribed a dose of 6 gray to be delivered to the mucosal surface. This was achieved with 1 channel using 9 dwell positions. Total treatment time was 353.5 seconds. The patient tolerated the treatment well. After completion of her therapy a radiation survey was performed documenting return of the iridium 192 source into the gamma med safe.   PLAN: Patient will return next week for her fourth high-dose-rate treatment.     ________________________________  Blair Promise, PhD, MD

## 2014-12-14 ENCOUNTER — Ambulatory Visit: Payer: No Typology Code available for payment source | Admitting: Radiation Oncology

## 2014-12-18 ENCOUNTER — Telehealth: Payer: Self-pay | Admitting: *Deleted

## 2014-12-18 NOTE — Telephone Encounter (Signed)
CALLED PATIENT TO REMIND OF HDR TX. FOR 12-19-14 @ 1 PM, SPOKE WITH PATIENT AND SHE IS AWARE OF THIS Kathy Little.

## 2014-12-19 ENCOUNTER — Ambulatory Visit
Admission: RE | Admit: 2014-12-19 | Discharge: 2014-12-19 | Disposition: A | Discharge status: Home / Self Care | Payer: No Typology Code available for payment source | Source: Ambulatory Visit | Attending: Radiation Oncology | Admitting: Radiation Oncology

## 2014-12-19 DIAGNOSIS — C539 Malignant neoplasm of cervix uteri, unspecified: Secondary | ICD-10-CM

## 2014-12-19 NOTE — Progress Notes (Signed)
  Radiation Oncology [phone] ________________________________  Name: Kathy MorinMRN: [id number] Date:12/13/16DOB: [birth year]   HDR BRACHYTHERAPY  DIAGNOSIS: Recurrent endometrial cancer  Simple treatment device note:  Patient had construction of her custom vaginal cylinder. She will be treated with a 3 cm segmented cylinder.   NARRATIVE: The patient was brought to the [place]. Identity was confirmed. All relevant records and images related to the planned course of therapy were reviewed. The patient freely provided informed written consent to proceed with treatment after reviewing the details related to the planned course of therapy. The consent form was witnessed and verified by the simulation staff. Then, the patient was set-up in a stable reproducible supine position for radiation therapy.   Vaginal brachytherapy procedure:  Patient proceeded to undergo undergo pelvic examination. The vaginal cuff was noted to be intact. There are no masses palpable within the lower pelvis region. Patient proceeded to undergo serial dilation of the vaginal introitus. She then had placement of her custom vaginal cylinder within the proximal vagina. This was affixed to the CT/MR stabilization plate to prevent slippage. Patient tolerated the procedure well.  Verification simulation note:  An AP image was obtained through the pelvis area. This verified accurate position of the vaginal cylinder for treatment. However due to equipment issues a copy of thisimages could not be performed  High-dose-rate brachytherapy treatment:  The remote afterloading device was affixed to the vaginal cylinder by catheter system. The patient then proceeded to undergo her fourth high-dose-rate treatment directed at the upper vaginal vault. The patient was prescribed a dose of 6 gray to be delivered to the mucosal surface. This was achieved with 1  channel using 9 dwell positions. Total treatment time was 377.6 seconds. The patient tolerated the treatment well. After completion of her therapy a radiation survey was performed documenting return of the iridium 192 source into the gamma med safe. This completes the patient's planning course of external beam and vaginal brachytherapy   PLAN:Routine follow-up in one month .     ________________________________  Blair Promise, PhD, MD

## 2014-12-20 ENCOUNTER — Other Ambulatory Visit: Payer: Self-pay | Admitting: Oncology

## 2014-12-21 ENCOUNTER — Other Ambulatory Visit: Payer: No Typology Code available for payment source

## 2014-12-21 ENCOUNTER — Ambulatory Visit: Payer: No Typology Code available for payment source | Admitting: Oncology

## 2015-01-09 ENCOUNTER — Ambulatory Visit: Payer: No Typology Code available for payment source | Admitting: Internal Medicine

## 2015-01-15 ENCOUNTER — Encounter: Payer: Self-pay | Admitting: Radiation Oncology

## 2015-01-15 NOTE — Progress Notes (Signed)
  Radiation Oncology         [phone] ________________________________  Name: Kathy Little MRN: [id number]  Date: 01/15/2015  DOB: [birth year]  End of Treatment Note  Diagnosis:   Recurrent cervical cancer     Indication for treatment:  Definitive treatment       Radiation treatment dates:   External beam; 10/10/2014 through 11/13/2014                                                   Brachytherapy boost:  November 22, November 29, December 6, December 13  Site/dose:   Pelvis 45 gray in 25 fractions, proximal vagina 24 gray in 4 fractions with brachytherapy boost,   Beams/energy:   External beam: Helical intensity modulated radiation therapy, 6 MV photons; vaginal vault brachytherapy boost iridium 192 as the high-dose-rate source, a 3 cm segmented cylinder was used to deliver the treatment with a 4 cm treatment length  Narrative: The patient tolerated radiation treatment relatively well.   She did experience some mild diarrhea and urinary symptoms as well as fatigue during the course of her external beam treatment. She tolerated her brachytherapy treatments well with minimal discomfort in the vaginal vault  Plan: The patient has completed radiation treatment. The patient will return to radiation oncology clinic for routine followup in one month. I advised them to call or return sooner if they have any questions or concerns related to their recovery or treatment.  -----------------------------------  Blair Promise, PhD, MD

## 2015-01-31 ENCOUNTER — Encounter: Payer: Self-pay | Admitting: Oncology

## 2015-02-01 ENCOUNTER — Ambulatory Visit: Payer: No Typology Code available for payment source | Admitting: Radiation Oncology

## 2015-02-08 ENCOUNTER — Ambulatory Visit
Admission: RE | Admit: 2015-02-08 | Discharge: 2015-02-08 | Disposition: A | Discharge status: Home / Self Care | Payer: No Typology Code available for payment source | Source: Ambulatory Visit | Attending: Radiation Oncology | Admitting: Radiation Oncology

## 2015-02-08 ENCOUNTER — Telehealth: Payer: Self-pay | Admitting: Oncology

## 2015-02-08 NOTE — Telephone Encounter (Signed)
Called [place] regarding her follow up appointment today.  She said that she forgot about her appointment and would like to reschedule.  Notified Earleen Newport, Medical Secretary to call the patient to reschedule.

## 2015-02-12 ENCOUNTER — Ambulatory Visit: Payer: BLUE CROSS/BLUE SHIELD | Admitting: Radiation Oncology

## 2015-02-21 ENCOUNTER — Encounter: Payer: Self-pay | Admitting: Radiation Oncology

## 2015-02-21 ENCOUNTER — Ambulatory Visit
Admission: RE | Admit: 2015-02-21 | Discharge: 2015-02-21 | Disposition: A | Discharge status: Home / Self Care | Payer: BLUE CROSS/BLUE SHIELD | Source: Ambulatory Visit | Attending: Radiation Oncology | Admitting: Radiation Oncology

## 2015-02-21 VITALS — BP 173/93 | HR 75 | Temp 98.3°F | Resp 16 | Ht 68.0 in | Wt 162.3 lb

## 2015-02-21 DIAGNOSIS — C539 Malignant neoplasm of cervix uteri, unspecified: Secondary | ICD-10-CM

## 2015-02-21 NOTE — Progress Notes (Signed)
Kathy Little here for follow up. She reports having pain in her lower abdomen which she has had for the last month.  She is wondering if it was from a stomach virus.  She denies having any bladder issues.  She reports having one soft bowel movement per day.  She denies having any vaginal/rectal bleeding.  She does reports having a brownish/yellow vaginal discharge.  She denies having any nausea.  She reports her energy level is improving.  Her bp was elevated today at 173/93.  She is taking losartan.  BP 173/93 mmHg  Pulse 75  Temp(Src) 98.3 F (36.8 C) (Oral)  Resp 16  Ht 5\' 8"  (1.727 m)  Wt 162 lb 4.8 oz (73.619 kg)  BMI 24.68 kg/m2  SpO2 100%

## 2015-02-21 NOTE — Progress Notes (Signed)
Patient was given a size M vaginal dilator and surgilube.  Patient was educated in teach back mode to apply surgilube to the dilator and to insert for 10 minutes 3 times a week (Monday, Wednesday, Friday) and to wash with soap and water after use.  Patient verbalized understanding and agreement.

## 2015-02-21 NOTE — Progress Notes (Signed)
  Radiation Oncology         [phone] ________________________________  Name: Kathy Little MRN: [id number]  Date: 02/21/2015  DOB: 1960/12/01  Follow-Up Visit Note  CC: No PCP Per Patient  Joylene John D, NP   Diagnosis:   Recurrent cervical cancer  Interval Since Last Radiation:  2 months  Completed external radiation 10/10/2014-11/13/2014 to the pelvis with 45 Gy in 25 fractions plus brachytherapy boost completed 11/28/2014, 12/05/2014, 12/12/2014, and 12/19/2014 to the proximal vagina.  Narrative:  The patient returns today for routine follow-up. She reports having pain in her mid abdomen which she had for the last month. She is wondering if it was from a stomach virus. She denies having any bladder issues. She reports having one soft bowel movement everyday. She denies having any vaginal/rectal bleeding. She does report mild yellow vaginal discharge. She denies nausea. She reports her energy level is improving. Her bp was elevated today at 173/93. She is taking losartan. I have recommended the patient follow-up with her primary care physician concerning her elevated blood pressure.   ALLERGIES:  is allergic to flagyl.  Meds: Current Outpatient Prescriptions  Medication Sig Dispense Refill  . diphenhydrAMINE (BENADRYL ALLERGY) 25 mg capsule Take 25 mg by mouth every 6 (six) hours as needed.    . loratadine (CLARITIN) 10 MG tablet Take 1 tablet (10 mg total) by mouth daily. (Patient not taking: Reported on 10/31/2014) 30 tablet 5  . LORazepam (ATIVAN) 1 MG tablet Place 1/2 to 1 tablet under the tongue or swallow every 6 hours as needed for nausea. Will make drowsy 20 tablet 0  . losartan (COZAAR) 50 MG tablet TK 1 T PO ONCE D  6  . traMADol (ULTRAM) 50 MG tablet TK 1 T PO Q 12 H PRN  0   No current facility-administered medications for this encounter.    Physical Findings: The patient is in no acute distress. Patient is alert and oriented.  vitals were not taken for this  visit..  No significant changes. Lungs are clear to auscultation bilaterally. Heart has regular rate and rhythm. No palpable cervical, supraclavicular, or axillary adenopathy. Abdomen soft, non-tender. No rebound or guarding.  Lab Findings: Lab Results  Component Value Date   WBC 2.9* 10/30/2014   HGB 15.2 10/30/2014   HCT 44.1 10/30/2014   MCV 94.1 10/30/2014   PLT 79* 10/30/2014    Radiographic Findings: No results found.  Impression:  The patient is recovering from the effects of radiation. She is still experiencing some mild fatigue. She feels her abdominal pain is viral related and her husband had the same issue.  Plan: The patient was given a vaginal dilator and instructions of its use. She will follow up with Dr. Denman George next month and then follow up with radiation oncology in 4 months.  ____________________________________  Blair Promise, PhD, MD  This document serves as a record of services personally performed by Gery Pray, MD. It was created on his behalf by Darcus Austin, a trained medical scribe. The creation of this record is based on the scribe's personal observations and the provider's statements to them. This document has been checked and approved by the attending provider.

## 2015-04-09 ENCOUNTER — Encounter: Payer: Self-pay | Admitting: Gynecologic Oncology

## 2015-04-09 ENCOUNTER — Ambulatory Visit: Payer: BLUE CROSS/BLUE SHIELD | Attending: Gynecologic Oncology | Admitting: Gynecologic Oncology

## 2015-04-09 VITALS — BP 148/92 | HR 66 | Temp 97.8°F | Resp 20 | Ht 68.0 in | Wt 159.6 lb

## 2015-04-09 DIAGNOSIS — Z87891 Personal history of nicotine dependence: Secondary | ICD-10-CM | POA: Diagnosis not present

## 2015-04-09 DIAGNOSIS — Z8542 Personal history of malignant neoplasm of other parts of uterus: Secondary | ICD-10-CM | POA: Diagnosis not present

## 2015-04-09 DIAGNOSIS — Z90710 Acquired absence of both cervix and uterus: Secondary | ICD-10-CM | POA: Diagnosis not present

## 2015-04-09 DIAGNOSIS — D696 Thrombocytopenia, unspecified: Secondary | ICD-10-CM | POA: Diagnosis not present

## 2015-04-09 DIAGNOSIS — A630 Anogenital (venereal) warts: Secondary | ICD-10-CM | POA: Diagnosis not present

## 2015-04-09 DIAGNOSIS — C539 Malignant neoplasm of cervix uteri, unspecified: Secondary | ICD-10-CM | POA: Insufficient documentation

## 2015-04-09 DIAGNOSIS — Z9079 Acquired absence of other genital organ(s): Secondary | ICD-10-CM | POA: Insufficient documentation

## 2015-04-09 NOTE — Patient Instructions (Signed)
Follow up with Dr Everitt Amber in September , call us late June to scheduled your follow up appointment . Call with any changes , questions or concerns.  Thank you !

## 2015-04-09 NOTE — Progress Notes (Signed)
Consult Note: Gyn-Onc  Consult was requested by NP Earnstine Regal for the evaluation of Kathy Little 55 y.o. 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  CC:  Chief Complaint  Patient presents with  . recurrent cervical cancer    scheduled follow-up    Assessment/Plan:  Kathy Little  is a 55 y.o.  year old with a history of recurrent cervical carcinoma, s/p primary radiation (chemotherapy not possible due to thrombocytopenia). Complete response. No evidence of disease on examination.   She has some changes consistent with radiation changes. Encouraged her to remain sexually active or use dilator. COunseled regarding symptoms of recurrence and to notify us of these if they develop prior to scheduled visits.  Plan for follow-up is June with Dr Sondra Come and September with me.  HPI: Kathy Little is a 55 year old woman who is seen in consultation at the request of NP Earnstine Regal for squamous cell carcinoma cells seen on pap smear from 08/09/14.  The patient has a remote history of a laparoscopic total hysterectomy, BSO in [place] by Dr Loni Muse in 2012 for cervical dysplasia and abnormal uterine bleeding. Postoperatively she was seen once for "office laser", but did not have followup thereafter.  She began noticing brown vaginal discharge 3 weeks ago. This prompted her to be see by Earnstine Regal who performed an examination and pap smear (8.3.16). Results of the pap smear showed high risk HPV present and squamous cells showing squamous cell carcinoma.  Biopsy of the vagina on 08/30/15 confirmed poorly differentiated carcinoma with basaloid features.  Pretreatment PET showed no metastatic foci.  She was recommended to undergo treatment with primary chemoradiation for presumed recurrent cervical cancer.  She could not receive radiosensitizing cisplatin due to thrombocytopenia. She completed external beam pelvic RT between 10/10/14 - 11/13/14 (45 Gy in 25 fractions) and vaginal  brachytherapy boost in 4 fractions completed 12/19/14.  Interval Hx: She tolerated therapy well with no major toxicities.  Current Meds:  Outpatient Encounter Prescriptions as of 04/09/2015  Medication Sig  . losartan (COZAAR) 50 MG tablet TK 1 T PO ONCE D  . pantoprazole (PROTONIX) 40 MG tablet TK 1 T PO D  . traMADol (ULTRAM) 50 MG tablet TK 1 T PO Q 12 H PRN  . [DISCONTINUED] losartan (COZAAR) 50 MG tablet TK 1 T PO ONCE D  . [DISCONTINUED] pantoprazole (PROTONIX) 40 MG tablet Take 40 mg by mouth daily.  . [DISCONTINUED] traMADol (ULTRAM) 50 MG tablet Reported on 02/21/2015  . [DISCONTINUED] diphenhydrAMINE (BENADRYL ALLERGY) 25 mg capsule Take 25 mg by mouth every 6 (six) hours as needed.  . [DISCONTINUED] loratadine (CLARITIN) 10 MG tablet Take 1 tablet (10 mg total) by mouth daily. (Patient not taking: Reported on 10/31/2014)  . [DISCONTINUED] LORazepam (ATIVAN) 1 MG tablet Place 1/2 to 1 tablet under the tongue or swallow every 6 hours as needed for nausea. Will make drowsy (Patient not taking: Reported on 02/21/2015)   No facility-administered encounter medications on file as of 04/09/2015.    Allergy:  Allergies  Allergen Reactions  . Flagyl [Metronidazole] Nausea Only    Social Hx:   Social History   Social History  . Marital Status: Single    Spouse Name: N/A  . Number of Children: 3  . Years of Education: N/A   Occupational History  . Not on file.   Social History Main Topics  . Smoking status: Former Smoker -- 0.25 packs/day    Quit date: 08/28/2014  .  Smokeless tobacco: Never Used  . Alcohol Use: 0.0 oz/week    0 Standard drinks or equivalent per week     Comment: occasional beer  . Drug Use: No  . Sexual Activity: Yes    Birth Control/ Protection: Condom   Other Topics Concern  . Not on file   Social History Narrative    Past Surgical Hx:  Past Surgical History  Procedure Laterality Date  . Colposcopy  08/30/14    and vaginal biopsy  . Robotic  assisted laparoscopic hysterectomy and salpingectomy  2012    Dr. Loni Muse, [facility]    Past Medical Hx:  Past Medical History  Diagnosis Date  . Vaginal cancer ([place])   . Radiation 10/10/2014 through 11/07/201610/04/2014 through 11/13/2014    Pelvis 45 gray   . History of brachytherapy 11/28/14, 12/05/14. 12/12/14    proximal vagina 24 gray    Past Gynecological History:  Cervical dysplasia, hysterectomy in 2012  No LMP recorded. Patient has had a hysterectomy.  Family Hx:  Family History  Problem Relation Age of Onset  . Colon cancer Mother     Review of Systems:  Constitutional  Feels well,    ENT Normal appearing ears and nares bilaterally Skin/Breast  No rash, sores, jaundice, itching, dryness Cardiovascular  No chest pain, shortness of breath, or edema  Pulmonary  No cough or wheeze.  Gastro Intestinal  No nausea, vomitting, or diarrhoea. No bright red blood per rectum, no abdominal pain, change in bowel movement, or constipation.  Genito Urinary  No frequency, urgency, dysuria, + brown discharge Musculo Skeletal  No myalgia, arthralgia, joint swelling or pain  Neurologic  No weakness, numbness, change in gait,  Psychology  No depression, anxiety, insomnia.   Vitals:  Blood pressure 148/92, pulse 66, temperature 97.8 F (36.6 C), temperature source Oral, resp. rate 20, height 5\' 8"  (1.727 m), weight 159 lb 9.6 oz (72.394 kg), SpO2 100 %.  Physical Exam: WD in NAD Neck  Supple NROM, without any enlargements.  Lymph Node Survey No cervical supraclavicular or inguinal adenopathy Cardiovascular  Pulse normal rate, regularity and rhythm. S1 and S2 normal.  Lungs  Clear to auscultation bilateraly, without wheezes/crackles/rhonchi. Good air movement.  Skin  No rash/lesions/breakdown  Psychiatry  Alert and oriented to person, place, and time  Abdomen  Normoactive bowel sounds, abdomen soft, non-tender and thin without evidence of hernia.  Back No CVA  tenderness Genito Urinary  Vulva/vagina: Normal external female genitalia.  No lesions. No discharge or bleeding.  Bladder/urethra:  No lesions or masses, well supported bladder  Vagina: radiation changes with loss of rugation and petechiae formation. Smooth walls, no lesions or masses. Edematous introitus/hymenal remnants.  Cervix: surgically absent  Uterus: surgically absent   Adnexa: no palpable masses. Rectal  Good tone, no masses no cul de sac nodularity.  Extremities  No bilateral cyanosis, clubbing or edema.   Donaciano Eva, MD   04/09/2015, 1:31 PM

## 2015-05-21 ENCOUNTER — Ambulatory Visit: Payer: BLUE CROSS/BLUE SHIELD | Admitting: Internal Medicine

## 2015-06-21 ENCOUNTER — Telehealth: Payer: Self-pay | Admitting: Oncology

## 2015-06-21 ENCOUNTER — Ambulatory Visit
Admission: RE | Admit: 2015-06-21 | Discharge: 2015-06-21 | Disposition: A | Discharge status: Home / Self Care | Payer: BLUE CROSS/BLUE SHIELD | Source: Ambulatory Visit | Attending: Radiation Oncology | Admitting: Radiation Oncology

## 2015-06-21 NOTE — Telephone Encounter (Signed)
Left a message for Kathy Little regarding her appointment with Dr. Sondra Come this morning.  Requested a return call.

## 2016-01-11 ENCOUNTER — Other Ambulatory Visit: Payer: Self-pay | Admitting: Nurse Practitioner

## 2016-03-27 ENCOUNTER — Encounter: Payer: Self-pay | Admitting: Obstetrics and Gynecology

## 2016-03-27 ENCOUNTER — Ambulatory Visit (INDEPENDENT_AMBULATORY_CARE_PROVIDER_SITE_OTHER): Payer: BLUE CROSS/BLUE SHIELD | Admitting: Obstetrics and Gynecology

## 2016-03-27 VITALS — BP 153/93 | HR 64 | Ht 67.0 in | Wt 154.0 lb

## 2016-03-27 DIAGNOSIS — Z01419 Encounter for gynecological examination (general) (routine) without abnormal findings: Secondary | ICD-10-CM | POA: Diagnosis not present

## 2016-03-27 DIAGNOSIS — Z0000 Encounter for general adult medical examination without abnormal findings: Secondary | ICD-10-CM | POA: Diagnosis not present

## 2016-03-27 NOTE — Progress Notes (Signed)
Pt needs referral for PCP. Pt needs order for mammogram.

## 2016-03-27 NOTE — Progress Notes (Signed)
Subjective:     Kathy Little is a 56 y.o. female G2P2 with BMI 24 s/p surgical menopause who is here for a comprehensive physical exam. The patient reports no problems. She is sexually active with her husband and desires STD testing. She has had a hysterectomy in 2012 as a result of complications associated with "HPV". She denies any pelvic pain or abnormal discharge. She denies any vaginal bleeding. She denies urinary incontinence  Past Medical History:  Diagnosis Date  . History of brachytherapy 11/28/14, 12/05/14. 12/12/14   proximal vagina 24 gray  . Radiation 10/10/2014 through 11/07/201610/04/2014 through 11/13/2014   Pelvis 45 gray   . Vaginal cancer [facility])    Past Surgical History:  Procedure Laterality Date  . COLPOSCOPY  08/30/14   and vaginal biopsy  . ROBOTIC ASSISTED LAPAROSCOPIC HYSTERECTOMY AND SALPINGECTOMY  2012   Dr. Loni Muse, [facility]   Family History  Problem Relation Age of Onset  . Colon cancer Mother     Social History   Social History  . Marital status: Single    Spouse name: N/A  . Number of children: 3  . Years of education: N/A   Occupational History  . Not on file.   Social History Main Topics  . Smoking status: Former Smoker    Packs/day: 0.25    Quit date: 08/28/2014  . Smokeless tobacco: Never Used  . Alcohol use 0.0 oz/week     Comment: occasional beer  . Drug use: No  . Sexual activity: Yes    Birth control/ protection: Condom   Other Topics Concern  . Not on file   Social History Narrative  . No narrative on file   Health Maintenance  Topic Date Due  . TETANUS/TDAP  03/16/1980  . PAP SMEAR  03/17/1982  . MAMMOGRAM  03/17/2011  . COLONOSCOPY  03/17/2011  . INFLUENZA VACCINE  08/07/2015  . Hepatitis C Screening  Completed  . HIV Screening  Completed    Review of Systems Pertinent items are noted in HPI.   Objective:  Blood pressure (!) 153/93, pulse 64, height 5\' 7"  (1.702 m), weight 154 lb (69.9 kg).     GENERAL:  Well-developed, well-nourished female in no acute distress.  HEENT: Normocephalic, atraumatic. Sclerae anicteric.  NECK: Supple. Normal thyroid.  LUNGS: Clear to auscultation bilaterally.  HEART: Regular rate and rhythm. BREASTS: Symmetric in size. No palpable masses or lymphadenopathy, skin changes, or nipple drainage. ABDOMEN: Soft, nontender, nondistended. No organomegaly. PELVIC: Normal external female genitalia. Vagina is atrophic and short. Vaginal vault intact. No adnexal mass or tenderness. EXTREMITIES: No cyanosis, clubbing, or edema, 2+ distal pulses.    Assessment:    Healthy female exam.      Plan:    Pap smear collected given history Records from 2012 hysterectomy requested Screening mammogram ordered STD screen ordered Patient will be contacted with any abnormal results RTC in 1 year or prn See After Visit Summary for Counseling Recommendations

## 2016-03-28 LAB — HEPATITIS C ANTIBODY

## 2016-03-28 LAB — CERVICOVAGINAL ANCILLARY ONLY
Bacterial vaginitis: NEGATIVE
Candida vaginitis: POSITIVE — AB
Chlamydia: NEGATIVE
Neisseria Gonorrhea: NEGATIVE
Trichomonas: NEGATIVE

## 2016-03-28 LAB — HEPATITIS B SURFACE ANTIGEN: HEP B S AG: NEGATIVE

## 2016-03-28 LAB — HIV ANTIBODY (ROUTINE TESTING W REFLEX): HIV SCREEN 4TH GENERATION: NONREACTIVE

## 2016-03-28 LAB — RPR: RPR: NONREACTIVE

## 2016-03-31 ENCOUNTER — Other Ambulatory Visit: Payer: Self-pay | Admitting: Obstetrics and Gynecology

## 2016-03-31 ENCOUNTER — Telehealth: Payer: Self-pay | Admitting: *Deleted

## 2016-03-31 DIAGNOSIS — Z1231 Encounter for screening mammogram for malignant neoplasm of breast: Secondary | ICD-10-CM

## 2016-03-31 LAB — CYTOLOGY - PAP
DIAGNOSIS: NEGATIVE
HPV: NOT DETECTED

## 2016-03-31 MED ORDER — FLUCONAZOLE 150 MG PO TABS: 150.0000 mg | ORAL_TABLET | Freq: Once | ORAL | 0 refills | Status: AC

## 2016-03-31 NOTE — Telephone Encounter (Signed)
LM on VM to CB

## 2016-03-31 NOTE — Telephone Encounter (Signed)
-----   Message from Mora Bellman, MD sent at 03/31/2016  9:41 AM EDT ----- Please inform patient of yeast infection- Rx e-prescribed  Please ensure patient has a PCP following her hepatitis C- chronic carrier  Thanks  Peggy

## 2016-03-31 NOTE — Telephone Encounter (Signed)
Patient notified- she did take Dr Ival Bible card and she plans to call her.

## 2016-04-22 ENCOUNTER — Ambulatory Visit: Payer: BLUE CROSS/BLUE SHIELD

## 2017-06-09 IMAGING — PT NM PET TUM IMG INITIAL (PI) SKULL BASE T - THIGH
1 of 8 series · 1 of 25 positions shown · non-contrast
Comparison: None.

CLINICAL DATA: Initial treatment strategy for newly diagnosed
poorly differentiated vaginal carcinoma.

EXAM:
NUCLEAR MEDICINE PET SKULL BASE TO THIGH
TECHNIQUE: 8.0 mCi F-18 FDG was injected intravenously. Full-ring PET imaging
was performed from the skull base to thigh after the radiotracer. CT
data was obtained and used for attenuation correction and anatomic
localization.
FASTING BLOOD GLUCOSE:  Value: 89 mg/dl

[Series 4: ct sk_thigh 5.0 b31f · axial · 5.0mm · 0.89mm/px · 1 of 229 slices shown]
[im 229/229  brain]
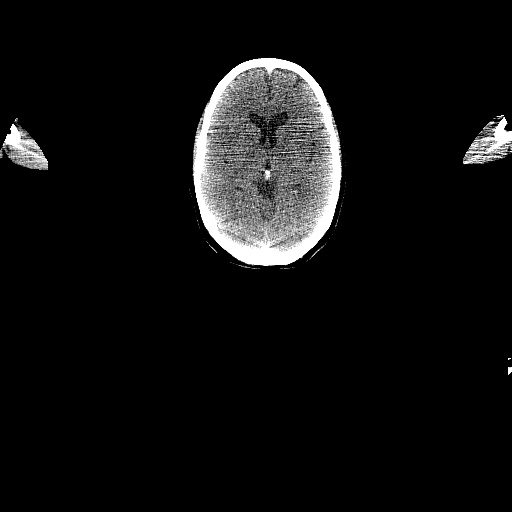

[1 of 25 positions shown; findings below may reference images not displayed]

FINDINGS: NECK

No hypermetabolic lymph nodes in the neck. Surgical plate with
interlocking screws is noted in the anterior cervical spine at C5-6.

CHEST

No hypermetabolic axillary, mediastinal or hilar nodes. There is
atherosclerosis of the thoracic aorta, the great vessels of the
mediastinum and the coronary arteries, including calcified
atherosclerotic plaque in the left anterior descending, left
circumflex and right coronary arteries. No significant pulmonary
nodules or acute consolidative airspace disease.

ABDOMEN/PELVIS

There is focal hypermetabolism with max SUV 4.3 in the anterior
vagina at the introitus, in keeping with the primary vaginal
carcinoma.

No abnormal hypermetabolic activity within the liver, pancreas,
adrenal glands, or spleen. No hypermetabolic lymph nodes in the
abdomen or pelvis. Top-normal size spleen. Non hypermetabolic 1.3 cm
left adrenal nodule (series 4/image 112) with density of 8 HU, in
keeping with a benign left adrenal adenoma. Status post
hysterectomy, with no abnormal findings at the vaginal cuff. No
adnexal mass.

SKELETON

No focal hypermetabolic activity to suggest skeletal metastasis.
IMPRESSION: 1. Focal hypermetabolism (max SUV 4.3) in the anterior vagina at the
introitus, in keeping with a primary vaginal carcinoma.
2. No hypermetabolic pelvic lymphadenopathy or distant metastatic
disease.
3. Atherosclerosis, including three-vessel coronary artery disease.
Please note that although the presence of coronary artery calcium
documents the presence of coronary artery disease, the severity of
this disease and any potential stenosis cannot be assessed on this
non-gated CT examination. Assessment for potential risk factor
modification, dietary therapy or pharmacologic therapy may be
warranted, if clinically indicated.

## 2019-02-02 ENCOUNTER — Ambulatory Visit (HOSPITAL_COMMUNITY)
Admission: EM | Admit: 2019-02-02 | Discharge: 2019-02-02 | Disposition: A | Discharge status: Home / Self Care | Payer: 59 | Attending: Family Medicine | Admitting: Family Medicine

## 2019-02-02 ENCOUNTER — Other Ambulatory Visit: Payer: Self-pay

## 2019-02-02 ENCOUNTER — Encounter (HOSPITAL_COMMUNITY): Payer: Self-pay

## 2019-02-02 DIAGNOSIS — M436 Torticollis: Secondary | ICD-10-CM

## 2019-02-02 DIAGNOSIS — S161XXA Strain of muscle, fascia and tendon at neck level, initial encounter: Secondary | ICD-10-CM

## 2019-02-02 MED ORDER — DEXAMETHASONE SODIUM PHOSPHATE 10 MG/ML IJ SOLN
10.0000 mg | Freq: Once | INTRAMUSCULAR | Status: AC
  Administered 2019-02-02: 15:00:00 10 mg via INTRAMUSCULAR

## 2019-02-02 MED ORDER — PREDNISONE 10 MG (21) PO TBPK: ORAL_TABLET | Freq: Every day | ORAL | 0 refills | Status: DC

## 2019-02-02 MED ORDER — KETOROLAC TROMETHAMINE 60 MG/2ML IM SOLN
60.0000 mg | Freq: Once | INTRAMUSCULAR | Status: AC
  Administered 2019-02-02: 60 mg via INTRAMUSCULAR

## 2019-02-02 MED ORDER — CYCLOBENZAPRINE HCL 5 MG PO TABS: 5.0000 mg | ORAL_TABLET | Freq: Two times a day (BID) | ORAL | 0 refills | Status: DC | PRN

## 2019-02-02 MED ORDER — KETOROLAC TROMETHAMINE 60 MG/2ML IM SOLN
INTRAMUSCULAR | Status: AC
  Filled 2019-02-02: qty 2

## 2019-02-02 MED ORDER — DEXAMETHASONE SODIUM PHOSPHATE 10 MG/ML IJ SOLN
INTRAMUSCULAR | Status: AC
  Filled 2019-02-02: qty 1

## 2019-02-02 NOTE — ED Provider Notes (Signed)
[place]    CSN: [id number] Arrival date & time: 02/02/19  1241      History   Chief Complaint Chief Complaint  Patient presents with  . Neck Pain    HPI Kathy Little is a 59 y.o. female history of prior tobacco use, chronic hepatitis C, presenting today for evaluation of neck pain.  Patient states the neck pain began Monday evening and has gradually worsened since.  She reports a lot of stiffness.  Denies fevers.  Denies any injury fall, heavy lifting or increase in activity triggering pain.  She does note a remote history of prior neck surgery with fusion, has not had issues with neck since.  She has tried Aleve, hot baths with Epsom salt as well as BenGay topical without relief.  She denies dizziness or lightheadedness.  Denies vision changes.  Denies chest pain.  HPI  Past Medical History:  Diagnosis Date  . History of brachytherapy 11/28/14, 12/05/14. 12/12/14   proximal vagina 24 gray  . Radiation 10/10/2014 through 11/07/201610/04/2014 through 11/13/2014   Pelvis 45 gray   . Vaginal cancer [facility])     Patient Active Problem List   Diagnosis Date Noted  . Chronic hepatitis C without hepatic coma ([place]) 10/17/2014  . Coronary artery calcification seen on CAT scan 09/17/2014  . Elevated liver function tests 09/17/2014  . Recurrent cervical cancer ([place]) 09/15/2014  . Thrombocytopenia ([place]) 09/15/2014  . Atherosclerosis of coronary artery 09/15/2014  . Thoracic aorta atherosclerosis ([place]) 09/15/2014  . Tobacco abuse, 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  . Colon polyps 09/15/2014  . Family hx of colon cancer 09/15/2014  . 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 Abnormal Pap smear of vagina 08/30/2014    Past Surgical History:  Procedure Laterality Date  . COLPOSCOPY  08/30/14   and vaginal biopsy  . ROBOTIC ASSISTED LAPAROSCOPIC HYSTERECTOMY AND SALPINGECTOMY  2012   Dr. Loni Muse, [place]    OB History    Gravida  2   Para  2   Term      Preterm      AB      Living  3     SAB      TAB      Ectopic      Multiple  1   Live Births  3            Home Medications    Prior to Admission medications   Medication Sig Start Date End Date Taking? Authorizing Provider  cyclobenzaprine (FLEXERIL) 5 MG tablet Take 1-2 tablets (5-10 mg total) by mouth 2 (two) times daily as needed for muscle spasms. 02/02/19   Eeva Schlosser C, PA-C  predniSONE (STERAPRED UNI-PAK 21 TAB) 10 MG (21) TBPK tablet Take by mouth daily. Take as directed ( 6,5,4,3,2,1) 02/02/19   Taraann Olthoff, Elesa Hacker, PA-C    Family History Family History  Problem Relation Age of Onset  . Colon cancer Mother     Social History Social History   Tobacco Use  . Smoking status: Former Smoker    Packs/day: 0.25    Quit date: 08/28/2014    Years since quitting: 4.4  . Smokeless tobacco: Never Used  Substance Use Topics  . Alcohol use: Yes    Alcohol/week: 0.0 standard drinks    Comment: occasional beer  . Drug use: No     Allergies   Flagyl [metronidazole]   Review of Systems Review of Systems  Constitutional: Negative for fatigue and fever.  Eyes:  Negative for visual disturbance.  Respiratory: Negative for shortness of breath.   Cardiovascular: Negative for chest pain.  Gastrointestinal: Negative for abdominal pain, nausea and vomiting.  Musculoskeletal: Positive for myalgias, neck pain and neck stiffness. Negative for arthralgias and joint swelling.  Skin: Negative for color change, rash and wound.  Neurological: Negative for dizziness, weakness, light-headedness and headaches.     Physical Exam Triage Vital Signs ED Triage Vitals  Enc Vitals Group     BP 02/02/19 1351 (!) 177/100     Pulse Rate 02/02/19 1351 92     Resp 02/02/19 1351 16     Temp 02/02/19 1351 98.1 F (36.7 C)     Temp Source 02/02/19 1351 Oral     SpO2 02/02/19 1351 100 %     Weight 02/02/19 1350 150 lb (68 kg)     Height --      Head Circumference --      Peak Flow --       Pain Score 02/02/19 1350 10     Pain Loc --      Pain Edu? --      Excl. in [place]? --    No data found.  Updated Vital Signs BP (!) 177/100 (BP Location: Right Arm)   Pulse 92   Temp 98.1 F (36.7 C) (Oral)   Resp 16   Wt 150 lb (68 kg)   SpO2 100%   BMI 23.49 kg/m   Visual Acuity Right Eye Distance:   Left Eye Distance:   Bilateral Distance:    Right Eye Near:   Left Eye Near:    Bilateral Near:     Physical Exam Vitals and nursing note reviewed.  Constitutional:      General: She is not in acute distress.    Appearance: She is well-developed.  HENT:     Head: Normocephalic and atraumatic.  Eyes:     Extraocular Movements: Extraocular movements intact.     Conjunctiva/sclera: Conjunctivae normal.     Pupils: Pupils are equal, round, and reactive to light.  Neck:     Comments: Diffuse tenderness to lower cervical spine midline, increased tenderness throughout bilateral trapezius musculature superiorly extending toward shoulders, more prominent on right, limited range of motion with rightward and leftward rotation Cardiovascular:     Rate and Rhythm: Normal rate and regular rhythm.     Heart sounds: No murmur.  Pulmonary:     Effort: Pulmonary effort is normal. No respiratory distress.     Breath sounds: Normal breath sounds.     Comments: Breathing comfortably at rest, CTABL, no wheezing, rales or other adventitious sounds auscultated Abdominal:     Palpations: Abdomen is soft.     Tenderness: There is no abdominal tenderness.  Musculoskeletal:     Cervical back: Neck supple.     Comments: Back: Nontender to palpation of thoracic and lumbar spine midline  Full active range of motion of shoulders although does elicit pain with movement above 90 degrees  Skin:    General: Skin is warm and dry.  Neurological:     Mental Status: She is alert.      UC Treatments / Results  Labs (all labs ordered are listed, but only abnormal results are displayed) Labs  Reviewed - No data to display  EKG   Radiology No results found.  Procedures Procedures (including critical care time)  Medications Ordered in UC Medications  ketorolac (TORADOL) injection 60 mg (60 mg Intramuscular Given 02/02/19 1515)  dexamethasone (DECADRON) injection 10 mg (10 mg Intramuscular Given 02/02/19 1515)    Initial Impression / Assessment and Plan / UC Course  I have reviewed the triage vital signs and the nursing notes.  Pertinent labs & imaging results that were available during my care of the patient were reviewed by me and considered in my medical decision making (see chart for details).     Neck pain without mechanism of injury, most likely cervical strain/inflammatory.  Do not suspect acute bony abnormality.  Treating with Toradol and Decadron IM prior to discharge followed by prednisone taper and Flexeril.  Gentle stretching.  No fevers, do not suspect meningitis at this time, advised to continue to monitor symptoms and follow-up in emergency room if developing fevers or other systemic symptoms.  Discussed strict return precautions. Patient verbalized understanding and is agreeable with plan.  Final Clinical Impressions(s) / UC Diagnoses   Final diagnoses:  Acute strain of neck muscle, initial encounter  Torticollis, acute     Discharge Instructions     We gave you an injection of Toradol and Decadron today Continue with prednisone taper over the next 6 days, begin with 6 tablets / 60 mg, decrease by 1 tablet each day until complete-6, 5, 4, 3, 2, 1 You may use flexeril as needed to help with pain. This is a muscle relaxer and causes sedation- please use only at bedtime or when you will be home and not have to drive/work Gentle stretching  Please follow-up if not improving or worsening   ED Prescriptions    Medication Sig Dispense Auth. Provider   predniSONE (STERAPRED UNI-PAK 21 TAB) 10 MG (21) TBPK tablet Take by mouth daily. Take as directed (  6,5,4,3,2,1) 21 tablet Quincie Haroon C, PA-C   cyclobenzaprine (FLEXERIL) 5 MG tablet Take 1-2 tablets (5-10 mg total) by mouth 2 (two) times daily as needed for muscle spasms. 30 tablet Raymir Frommelt, [place] C, PA-C     PDMP not reviewed this encounter.   Janith Lima, PA-C 02/02/19 1704

## 2019-02-02 NOTE — ED Notes (Signed)
177/100 reported to [employer]

## 2019-02-02 NOTE — ED Triage Notes (Signed)
Pt states she has neck pain. Pt states she has been using [employer] and it's not working.Pt states  this started Sunday night.

## 2019-02-02 NOTE — Discharge Instructions (Signed)
We gave you an injection of Toradol and Decadron today Continue with prednisone taper over the next 6 days, begin with 6 tablets / 60 mg, decrease by 1 tablet each day until complete-6, 5, 4, 3, 2, 1 You may use flexeril as needed to help with pain. This is a muscle relaxer and causes sedation- please use only at bedtime or when you will be home and not have to drive/work Gentle stretching  Please follow-up if not improving or worsening

## 2021-12-16 ENCOUNTER — Ambulatory Visit (INDEPENDENT_AMBULATORY_CARE_PROVIDER_SITE_OTHER): Payer: Commercial Managed Care - HMO

## 2021-12-16 ENCOUNTER — Ambulatory Visit (HOSPITAL_COMMUNITY)
Admission: EM | Admit: 2021-12-16 | Discharge: 2021-12-16 | Disposition: A | Discharge status: Home / Self Care | Payer: Commercial Managed Care - HMO | Attending: Internal Medicine | Admitting: Internal Medicine

## 2021-12-16 ENCOUNTER — Other Ambulatory Visit: Payer: Self-pay

## 2021-12-16 ENCOUNTER — Encounter (HOSPITAL_COMMUNITY): Payer: Self-pay | Admitting: Emergency Medicine

## 2021-12-16 DIAGNOSIS — R140 Abdominal distension (gaseous): Secondary | ICD-10-CM | POA: Insufficient documentation

## 2021-12-16 DIAGNOSIS — R609 Edema, unspecified: Secondary | ICD-10-CM | POA: Diagnosis not present

## 2021-12-16 DIAGNOSIS — R0602 Shortness of breath: Secondary | ICD-10-CM | POA: Diagnosis not present

## 2021-12-16 DIAGNOSIS — N939 Abnormal uterine and vaginal bleeding, unspecified: Secondary | ICD-10-CM | POA: Insufficient documentation

## 2021-12-16 DIAGNOSIS — R1084 Generalized abdominal pain: Secondary | ICD-10-CM | POA: Diagnosis not present

## 2021-12-16 LAB — CBC WITH DIFFERENTIAL/PLATELET
Abs Immature Granulocytes: 0.04 10*3/uL (ref 0.00–0.07)
Basophils Absolute: 0 10*3/uL (ref 0.0–0.1)
Basophils Relative: 1 %
Eosinophils Absolute: 0.1 10*3/uL (ref 0.0–0.5)
Eosinophils Relative: 3 %
HCT: 36.2 % (ref 36.0–46.0)
Hemoglobin: 12.7 g/dL (ref 12.0–15.0)
Immature Granulocytes: 1 %
Lymphocytes Relative: 15 %
Lymphs Abs: 0.5 10*3/uL — ABNORMAL LOW (ref 0.7–4.0)
MCH: 36.2 pg — ABNORMAL HIGH (ref 26.0–34.0)
MCHC: 35.1 g/dL (ref 30.0–36.0)
MCV: 103.1 fL — ABNORMAL HIGH (ref 80.0–100.0)
Monocytes Absolute: 0.3 10*3/uL (ref 0.1–1.0)
Monocytes Relative: 10 %
Neutro Abs: 2.3 10*3/uL (ref 1.7–7.7)
Neutrophils Relative %: 70 %
Platelets: 110 10*3/uL — ABNORMAL LOW (ref 150–400)
RBC: 3.51 MIL/uL — ABNORMAL LOW (ref 3.87–5.11)
RDW: 15.3 % (ref 11.5–15.5)
WBC: 3.2 10*3/uL — ABNORMAL LOW (ref 4.0–10.5)
nRBC: 0 % (ref 0.0–0.2)

## 2021-12-16 LAB — COMPREHENSIVE METABOLIC PANEL
ALT: 55 U/L — ABNORMAL HIGH (ref 0–44)
AST: 114 U/L — ABNORMAL HIGH (ref 15–41)
Albumin: 2.7 g/dL — ABNORMAL LOW (ref 3.5–5.0)
Alkaline Phosphatase: 125 U/L (ref 38–126)
Anion gap: 9 (ref 5–15)
BUN: 6 mg/dL (ref 6–20)
CO2: 25 mmol/L (ref 22–32)
Calcium: 8.6 mg/dL — ABNORMAL LOW (ref 8.9–10.3)
Chloride: 106 mmol/L (ref 98–111)
Creatinine, Ser: 0.73 mg/dL (ref 0.44–1.00)
GFR, Estimated: >60 mL/min (ref >60)
Glucose, Bld: 94 mg/dL (ref 70–99)
Potassium: 3.4 mmol/L — ABNORMAL LOW (ref 3.5–5.1)
Sodium: 140 mmol/L (ref 135–145)
Total Bilirubin: 2.5 mg/dL — ABNORMAL HIGH (ref 0.3–1.2)
Total Protein: 7.1 g/dL (ref 6.5–8.1)

## 2021-12-16 LAB — POCT URINALYSIS DIPSTICK, ED / UC
Bilirubin Urine: NEGATIVE
Glucose, UA: NEGATIVE mg/dL
Ketones, ur: NEGATIVE mg/dL
Leukocytes,Ua: NEGATIVE
Nitrite: NEGATIVE
Protein, ur: NEGATIVE mg/dL
Specific Gravity, Urine: 1.01 (ref 1.005–1.030)
Urobilinogen, UA: 2 mg/dL — ABNORMAL HIGH (ref 0.0–1.0)
pH: 6 (ref 5.0–8.0)

## 2021-12-16 NOTE — ED Triage Notes (Signed)
Symptoms started one month ago.  Complains of abdominal pain, lower back pain.  Patient reports vomiting this morning, "not food, mucus".  Last bm was this morning and described as "brown water".  Patient reports one month ago, she and several co-workers had a stomach virus and it has been down hill ever since then.   Patient reports it is better some days than other days.    Has not had any medicines for symptoms over the course of the month

## 2021-12-16 NOTE — Discharge Instructions (Signed)
You will get a call if tests are abnormal, you will not get a call if tests are normal but you can check results in MyChart if you have a MyChart account.   Please contact the GI office below to set up an appointment about your abdominal pain and distention  Get in touch with your previous primary care provider about getting back into care for your swelling and your vaginal bleeding.

## 2021-12-16 NOTE — ED Triage Notes (Addendum)
Patient admits to sob.  Patient also concerned that legs are swollen. Patient has used support stockings last night and props feet up at night.    No pcp

## 2021-12-16 NOTE — ED Provider Notes (Signed)
[facility]    CSN: [id number] Arrival date & time: 12/16/21  6314      History   Chief Complaint Chief Complaint  Patient presents with   Abdominal Pain    HPI Kathy Little is a 61 y.o. female. Pt with multiple complaints. First, her biggest concern is 1-2 weeks of generalized abd pain, abd distention, liquid diarrhea 2-3 times per day. Did have a GI illness about a month ago, but recovered fully from that before these symptoms started. No N/V. No fever. Has felt cold lately.   Also reports about 2-3 weeks ago had large amount of vaginal bleeding and passed many clots. Pt is s/p "complete hysterectomy" for cervical cancer. She sometimes gets vaginal spotting after sexual intercourse but not the volume of blood and clots she experienced recently. It has not happened since the one incident.   Also reports peripheral edema, severe over the last several days. Has started wearing support hose and that has helped a lot. Now still has mild edema but it is milder. Denies shortness of breath or chest pain or wheezing.    Abdominal Pain   Past Medical History:  Diagnosis Date   History of brachytherapy 11/28/14, 12/05/14. 12/12/14   proximal vagina 24 gray   Radiation 10/10/2014 through 11/07/201610/04/2014 through 11/13/2014   Pelvis 45 gray    Vaginal cancer [facility])     Patient Active Problem List   Diagnosis Date Noted   Chronic hepatitis C without hepatic coma ([place]) 10/17/2014   Coronary artery calcification seen on CAT scan 09/17/2014   Elevated liver function tests 09/17/2014   Recurrent cervical cancer ([place]) 09/15/2014   Thrombocytopenia ([place]) 09/15/2014   Atherosclerosis of coronary artery 09/15/2014   Thoracic aorta atherosclerosis ([place]) 09/15/2014   Tobacco abuse, 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   Colon polyps 09/15/2014   Family hx of colon cancer 09/15/2014   Amputation of finger of right hand 09/15/2014   Abnormal Pap smear of vagina 08/30/2014    Past  Surgical History:  Procedure Laterality Date   COLPOSCOPY  08/30/14   and vaginal biopsy   ROBOTIC ASSISTED LAPAROSCOPIC HYSTERECTOMY AND SALPINGECTOMY  2012   Dr. Loni Muse, [place]    OB History     Gravida  2   Para  2   Term      Preterm      AB      Living  3      SAB      IAB      Ectopic      Multiple  1   Live Births  3            Home Medications    Prior to Admission medications   Medication Sig Start Date End Date Taking? Authorizing Provider  cyclobenzaprine (FLEXERIL) 5 MG tablet Take 1-2 tablets (5-10 mg total) by mouth 2 (two) times daily as needed for muscle spasms. Patient not taking: Reported on 12/16/2021 02/02/19   Wieters, Hallie C, PA-C  predniSONE (STERAPRED UNI-PAK 21 TAB) 10 MG (21) TBPK tablet Take by mouth daily. Take as directed ( 6,5,4,3,2,1) Patient not taking: Reported on 12/16/2021 02/02/19   Janith Lima, PA-C    Family History Family History  Problem Relation Age of Onset   Colon cancer Mother     Social History Social History   Tobacco Use   Smoking status: Former    Packs/day: 0.25    Types: Cigarettes    Quit date: 08/28/2014  Years since quitting: 7.3   Smokeless tobacco: Never  Vaping Use   Vaping Use: Never used  Substance Use Topics   Alcohol use: Yes    Alcohol/week: 0.0 standard drinks of alcohol    Comment: occasional beer   Drug use: No     Allergies   Amoxicillin and Flagyl [metronidazole]   Review of Systems Review of Systems  Gastrointestinal:  Positive for abdominal pain.     Physical Exam Triage Vital Signs ED Triage Vitals  Enc Vitals Group     BP 12/16/21 0940 (!) 148/79     Pulse Rate 12/16/21 0940 90     Resp 12/16/21 0940 (!) 24     Temp 12/16/21 0940 98.6 F (37 C)     Temp Source 12/16/21 0940 Oral     SpO2 12/16/21 0940 97 %     Weight --      Height --      Head Circumference --      Peak Flow --      Pain Score 12/16/21 0937 9     Pain Loc --      Pain Edu? --       Excl. in [place]? --    No data found.  Updated Vital Signs BP (!) 148/79 (BP Location: Left Arm)   Pulse 90   Temp 98.6 F (37 C) (Oral)   Resp (!) 24   SpO2 97%   Visual Acuity Right Eye Distance:   Left Eye Distance:   Bilateral Distance:    Right Eye Near:   Left Eye Near:    Bilateral Near:     Physical Exam Constitutional:      General: She is not in acute distress.    Appearance: She is well-developed. She is not ill-appearing.  Cardiovascular:     Rate and Rhythm: Normal rate and regular rhythm.  Pulmonary:     Effort: Pulmonary effort is normal.     Breath sounds: Normal breath sounds.  Abdominal:     General: Bowel sounds are normal. There is distension.     Palpations: Abdomen is soft.     Tenderness: There is generalized abdominal tenderness. There is no guarding or rebound.  Musculoskeletal:     Right lower leg: 1+ Pitting Edema present.     Left lower leg: 1+ Pitting Edema present.  Neurological:     Mental Status: She is alert.      UC Treatments / Results  Labs (all labs ordered are listed, but only abnormal results are displayed) Labs Reviewed  POCT URINALYSIS DIPSTICK, ED / UC - Abnormal; Notable for the following components:      Result Value   Hgb urine dipstick LARGE (*)    Urobilinogen, UA 2.0 (*)    All other components within normal limits  CBC WITH DIFFERENTIAL/PLATELET  COMPREHENSIVE METABOLIC PANEL    EKG   Radiology DG Abd 2 Views  Result Date: 12/16/2021 CLINICAL DATA:  Patient with leg swelling and shortness of breath EXAM: ABDOMEN - 2 VIEW COMPARISON:  September 9,w 2016 FINDINGS: Lung bases are clear. Bowel gas pattern shows limited bowel gas in the abdomen. Scattered loops of gas-filled small bowel and suspected gas in the ascending colon. No signs of rectal gas. Upright view without signs of pneumoperitoneum. Upper margin of the RIGHT hemidiaphragm not imaged however. LEFT hemidiaphragm fully visualized. Soft tissues  grossly unremarkable. No signs of abnormal calcification over the abdomen sign for of suspected vascular calcifications in  the LEFT upper quadrant. On limited assessment there is no acute skeletal process. IMPRESSION: No definite acute findings. Paucity of gas in the abdomen with nonspecific features. Electronically Signed   By: Zetta Bills M.D.   On: 12/16/2021 11:12    Procedures Procedures (including critical care time)  Medications Ordered in UC Medications - No data to display  Initial Impression / Assessment and Plan / UC Course  I have reviewed the triage vital signs and the nursing notes.  Pertinent labs & imaging results that were available during my care of the patient were reviewed by me and considered in my medical decision making (see chart for details).    Pt is not in acute distress. Her distended abd is mildly tender to palpation. She reports sometimes it is less distended, she has less pain. Is having bowel movements although she describes them as liquid stool. Has colon polyps listed in her history - she reports these were removed years ago by GI in [place]. Does not have GI here locally.  Used to have PCP and gyn here locally but lost insurance and hasn't been in awhile. Now has insurance again. Discussed seeing PCP for peripheral edema. She should see gyn for vaginal bleeding given hx of cervical cancer and hysterectomy.   Final Clinical Impressions(s) / UC Diagnoses   Final diagnoses:  Generalized abdominal pain  Abdominal distension  Peripheral edema  Vaginal bleeding     Discharge Instructions      You will get a call if tests are abnormal, you will not get a call if tests are normal but you can check results in MyChart if you have a MyChart account.   Please contact the GI office below to set up an appointment about your abdominal pain and distention  Get in touch with your previous primary care provider about getting back into care for your swelling and  your vaginal bleeding.      ED Prescriptions   None    PDMP not reviewed this encounter.   Carvel Getting, NP 12/16/21 1250

## 2021-12-17 ENCOUNTER — Encounter: Payer: Self-pay | Admitting: Nurse Practitioner

## 2022-01-13 ENCOUNTER — Encounter: Payer: Self-pay | Admitting: *Deleted

## 2022-01-17 ENCOUNTER — Other Ambulatory Visit: Payer: Self-pay | Admitting: Nurse Practitioner

## 2022-01-17 ENCOUNTER — Ambulatory Visit (INDEPENDENT_AMBULATORY_CARE_PROVIDER_SITE_OTHER): Payer: Medicaid Other | Admitting: Nurse Practitioner

## 2022-01-17 ENCOUNTER — Encounter: Payer: Self-pay | Admitting: Nurse Practitioner

## 2022-01-17 ENCOUNTER — Other Ambulatory Visit (INDEPENDENT_AMBULATORY_CARE_PROVIDER_SITE_OTHER): Payer: Medicaid Other

## 2022-01-17 ENCOUNTER — Ambulatory Visit
Admission: RE | Admit: 2022-01-17 | Discharge: 2022-01-17 | Disposition: A | Discharge status: Home / Self Care | Payer: Medicaid Other | Source: Ambulatory Visit | Attending: Nurse Practitioner | Admitting: Nurse Practitioner

## 2022-01-17 VITALS — BP 120/92 | HR 99 | Ht 68.0 in | Wt 179.2 lb

## 2022-01-17 DIAGNOSIS — R601 Generalized edema: Secondary | ICD-10-CM

## 2022-01-17 DIAGNOSIS — R188 Other ascites: Secondary | ICD-10-CM | POA: Diagnosis not present

## 2022-01-17 DIAGNOSIS — B182 Chronic viral hepatitis C: Secondary | ICD-10-CM

## 2022-01-17 DIAGNOSIS — R1084 Generalized abdominal pain: Secondary | ICD-10-CM

## 2022-01-17 LAB — CBC WITH DIFFERENTIAL/PLATELET
Basophils Absolute: 0 10*3/uL (ref 0.0–0.1)
Basophils Relative: 0.8 % (ref 0.0–3.0)
Eosinophils Absolute: 0 10*3/uL (ref 0.0–0.7)
Eosinophils Relative: 1 % (ref 0.0–5.0)
HCT: 48 % — ABNORMAL HIGH (ref 36.0–46.0)
Hemoglobin: 15.9 g/dL — ABNORMAL HIGH (ref 12.0–15.0)
Lymphocytes Relative: 14.8 % (ref 12.0–46.0)
Lymphs Abs: 0.5 10*3/uL — ABNORMAL LOW (ref 0.7–4.0)
MCHC: 33.2 g/dL (ref 30.0–36.0)
MCV: 100.1 fl — ABNORMAL HIGH (ref 78.0–100.0)
Monocytes Absolute: 0.3 10*3/uL (ref 0.1–1.0)
Monocytes Relative: 8.2 % (ref 3.0–12.0)
Neutro Abs: 2.4 10*3/uL (ref 1.4–7.7)
Neutrophils Relative %: 75.2 % (ref 43.0–77.0)
Platelets: 120 10*3/uL — ABNORMAL LOW (ref 150.0–400.0)
RBC: 4.8 Mil/uL (ref 3.87–5.11)
RDW: 13.8 % (ref 11.5–15.5)
WBC: 3.2 10*3/uL — ABNORMAL LOW (ref 4.0–10.5)

## 2022-01-17 LAB — HEPATIC FUNCTION PANEL
ALT: 38 U/L — ABNORMAL HIGH (ref 0–35)
AST: 84 U/L — ABNORMAL HIGH (ref 0–37)
Albumin: 3.1 g/dL — ABNORMAL LOW (ref 3.5–5.2)
Alkaline Phosphatase: 128 U/L — ABNORMAL HIGH (ref 39–117)
Bilirubin, Direct: 1.2 mg/dL — ABNORMAL HIGH (ref 0.0–0.3)
Total Bilirubin: 2.7 mg/dL — ABNORMAL HIGH (ref 0.2–1.2)
Total Protein: 7.3 g/dL (ref 6.0–8.3)

## 2022-01-17 LAB — BASIC METABOLIC PANEL
BUN: 8 mg/dL (ref 6–23)
CO2: 24 mEq/L (ref 19–32)
Calcium: 8.2 mg/dL — ABNORMAL LOW (ref 8.4–10.5)
Chloride: 104 mEq/L (ref 96–112)
Creatinine, Ser: 0.66 mg/dL (ref 0.40–1.20)
GFR: 95.07 mL/min (ref >60.00)
Glucose, Bld: 99 mg/dL (ref 70–99)
Potassium: 3.1 mEq/L — ABNORMAL LOW (ref 3.5–5.1)
Sodium: 138 mEq/L (ref 135–145)

## 2022-01-17 LAB — IBC + FERRITIN
Ferritin: 252.6 ng/mL (ref 10.0–291.0)
Iron: 119 ug/dL (ref 42–145)
Saturation Ratios: 45.5 % (ref 20.0–50.0)
TIBC: 261.8 ug/dL (ref 250.0–450.0)
Transferrin: 187 mg/dL — ABNORMAL LOW (ref 212.0–360.0)

## 2022-01-17 LAB — PROTIME-INR
INR: 1.4 ratio — ABNORMAL HIGH (ref 0.8–1.0)
Prothrombin Time: 15.4 s — ABNORMAL HIGH (ref 9.6–13.1)

## 2022-01-17 MED ORDER — IOPAMIDOL (ISOVUE-300) INJECTION 61%
100.0000 mL | Freq: Once | INTRAVENOUS | Status: AC | PRN
  Administered 2022-01-17: 100 mL via INTRAVENOUS

## 2022-01-17 MED ORDER — OMEPRAZOLE 40 MG PO CPDR: 40.0000 mg | DELAYED_RELEASE_CAPSULE | Freq: Every day | ORAL | 1 refills | Status: DC

## 2022-01-17 MED ORDER — POTASSIUM CHLORIDE CRYS ER 20 MEQ PO TBCR: 20.0000 meq | EXTENDED_RELEASE_TABLET | Freq: Two times a day (BID) | ORAL | 0 refills | Status: DC

## 2022-01-17 NOTE — Addendum Note (Signed)
Addended by: Jerene Bears on: 01/17/2022 12:57 PM   Modules accepted: Level of Service

## 2022-01-17 NOTE — Patient Instructions (Signed)
_______________________________________________________  If you are age 61 or older, your body mass index should be between 23-30. Your Body mass index is 27.25 kg/m. If this is out of the aforementioned range listed, please consider follow up with your Primary Care Provider.  If you are age 58 or younger, your body mass index should be between 19-25. Your Body mass index is 27.25 kg/m. If this is out of the aformentioned range listed, please consider follow up with your Primary Care Provider.  Start a low sodium diet.   Please go the the Emergency Department if you develop Shortness of breath , abdominal pain, or black stools.  Your provider has requested that you go to the basement level for lab work before leaving today. Press "B" on the elevator. The lab is located at the first door on the left as you exit the elevator.  You have been scheduled for a CT scan of the abdomen and pelvis at [facility]. You are scheduled on 01/18/22 at [place] should arrive 15 minutes prior to your appointment time for registration.   Please follow the written instructions below on the day of your exam:   1) Do not eat anything after 1pm (4 hours prior to your test)   You may take any medications as prescribed with a small amount of water, if necessary. If you take any of the following medications: METFORMIN, GLUCOPHAGE, GLUCOVANCE, AVANDAMET, RIOMET, FORTAMET, Leon MET, JANUMET, GLUMETZA or METAGLIP, you MAY be asked to HOLD this medication 48 hours AFTER the exam.   The purpose of you drinking the oral contrast is to aid in the visualization of your intestinal tract. The contrast solution may cause some diarrhea. Depending on your individual set of symptoms, you may also receive an intravenous injection of x-ray contrast/dye. Plan on being at [facility] for 45 minutes or longer, depending on the type of exam you are having performed.   If you have any questions regarding your exam or if you  need to reschedule, you may call [facility] at [phone] between the hours of 8:00 am and 5:00 pm, Monday-Friday.    Due to recent changes in healthcare laws, you may see the results of your imaging and laboratory studies on MyChart before your provider has had a chance to review them.  We understand that in some cases there may be results that are confusing or concerning to you. Not all laboratory results come back in the same time frame and the provider may be waiting for multiple results in order to interpret others.  Please give [place] 48 hours in order for your provider to thoroughly review all the results before contacting the office for clarification of your results.   It was a pleasure to see you today!  Thank you for trusting me with your gastrointestinal care!     ________________________________________________________  The [place] [facility] providers would like to encourage you to use [facility] to communicate with providers for non-urgent requests or questions.  Due to long hold times on the telephone, sending your provider a message by [facility] may be a faster and more efficient way to get a response.  Please allow 48 business hours for a response.  Please remember that this is for non-urgent requests.  _______________________________________________________

## 2022-01-17 NOTE — Progress Notes (Addendum)
01/17/2022 Kathy Little [id number] [birth year]   CHIEF COMPLAINT: Abdominal swelling   HISTORY OF PRESENT ILLNESS: Kathy Little is a 62 year old female with a past medical history of recurrent cervical cancer (s/p subtotal hysterectomy 2012) treated with brachytherapy 2016, chronic leukopenia, thrombocytopenia, chronic hepatitis C gentoype 1a.   She presents to our office today as referred by Willeen Cass for further evaluation regarding generalized abdominal pain and distension which started early Dec. 2023 which abruptly worsened on 12/16/2021. At that time, she stated her abdomen was severely swollen. She went to the urgent care clinic 12/16/2021 and labs showed a WBC count of 3.2. Hg 12.7. MCV 103. Platelet count 110. Na+ 140. K+ 3.4. Glu 94.  Albumin 2.7.  AST 114.  ALT 55.  Total bili 2.5. An abdominal xray showed a paucity of gas in the abdomen otherwise was unremarkable. A GI referral was provided. She also noted having vaginal bleeding for 2 to 3 weeks and she was instructed to follow up with her PCP.   She is accompanied by her daughter. She endorses having nausea for the past month, intermittently vomits up clear mucous/phlegm. No coffee ground, black or frank red hematemesis. She sometimes feels her throat tightens when she eats for the past 1 to 2 weeks. She has heartburn once monthly. She feel like food sometimes sits in the top of her stomach for the past month.  She continues to have generalized abdominal pain which radiates to her mid back area.  She is passing brown solid or stringy stools most days and for the past month she passed a few loose black tarry stools 3 days weekly which last occurred yesterday. She passed a normal brown stool earlier this morning.  No bright red blood per the rectum.  She denies ever having an EGD. She underwent a colonoscopy 10 years ago in [place], [place]. which she reported showed a few polyps and she was advised to repeat a colonoscopy in 7  years which was not done.  Her mother was diagnosed with colon cancer in her early 26s.  She has a history of chronic hepatitis C genotype 1a initially diagnosed in the 1990s for which she was treated with interferon without achieving SVR. She was seen by Dr. Linus Salmons infectious disease specialist 10/2014.  At that time, hepatitis C RNA quant was [phone].  Hepatitis C genotype Ia.  Hepatitis B core total antibody positive with negative hepatitis B surface antigen.  She does not know how she contracted hepatitis C.  She has no knowledge of ever having hepatitis B infection.  She denies history of IV drug use or known sexual partners with hepatitis C.  She received a blood transfusion in 1981 during right hand/partial 3 finger amputation surgery. Dr. Linus Salmons planned on treating her chronic hepatitis C with Harvoni or Epclusa after she completed her radiation therapy for cervical cancer. She intended to follow-up with Dr. Linus Salmons after she completed her brachytherapy but she did not as she had problems with transportation.  She denies past or present history of alcohol use disorder.  She endorses drinking 1 glass of wine once monthly.  No NSAID use.  She denies having any chest pain, shortness of breath or severe abdominal pain at this time. She is hemodynamically stable.  Labs 09/19/2014: Hepatitis C RNA quant [phone].  Hepatitis C antibody positive.  Hepatitis B surface antigen negative.  Hepatitis A IgM nonreactive.  Labs 09/28/2014: Hepatitis A total antibody nonreactive.  Hepatitis B core total  antibody reactive.  HCV genotype Ia.  HIV nonreactive.     Latest Ref Rng & Units 12/16/2021   10:40 AM 10/30/2014   10:16 AM 10/23/2014   10:00 AM  CBC  WBC 4.0 - 10.5 K/uL 3.2  2.9  3.0   Hemoglobin 12.0 - 15.0 g/dL 12.7  15.2  15.3   Hematocrit 36.0 - 46.0 % 36.2  44.1  44.5   Platelets 150 - 400 K/uL 110  79  65        Latest Ref Rng & Units 12/16/2021   10:40 AM 10/30/2014   10:16 AM 10/23/2014    10:00 AM  CMP  Glucose 70 - 99 mg/dL 94  81  107   BUN 6 - 20 mg/dL 6  14.4  11.9   Creatinine 0.44 - 1.00 mg/dL 0.73  0.8  0.8   Sodium 135 - 145 mmol/L 140  141  141   Potassium 3.5 - 5.1 mmol/L 3.4  4.3  4.2   Chloride 98 - 111 mmol/L 106     CO2 22 - 32 mmol/L '25  29  29   '$ Calcium 8.9 - 10.3 mg/dL 8.6  9.6  9.2   Total Protein 6.5 - 8.1 g/dL 7.1  7.4  7.3   Total Bilirubin 0.3 - 1.2 mg/dL 2.5  0.82  0.80   Alkaline Phos 38 - 126 U/L 125  129  134   AST 15 - 41 U/L 114  91  99   ALT 0 - 44 U/L 55  103  109     IMAGE STUDIES:  Abdominal ultrasound with elastography 11/01/2014:  FINDINGS: ULTRASOUND ABDOMEN   Gallbladder: Gallbladder has a normal appearance. Gallbladder wall is 1.6 mm, within normal limits. No stones or pericholecystic fluid. No sonographic Murphy's sign.   Common bile duct: Diameter: 5.3 mm   Liver: No focal lesion identified. Within normal limits in parenchymal echogenicity.   IVC: No abnormality visualized.   Pancreas: Visualized portion unremarkable.   Spleen: 9.2 cm normal in appearance.   Right Kidney: Length: 12.5 cm. Echogenicity within normal limits. No mass or hydronephrosis visualized.   Left Kidney: Length: 11.1 cm. Echogenicity within normal limits. No mass or hydronephrosis visualized.   Abdominal aorta: 2.5 cm   Other findings: None.   ULTRASOUND HEPATIC ELASTOGRAPHY   Device: Siemens Helix VTQ   Transducer 6 C1   Patient position: Supine   Number of measurements:  10   Hepatic Segment:  Eight   Median velocity:   3.32  m/sec   IQR: 0.31   IQR/Median velocity ratio 0.09   Corresponding Metavir fibrosis score:  Some F3 and F4   Risk of fibrosis: High   Limitations of exam: None   Pertinent findings noted on other imaging exams:  None   Please note that abnormal shear wave velocities may also be identified in clinical settings other than with hepatic fibrosis, such as: acute hepatitis, elevated right heart and  central venous pressures including use of beta blockers, veno-occlusive disease (Budd-Chiari), infiltrative processes such as mastocytosis/amyloidosis/infiltrative tumor, extrahepatic cholestasis, in the post-prandial state, and liver transplantation. Correlation with patient history, laboratory data, and clinical condition recommended.   IMPRESSION: No focal liver lesions.   No acute abnormalities detected.   Median hepatic shear wave velocity is calculated at 3.32 m/sec.   Corresponding Metavir fibrosis score is some F3 and F4.   Risk of fibrosis is high.   Past Medical History:  Diagnosis Date   Atherosclerosis  Hepatitis C 1997   History of brachytherapy 11/28/14, 12/05/14. 12/12/14   proximal vagina 24 gray   Radiation 10/10/2014 through 11/07/201610/04/2014 through 11/13/2014   Pelvis 45 gray    Vaginal cancer [facility])    Past Surgical History:  Procedure Laterality Date   COLPOSCOPY  08/30/14   and vaginal biopsy   ROBOTIC ASSISTED LAPAROSCOPIC HYSTERECTOMY AND SALPINGECTOMY  2012   Dr. Loni Muse, [facility]   Social History: She is single.  She has 1 son and 2 daughters.  She works at [employer] center.  She previously smoked 1/2 pack of cigarettes daily for 30 years, quit smoking in 2016.  She drinks 1 glass of wine once monthly.  No drug use.  Family History: Mother was diagnosed with colon cancer early 90's with liver metastasis stage IV.  Maternal grandfather had pancreatic cancer.   Allergies  Allergen Reactions   Amoxicillin    Flagyl [Metronidazole] Nausea Only      Outpatient Encounter Medications as of 01/17/2022  Medication Sig   cyclobenzaprine (FLEXERIL) 5 MG tablet Take 1-2 tablets (5-10 mg total) by mouth 2 (two) times daily as needed for muscle spasms. (Patient not taking: Reported on 12/16/2021)   predniSONE (STERAPRED UNI-PAK 21 TAB) 10 MG (21) TBPK tablet Take by mouth daily. Take as directed ( 6,5,4,3,2,1) (Patient not taking: Reported on 12/16/2021)    No facility-administered encounter medications on file as of 01/17/2022.    REVIEW OF SYSTEMS:  Gen: Denies fever, sweats or chills. No weight loss but lost muscle mass.  CV: + Leg swelling. No CP.  Resp: + SOB shortness of breath. No hemoptysis.  GI: See HPI. GU : Denies urinary burning, blood in urine, increased urinary frequency or incontinence. MS: + Generalized weakness. Derm: Denies rash, itchiness, skin lesions or unhealing ulcers. Psych: Denies depression, anxiety, memory loss or confusion. Heme: Denies bruising, easy bleeding. Neuro:  Denies headaches, dizziness or paresthesias. Endo:  Denies any problems with DM, thyroid or adrenal function.  PHYSICAL EXAM: BP (!) 120/92   Pulse 99   Ht '5\' 8"'$  (1.727 m)   Wt 179 lb 4 oz (81.3 kg)   BMI 27.25 kg/m   Wt Readings from Last 3 Encounters:  01/17/22 179 lb 4 oz (81.3 kg)  02/02/19 150 lb (68 kg)  03/27/16 154 lb (69.9 kg)    General: Chronically ill-appearing 62 year old female in no acute distress. Head: Normocephalic and atraumatic. Eyes:  Sclerae non-icteric, conjunctive pink. Ears: Normal auditory acuity. Mouth: Dentition intact. No ulcers or lesions.  Neck: Supple, no lymphadenopathy or thyromegaly.  Lungs: Clear bilaterally to auscultation without wheezes, crackles or rhonchi. Heart: Regular rate and rhythm. No murmur, rub or gallop appreciated.  Abdomen: Abdomen is grossly distended with ascites, abdomen is not rigid.  Mild generalized tenderness without rebound or guarding.  No appreciable hepatosplenomegaly.  Small umbilical hernia.  Hypoactive bowel sounds to all 4 quadrants. Rectal: No blood or stool in the rectal vault. Daughter at bedside.  Musculoskeletal: Symmetrical with no gross deformities. Skin: Questionable faint jaundice. Warm and dry. No rash or lesions on visible extremities. Extremities: Anasarca edema from feet up to groin/lower abdomen. Neurological: Alert oriented x 4, no focal deficits.  No  asterixis. Psychological:  Alert and cooperative. Normal mood and affect.  ASSESSMENT AND PLAN:  79) 62 year old female with chronic hepatitis C genotype 1a with elevated LFTs, ascites and generalized abdominal pain. Failed Hep C treatment with interferon in the 1990's. 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 New onset ascites/anasarca. Her clinical presentation is concerning for decompensated cirrhosis, likely due to untreated chronic hepatitis C. At high risk for hepatocellular carcinoma.  -CTAP with contrast stat to clarify diagnosis of cirrhosis, ascites with portal hypertension and to rule out [facility] and any other intra-abdominal/pelvic malignancy. BUN/Cr will to be reviewed prior to the receiving IV contrast. -CBC, BMP, hepatic panel, PT/INR stat  -Hepatic panel, Hep C RNA quant, Hep B DNA quant, Hep B surface antigen, Hep B surface antibody,  Hep A total antibody, ANA, SMA, IgG, AMA, iron, ferritin, A1AT and ceruloplasmin level.  -EGD to assess for esophageal/gastric varices, timing to be determined after the above lab and CT results received  -Diagnostic/therapeutic paracentesis to be scheduled after the above labs and CTAP results received. Paracentesis/ascitic fluid labs to include cell count with differential, Gram stain, aerobic/anaerobic culture, albumin, protein and cytology -Patient to go to the ED if she develops CP, SOB, severe abdominal pain or black stools  -Low threshold for hospital admission -Eventual ECHO to rule out right sided heart failure -Furosemide/Spironolactone dose to be determined after BMP reviewed -2gm low sodium diet -No alcohol  -No NSAIDs   2) Past hepatitis B with positive Hep B core total antibody level 09/2014 -Hep B DNA quant and Hep B surface antigen level as ordered above to rule out reactivation of Hep B.   3) Black stools 3 days weekly x 1 month, last occurred yesterday. Rectal exam today, no  blood or stool in the rectal vault. -See plan in # 1 -Omeprazole '40mg'$  one po QD  4) Colon cancer screening  -Colonoscopy, timing to be determined   5) History of cervical cancer   Today's encounter was 40 minutes which included precharting, chart/result review, history/exam, face-to-face time used for counseling, formulating a treatment plan with follow-up and documentation.     CC:  Marlis Edelson   Addendum: Presentation consistent with decompensated cirrhosis Agree with plans for CT abdomen pelvis, upper endoscopy, large-volume paracentesis, soon initiation of diuretic.  Likely furosemide 40 mg and spironolactone 100 mg daily depending on renal function. Low-sodium diet important Follow-up hepatitis B serology Eventual colonoscopy Reviewed and agree with assessment and management plan. Pyrtle, Lajuan Lines, MD

## 2022-01-18 ENCOUNTER — Other Ambulatory Visit (HOSPITAL_BASED_OUTPATIENT_CLINIC_OR_DEPARTMENT_OTHER): Payer: Medicaid Other

## 2022-01-21 ENCOUNTER — Other Ambulatory Visit (INDEPENDENT_AMBULATORY_CARE_PROVIDER_SITE_OTHER): Payer: Medicaid Other

## 2022-01-21 ENCOUNTER — Other Ambulatory Visit: Payer: Self-pay

## 2022-01-21 ENCOUNTER — Other Ambulatory Visit: Payer: Self-pay | Admitting: Nurse Practitioner

## 2022-01-21 DIAGNOSIS — R188 Other ascites: Secondary | ICD-10-CM

## 2022-01-21 DIAGNOSIS — K7460 Unspecified cirrhosis of liver: Secondary | ICD-10-CM

## 2022-01-21 LAB — BASIC METABOLIC PANEL
BUN: 8 mg/dL (ref 6–23)
CO2: 25 mEq/L (ref 19–32)
Calcium: 8.2 mg/dL — ABNORMAL LOW (ref 8.4–10.5)
Chloride: 103 mEq/L (ref 96–112)
Creatinine, Ser: 0.76 mg/dL (ref 0.40–1.20)
GFR: 84.91 mL/min (ref >60.00)
Glucose, Bld: 110 mg/dL — ABNORMAL HIGH (ref 70–99)
Potassium: 3.7 mEq/L (ref 3.5–5.1)
Sodium: 134 mEq/L — ABNORMAL LOW (ref 135–145)

## 2022-01-22 ENCOUNTER — Other Ambulatory Visit: Payer: Self-pay

## 2022-01-22 ENCOUNTER — Ambulatory Visit (HOSPITAL_COMMUNITY)
Admission: RE | Admit: 2022-01-22 | Discharge: 2022-01-22 | Disposition: A | Discharge status: Home / Self Care | Payer: Medicaid Other | Source: Ambulatory Visit | Attending: Nurse Practitioner | Admitting: Nurse Practitioner

## 2022-01-22 DIAGNOSIS — K7460 Unspecified cirrhosis of liver: Secondary | ICD-10-CM | POA: Insufficient documentation

## 2022-01-22 DIAGNOSIS — R188 Other ascites: Secondary | ICD-10-CM

## 2022-01-22 HISTORY — PX: IR PARACENTESIS: IMG2679

## 2022-01-22 LAB — GRAM STAIN: Gram Stain: NONE SEEN

## 2022-01-22 LAB — ALBUMIN, PLEURAL OR PERITONEAL FLUID: Albumin, Fluid: <1.5 g/dL

## 2022-01-22 LAB — HEPATITIS A ANTIBODY, TOTAL: Hepatitis A AB,Total: REACTIVE — AB

## 2022-01-22 LAB — HEPATITIS B DNA, ULTRAQUANTITATIVE, PCR
Hepatitis B DNA: NOT DETECTED IU/mL
Hepatitis B virus DNA: NOT DETECTED Log IU/mL

## 2022-01-22 LAB — BODY FLUID CELL COUNT WITH DIFFERENTIAL
Lymphs, Fluid: 18 %
Monocyte-Macrophage-Serous Fluid: 55 % (ref 50–90)
Neutrophil Count, Fluid: 27 % — ABNORMAL HIGH (ref 0–25)
Total Nucleated Cell Count, Fluid: 39 cu mm (ref 0–1000)

## 2022-01-22 LAB — ANTI-SMOOTH MUSCLE ANTIBODY, IGG: Actin (Smooth Muscle) Antibody (IGG): <20 U (ref <20)

## 2022-01-22 LAB — ANTI-NUCLEAR AB-TITER (ANA TITER)
ANA TITER: 1:80 {titer} — ABNORMAL HIGH
ANA Titer 1: 1:40 {titer} — ABNORMAL HIGH

## 2022-01-22 LAB — CERULOPLASMIN: Ceruloplasmin: 30 mg/dL (ref 18–53)

## 2022-01-22 LAB — MITOCHONDRIAL ANTIBODIES: Mitochondrial M2 Ab, IgG: <=20 U (ref <=20.0)

## 2022-01-22 LAB — HEPATITIS B SURFACE ANTIGEN: Hepatitis B Surface Ag: NONREACTIVE

## 2022-01-22 LAB — HEPATITIS B SURFACE ANTIBODY,QUALITATIVE: Hep B S Ab: NONREACTIVE

## 2022-01-22 LAB — PROTEIN, PLEURAL OR PERITONEAL FLUID: Total protein, fluid: <3 g/dL

## 2022-01-22 LAB — ALPHA-1-ANTITRYPSIN: A-1 Antitrypsin, Ser: 165 mg/dL (ref 83–199)

## 2022-01-22 LAB — IGG: IgG (Immunoglobin G), Serum: 2430 mg/dL — ABNORMAL HIGH (ref 600–1640)

## 2022-01-22 LAB — AFP TUMOR MARKER: AFP-Tumor Marker: 102.9 ng/mL — ABNORMAL HIGH

## 2022-01-22 LAB — HEPATITIS C RNA QUANTITATIVE
HCV Quantitative Log: 6.08 log IU/mL — ABNORMAL HIGH
HCV RNA, PCR, QN: 1200000 IU/mL — ABNORMAL HIGH

## 2022-01-22 LAB — ANA: Anti Nuclear Antibody (ANA): POSITIVE — AB

## 2022-01-22 MED ORDER — ALBUMIN HUMAN 25 % IV SOLN
INTRAVENOUS | Status: AC
  Administered 2022-01-22: 50 g via INTRAVENOUS
  Filled 2022-01-22: qty 200

## 2022-01-22 MED ORDER — ALBUMIN HUMAN 25 % IV SOLN
50.0000 g | Freq: Once | INTRAVENOUS | Status: AC
  Filled 2022-01-22: qty 200

## 2022-01-22 MED ORDER — SPIRONOLACTONE 100 MG PO TABS: 100.0000 mg | ORAL_TABLET | Freq: Every day | ORAL | 0 refills | Status: DC

## 2022-01-22 MED ORDER — FUROSEMIDE 40 MG PO TABS: 40.0000 mg | ORAL_TABLET | Freq: Every day | ORAL | 0 refills | Status: DC

## 2022-01-22 MED ORDER — LIDOCAINE HCL 1 % IJ SOLN
INTRAMUSCULAR | Status: AC
  Administered 2022-01-22: 10 mL
  Filled 2022-01-22: qty 20

## 2022-01-22 MED ORDER — POTASSIUM CHLORIDE CRYS ER 20 MEQ PO TBCR: 20.0000 meq | EXTENDED_RELEASE_TABLET | Freq: Every day | ORAL | 0 refills | Status: DC

## 2022-01-22 NOTE — Procedures (Signed)
PROCEDURE SUMMARY:  Successful US guided paracentesis from left lateral abdomen.  Yielded 6.0 liters of yellow, clear fluid.  No immediate complications.  Pt tolerated well.  Procedure was stopped at 6.0 liters, prior to removal of all fluid, due to this being patient's first paracentesis.  To receive albumin per protocol.   Specimen was sent for labs.  EBL < 18m  KDocia BarrierPA-C 01/22/2022 9:03 AM

## 2022-01-23 ENCOUNTER — Telehealth: Payer: Self-pay | Admitting: Nurse Practitioner

## 2022-01-23 ENCOUNTER — Other Ambulatory Visit: Payer: Self-pay

## 2022-01-23 DIAGNOSIS — I8500 Esophageal varices without bleeding: Secondary | ICD-10-CM

## 2022-01-23 NOTE — Telephone Encounter (Signed)
Great news.

## 2022-01-23 NOTE — Telephone Encounter (Signed)
Pt stated that she is feeling much better after the paracentesis yesterday and the swelling in her abdomen and legs are much better:  Pt was made aware of Carl Best NP recommendations for an EGD with Dr. Hilarie Fredrickson: Pt was scheduled for the EGD on 02/17/2022 at 4:00 pm with Dr Hilarie Fredrickson: Pt made aware: Prep instructions were created for pt and sent to pt via my chart. Pt made aware: Ambulatory referral to GI Placed in Epic. Pt verbalized understanding with all questions answered.

## 2022-01-23 NOTE — Telephone Encounter (Signed)
Kathy Little, I attempted to contact patient at this time but I did not reach her directly. Please call her this afternoon and obtain a symptom update after completing a paracentesis yesterday. Please schedule her for an EGD to survey for esophageal varices with Dr. Hilarie Fredrickson. I previously discussed this procedure with the patient and her daughter. I will gladly discuss the EGD with her further if she wishes. I will contact her regarding plans for hepatitis C treatment once Dr. Hilarie Fredrickson reviews her final lab report. THX

## 2022-01-24 ENCOUNTER — Other Ambulatory Visit: Payer: Self-pay

## 2022-01-24 DIAGNOSIS — R188 Other ascites: Secondary | ICD-10-CM

## 2022-01-24 LAB — CYTOLOGY - NON PAP

## 2022-01-24 NOTE — Telephone Encounter (Signed)
Pt was contacted to better understand my chart message sent. Pt was notified to come by our lab on 01/28/2022: Please advise on Liver Specialist appt pt is referring to:

## 2022-01-24 NOTE — Telephone Encounter (Signed)
HI Remo Lipps, I sent you a separate message this afternoon regarding referral to [facility] to see Roosevelt Locks NP for Hep C treatment. Refer to lab result with notes.

## 2022-01-27 LAB — CULTURE, BODY FLUID W GRAM STAIN -BOTTLE: Culture: NO GROWTH

## 2022-01-27 NOTE — Telephone Encounter (Signed)
Noted  

## 2022-01-28 ENCOUNTER — Other Ambulatory Visit (INDEPENDENT_AMBULATORY_CARE_PROVIDER_SITE_OTHER): Payer: Medicaid Other

## 2022-01-28 DIAGNOSIS — R188 Other ascites: Secondary | ICD-10-CM | POA: Diagnosis not present

## 2022-01-28 DIAGNOSIS — K7460 Unspecified cirrhosis of liver: Secondary | ICD-10-CM

## 2022-01-28 LAB — BASIC METABOLIC PANEL
BUN: 11 mg/dL (ref 6–23)
CO2: 26 mEq/L (ref 19–32)
Calcium: 8.7 mg/dL (ref 8.4–10.5)
Chloride: 102 mEq/L (ref 96–112)
Creatinine, Ser: 0.68 mg/dL (ref 0.40–1.20)
GFR: 94.36 mL/min (ref >60.00)
Glucose, Bld: 123 mg/dL — ABNORMAL HIGH (ref 70–99)
Potassium: 4.6 mEq/L (ref 3.5–5.1)
Sodium: 136 mEq/L (ref 135–145)

## 2022-01-29 LAB — HEPATITIS B CORE ANTIBODY, TOTAL: Hep B Core Total Ab: REACTIVE — AB

## 2022-02-03 ENCOUNTER — Other Ambulatory Visit (INDEPENDENT_AMBULATORY_CARE_PROVIDER_SITE_OTHER): Payer: Medicaid Other

## 2022-02-03 ENCOUNTER — Encounter: Payer: Self-pay | Admitting: Nurse Practitioner

## 2022-02-03 ENCOUNTER — Ambulatory Visit (INDEPENDENT_AMBULATORY_CARE_PROVIDER_SITE_OTHER): Payer: Medicaid Other | Admitting: Nurse Practitioner

## 2022-02-03 VITALS — BP 100/60 | HR 84 | Ht 67.0 in | Wt 156.0 lb

## 2022-02-03 DIAGNOSIS — B1920 Unspecified viral hepatitis C without hepatic coma: Secondary | ICD-10-CM | POA: Diagnosis not present

## 2022-02-03 DIAGNOSIS — K7460 Unspecified cirrhosis of liver: Secondary | ICD-10-CM

## 2022-02-03 LAB — CBC
HCT: 44.2 % (ref 36.0–46.0)
Hemoglobin: 14.9 g/dL (ref 12.0–15.0)
MCHC: 33.7 g/dL (ref 30.0–36.0)
MCV: 100.1 fl — ABNORMAL HIGH (ref 78.0–100.0)
Platelets: 114 10*3/uL — ABNORMAL LOW (ref 150.0–400.0)
RBC: 4.42 Mil/uL (ref 3.87–5.11)
RDW: 14 % (ref 11.5–15.5)
WBC: 3.4 10*3/uL — ABNORMAL LOW (ref 4.0–10.5)

## 2022-02-03 LAB — BASIC METABOLIC PANEL
BUN: 9 mg/dL (ref 6–23)
CO2: 27 mEq/L (ref 19–32)
Calcium: 8.7 mg/dL (ref 8.4–10.5)
Chloride: 101 mEq/L (ref 96–112)
Creatinine, Ser: 0.63 mg/dL (ref 0.40–1.20)
GFR: 96.11 mL/min (ref >60.00)
Glucose, Bld: 88 mg/dL (ref 70–99)
Potassium: 4 mEq/L (ref 3.5–5.1)
Sodium: 136 mEq/L (ref 135–145)

## 2022-02-03 LAB — MAGNESIUM: Magnesium: 1.9 mg/dL (ref 1.5–2.5)

## 2022-02-03 LAB — PROTIME-INR
INR: 1.4 ratio — ABNORMAL HIGH (ref 0.8–1.0)
Prothrombin Time: 14.8 s — ABNORMAL HIGH (ref 9.6–13.1)

## 2022-02-03 NOTE — Patient Instructions (Addendum)
You have been scheduled for an MRI at [facility], 1st floor, Radiology. Your appointment is scheduled on ______________ at ______________. Please arrive 15 minutes prior to your appointment time for registration purposes. Please make certain not to have anything to eat or drink ____ hours prior to your test. In addition, if you have any metal in your body, have a pacemaker or defibrillator, please be sure to let your ordering physician know. This test typically takes 45 minutes to 1 hour to complete. Should you need to reschedule, please call [phone]. Your provider has requested that you go to the basement level for lab work before leaving today. Press "B" on the elevator. The lab is located at the first door on the left as you exit the elevator.  We have sent over your referral to Gynecology.  Increase protein in diet.  Continue Furosemide & Spirolactone once daily.  Due to recent changes in healthcare laws, you may see the results of your imaging and laboratory studies on MyChart before your provider has had a chance to review them.  We understand that in some cases there may be results that are confusing or concerning to you. Not all laboratory results come back in the same time frame and the provider may be waiting for multiple results in order to interpret others.  Please give [place] 48 hours in order for your provider to thoroughly review all the results before contacting the office for clarification of your results.    Thank you for trusting me with your gastrointestinal care!   Carl Best, CRNP

## 2022-02-03 NOTE — Progress Notes (Signed)
02/03/2022 Neftali Thurow [id number] 1960/06/02   Chief Complaint: Cirrhosis follow up  History of Present Illness: Vernelle Wisner is a 62 year old female with a past medical history of recurrent cervical cancer s/p subtotal hysterectomy 2012 treated with brachytherapy 2016, chronic leukopenia, thrombocytopenia, chronic hepatitis C gentoype 1a who was recently diagnosed with decompensated cirrhosis. I initially saw Ms. Arguijo in office on 01/17/2022 due to having abdominal pain, abdominal swelling, melenic stools and vaginal bleeding, refer to that office visit for comprehensive history review.  Laboratory studies showed a HCV RNA level of 1,200,000. Hep B core total antibody positive, Hep B surface antigen negative, Hep B surface antibody negative and Hep B DNA undetectable. Hepatitis A antibody reactive.  Total bili 2.7.  Alk phos 128.  AST 84.  ALT 38.  Albumin 3.1.  AFP 102.9. Elevated IgG level of 2,430. ANA positive. SMA < 20. AMA < 20. Ceruloplasmin 30. A1At 165. Iron 119. Ferritin 252.6. INR 1.4. Normal renal function. K+ 3.1, corrected with oral KCL. MELD 14. CTAP with contrast 01/17/2022 identified cirrhosis with a large amount of ascites without hepatoma or splenomegaly S/P paracentesis 01/22/2022, 6L of peritoneal fluid removed, received Albumin 50gm IV. No SBP. Estimated SAAG > 1.1 consistent with portal hypertension/cirrhosis. Peritoneal protein < 3. Cytology negative for malignant cells. Aerobic/anaerobic peritoneal fluid cultures and gram stain were negative. Started on Furosemide '40mg'$  QD and Spironolactone '100mg'$  QD. Repeat K+ 4.6 on 1/23. KCL 64mq on hold until repeat K+ done today.  EGD to survey for esophageal/gastric varices and to assess for etiology of reported melenic stools scheduled 2/20/204. [facility].   She presents today for further follow-up.  She is accompanied by her daughter.  She reported  feeling significantly better following her paracentesis 01/22/2022. However, over the past few days her abdomen is becoming more distended.  She feels tightness under the right and left rib cage due to her abdominal distention.  No chest pain or shortness of breath.  No further black stools.  In hindsight, she now believes her black stools may have been from eating blueberries.  No bright red rectal bleeding.  No further vaginal bleeding.  She is adhering to a low sodium diet.  She has increased the protein in her diet.  She lost 23 pounds following her paracentesis and after starting diuretics. No issues with confusion.  She complains of cramping in her legs at nighttime for the past few nights.     Latest Ref Rng & Units 01/28/2022   11:21 AM 01/21/2022    8:33 AM 01/17/2022   10:53 AM  CMP  Glucose 70 - 99 mg/dL 123  110  99   BUN 6 - 23 mg/dL '11  8  8   '$ Creatinine 0.40 - 1.20 mg/dL 0.68  0.76  0.66   Sodium 135 - 145 mEq/L 136  134  138   Potassium 3.5 - 5.1 mEq/L 4.6  3.7  3.1   Chloride 96 - 112 mEq/L 102  103  104   CO2 19 - 32 mEq/L '26  25  24   '$ Calcium 8.4 - 10.5 mg/dL 8.7  8.2  8.2   Total Protein 6.0 - 8.3 g/dL   7.3   Total Bilirubin 0.2 - 1.2 mg/dL   2.7   Alkaline Phos 39 - 117 U/L   128   AST 0 - 37 U/L   84   ALT  0 - 35 U/L   38        Latest Ref Rng & Units 01/17/2022   10:53 AM 12/16/2021   10:40 AM 10/30/2014   10:16 AM  CBC  WBC 4.0 - 10.5 K/uL 3.2  3.2  2.9   Hemoglobin 12.0 - 15.0 g/dL 15.9  12.7  15.2   Hematocrit 36.0 - 46.0 % 48.0  36.2  44.1   Platelets 150.0 - 400.0 K/uL 120.0  110  79     Labs 09/19/2014: Hepatitis C RNA quant 9,604,540.  Hepatitis C antibody positive.  Hepatitis B surface antigen negative.  Hepatitis A IgM nonreactive.   Labs 09/28/2014: Hepatitis A total antibody nonreactive.  Hepatitis B core total antibody reactive.  HCV genotype Ia.  HIV nonreactive.  IMAGE STUDIES:  CTAP with contrast 01/17/2022:  FINDINGS: Lower chest: Clear lung  bases. No significant pleural or pericardial effusion. Trace bilateral pleural effusions. There is a moderate-sized hiatal hernia.   Hepatobiliary: Since the remote PET-CT, changes of advanced hepatic cirrhosis have developed. The liver is shrunken with diffuse contour irregularity. No arterial phase enhancing lesions are identified. The gallbladder is incompletely distended with mild wall thickening. No evidence of calcified gallstone or biliary dilatation.   Pancreas: Unremarkable. No pancreatic ductal dilatation or surrounding inflammatory changes.   Spleen: Normal in size without focal abnormality.   Adrenals/Urinary Tract: Both adrenal glands appear normal. No evidence of urinary tract calculus, suspicious renal lesion or hydronephrosis. The bladder is nearly empty, without apparent abnormality.   Stomach/Bowel: Enteric contrast was administered and has passed into the distal colon. The stomach appears unremarkable for its degree of distention. Mild diffuse small bowel wall thickening attributed to underlying liver disease. No bowel distension or evidence of focal surrounding inflammation. The appendix appears normal.   Vascular/Lymphatic: Mildly prominent lymph nodes in the porta hepatis and retroperitoneum, likely reactive. No pelvic lymphadenopathy. Aortic and branch vessel atherosclerosis without evidence of large vessel occlusion or aneurysm. The portal, superior mesenteric and splenic veins are patent.   Reproductive: Hysterectomy. No adnexal mass. No recurrent vaginal mass identified.   Other: A large amount of ascites has developed. There is edema throughout the mesenteric and subcutaneous fat. No peritoneal nodularity identified.   Musculoskeletal: No acute or significant osseous findings. There is a stable bone island in the left iliac crest.   IMPRESSION: 1. Interval development of advanced hepatic cirrhosis with large amount of ascites and diffuse soft  tissue edema. No focal hepatic lesions are identified. If not recently performed, consider paracentesis for further evaluation. 2. Mild diffuse small bowel wall thickening, likely attributed to underlying liver disease. 3. No evidence of metastatic disease. 4. Moderate-sized hiatal hernia. 5. Mild gallbladder wall thickening, likely related to underlying liver disease. 6.  Aortic Atherosclerosis.   Current Outpatient Medications on File Prior to Visit  Medication Sig Dispense Refill   furosemide (LASIX) 40 MG tablet Take 1 tablet (40 mg total) by mouth daily. 30 tablet 0   omeprazole (PRILOSEC) 40 MG capsule Take 1 capsule (40 mg total) by mouth daily. 30 capsule 1   spironolactone (ALDACTONE) 100 MG tablet Take 1 tablet (100 mg total) by mouth daily. 30 tablet 0   No current facility-administered medications on file prior to visit.   Allergies  Allergen Reactions   Amoxicillin    Flagyl [Metronidazole] Nausea Only    Current Medications, Allergies, Past Medical History, Past Surgical History, Family History and Social History were reviewed in [employer] record.  Review of Systems:   Constitutional: + Weight loss.  Respiratory: Negative for shortness of breath.   Cardiovascular: Negative for chest pain, palpitations and leg swelling.  Gastrointestinal: See HPI.  Musculoskeletal: Negative for back pain or muscle aches.  Neurological: Negative for dizziness, headaches or paresthesias.   Physical Exam: BP 100/60   Pulse 84   Ht '5\' 7"'$  (1.702 m)   Wt 156 lb (70.8 kg)   BMI 24.43 kg/m  Wt Readings from Last 3 Encounters:  02/03/22 156 lb (70.8 kg)  01/17/22 179 lb 4 oz (81.3 kg)  02/02/19 150 lb (68 kg)     General: Chronically ill 61 year old female in no acute distress. Head: Normocephalic and atraumatic. Eyes: No scleral icterus. Conjunctiva pink . Ears: Normal auditory acuity. Mouth: Dentition intact. No ulcers or lesions.  Lungs: Clear  throughout to auscultation. Heart: Regular rate and rhythm, no murmur. Abdomen: Distended, less ascites when compared to initial exam.  Abdomen is not tense.  Mild tenderness to the LUQ/rib cage border without rebound or guarding. No masses or hepatomegaly. Normal bowel sounds x 4 quadrants.  Rectal: Deferred. Musculoskeletal: Right hand with partial digit amputations.  Extremities: No edema. Neurological: Alert oriented x 4. No focal deficits.  No asterixis. Psychological: Alert and cooperative. Normal mood and affect  Assessment and Recommendations:  43) 62 year old female with chronic hepatitis C genotype 1a with concomitant Hep B core total antibody positivity newly diagnosed with decompensated cirrhosis. Failed Hep C treatment with Interferon in the 1990's. MELD Na+ 14. Hep C RNA quant 1,200,000. AFP 102.9. CTAP showed cirrhosis with ascites, no hepatoma. ANA positive. IgG 2,430. Normal SMA. S/P paracentesis 01/22/2022 (6L of peritoneal fluid removed). No evidence of SBP and cytology was negative for malignancy. No overt hepatic encephalopathy.  -Continue Furosemide 40 mg daily and Spironolactone 100 mg daily, further adjustments to be determined after BMP results reviewed -BMP, magnesium and INR -Proceed with EGD as scheduled 02/25/2022 -Elevated AFP level of 102.9. AFP may be elevated due to chronic HCV infection and does not necessarily imply [place], however, close surveillance recommended. Repeat AFP in 1 month. Liver MRI w/wo contrast. -2gm low sodium diet,  protein intake 1.2 - 1.5g/kg day -No alcohol  -No NSAIDs  -Therapeutic paracentesis as needed -Referred to Roosevelt Locks NP [facility] for further hepatology evaluation, to treat chronic hepatitis C and possible liver biopsy to rule out AIH with + ANA and elevated IgG levels  Hep B core total antibody positive. Hep B surface antigen negative, Hep B surface antibody negative and Hep B DNA undetectable. These results indicate past  hepatitis B exposure/infection without serological evidence of current active HBV replication. The loss of Hep B surface antibody is not clearly understood.  -Proceed with hepatology consult as noted above  Thrombocytopenia, stable  Coagulopathy. INR 1.4.   Black stools 3 days weekly x 1 month, last occurred 01/16/2022, no further black stools since then. -Proceed with EGD as scheduled  -Continue Omeprazole '40mg'$  one po QD    Colon cancer screening. CTAP 01/17/2022 showed mild diffuse small bowel wall thickening, likely attributed to underlying liver disease. Mother with history of colon cancer. -Colonoscopy, timing to be determined    History of cervical cancer with recent vaginal bleeding. No further vaginal bleeding  -Refer to [facility]

## 2022-02-04 NOTE — Addendum Note (Signed)
Addended by: Jerene Bears on: 02/04/2022 04:42 PM   Modules accepted: Level of Service

## 2022-02-04 NOTE — Progress Notes (Signed)
Addendum: Reviewed and agree with assessment and management plan. Agustine Rossitto M, MD  

## 2022-02-11 ENCOUNTER — Other Ambulatory Visit: Payer: Self-pay

## 2022-02-11 DIAGNOSIS — R188 Other ascites: Secondary | ICD-10-CM

## 2022-02-11 DIAGNOSIS — R601 Generalized edema: Secondary | ICD-10-CM

## 2022-02-17 ENCOUNTER — Encounter: Payer: Medicaid Other | Admitting: Internal Medicine

## 2022-02-17 ENCOUNTER — Ambulatory Visit (HOSPITAL_COMMUNITY)
Admission: RE | Admit: 2022-02-17 | Discharge: 2022-02-17 | Disposition: A | Discharge status: Home / Self Care | Payer: Medicaid Other | Source: Ambulatory Visit | Attending: Nurse Practitioner | Admitting: Nurse Practitioner

## 2022-02-17 DIAGNOSIS — K7460 Unspecified cirrhosis of liver: Secondary | ICD-10-CM | POA: Insufficient documentation

## 2022-02-17 DIAGNOSIS — B1920 Unspecified viral hepatitis C without hepatic coma: Secondary | ICD-10-CM | POA: Diagnosis present

## 2022-02-17 MED ORDER — GADOBUTROL 1 MMOL/ML IV SOLN
7.0000 mL | Freq: Once | INTRAVENOUS | Status: AC | PRN
  Administered 2022-02-17: 7 mL via INTRAVENOUS

## 2022-02-18 ENCOUNTER — Other Ambulatory Visit (INDEPENDENT_AMBULATORY_CARE_PROVIDER_SITE_OTHER): Payer: Medicaid Other

## 2022-02-18 DIAGNOSIS — R601 Generalized edema: Secondary | ICD-10-CM | POA: Diagnosis not present

## 2022-02-18 DIAGNOSIS — R188 Other ascites: Secondary | ICD-10-CM

## 2022-02-18 LAB — BASIC METABOLIC PANEL
BUN: 14 mg/dL (ref 6–23)
CO2: 26 mEq/L (ref 19–32)
Calcium: 8.7 mg/dL (ref 8.4–10.5)
Chloride: 101 mEq/L (ref 96–112)
Creatinine, Ser: 0.67 mg/dL (ref 0.40–1.20)
GFR: 94.66 mL/min (ref >60.00)
Glucose, Bld: 93 mg/dL (ref 70–99)
Potassium: 3.6 mEq/L (ref 3.5–5.1)
Sodium: 134 mEq/L — ABNORMAL LOW (ref 135–145)

## 2022-02-19 ENCOUNTER — Other Ambulatory Visit: Payer: Self-pay

## 2022-02-19 DIAGNOSIS — K7460 Unspecified cirrhosis of liver: Secondary | ICD-10-CM

## 2022-02-19 DIAGNOSIS — R188 Other ascites: Secondary | ICD-10-CM

## 2022-02-19 DIAGNOSIS — R601 Generalized edema: Secondary | ICD-10-CM

## 2022-02-19 MED ORDER — SPIRONOLACTONE 100 MG PO TABS: 200.0000 mg | ORAL_TABLET | Freq: Every day | ORAL | 0 refills | Status: DC

## 2022-02-19 MED ORDER — FUROSEMIDE 40 MG PO TABS: 80.0000 mg | ORAL_TABLET | Freq: Every day | ORAL | 0 refills | Status: DC

## 2022-02-19 NOTE — Telephone Encounter (Signed)
Spoke with Pt. Pt stated that she spoke with pharmacy and has no other questions.

## 2022-02-19 NOTE — Telephone Encounter (Signed)
Spoke with Pt. Documented in result note.  Pt verbalized understanding with all questions answered.

## 2022-02-21 ENCOUNTER — Ambulatory Visit (HOSPITAL_COMMUNITY)
Admission: RE | Admit: 2022-02-21 | Discharge: 2022-02-21 | Disposition: A | Discharge status: Home / Self Care | Payer: Medicaid Other | Source: Ambulatory Visit | Attending: Nurse Practitioner | Admitting: Nurse Practitioner

## 2022-02-21 DIAGNOSIS — R601 Generalized edema: Secondary | ICD-10-CM

## 2022-02-21 DIAGNOSIS — K7460 Unspecified cirrhosis of liver: Secondary | ICD-10-CM | POA: Diagnosis present

## 2022-02-21 DIAGNOSIS — R188 Other ascites: Secondary | ICD-10-CM | POA: Diagnosis not present

## 2022-02-21 HISTORY — PX: IR PARACENTESIS: IMG2679

## 2022-02-21 LAB — BODY FLUID CELL COUNT WITH DIFFERENTIAL
Eos, Fluid: 0 %
Lymphs, Fluid: 51 %
Monocyte-Macrophage-Serous Fluid: 41 % — ABNORMAL LOW (ref 50–90)
Neutrophil Count, Fluid: 8 % (ref 0–25)
Total Nucleated Cell Count, Fluid: 57 cu mm (ref 0–1000)

## 2022-02-21 LAB — GRAM STAIN

## 2022-02-21 MED ORDER — LIDOCAINE HCL 1 % IJ SOLN
INTRAMUSCULAR | Status: AC
  Administered 2022-02-21: 10 mL
  Filled 2022-02-21: qty 20

## 2022-02-21 MED ORDER — ALBUMIN HUMAN 25 % IV SOLN
INTRAVENOUS | Status: AC
  Filled 2022-02-21: qty 200

## 2022-02-21 MED ORDER — ALBUMIN HUMAN 25 % IV SOLN
50.0000 g | Freq: Once | INTRAVENOUS | Status: AC
  Administered 2022-02-21: 50 g via INTRAVENOUS

## 2022-02-21 NOTE — Procedures (Signed)
PROCEDURE SUMMARY:  Successful ultrasound guided paracentesis from the left  lower quadrant.  Yielded 7 of straw colored fluid.  No immediate complications.  The patient tolerated the procedure well.   Specimen was sent for labs.  EBL < 9m  The patient has required >/=2 paracenteses in a 30 day period and a screening evaluation by the [facility] has been arranged.

## 2022-02-25 ENCOUNTER — Encounter: Payer: Medicaid Other | Admitting: Internal Medicine

## 2022-02-25 ENCOUNTER — Telehealth: Payer: Self-pay | Admitting: Internal Medicine

## 2022-02-25 ENCOUNTER — Other Ambulatory Visit (INDEPENDENT_AMBULATORY_CARE_PROVIDER_SITE_OTHER): Payer: Medicaid Other

## 2022-02-25 DIAGNOSIS — K7460 Unspecified cirrhosis of liver: Secondary | ICD-10-CM

## 2022-02-25 DIAGNOSIS — R188 Other ascites: Secondary | ICD-10-CM

## 2022-02-25 DIAGNOSIS — R601 Generalized edema: Secondary | ICD-10-CM

## 2022-02-25 LAB — BASIC METABOLIC PANEL
BUN: 16 mg/dL (ref 6–23)
CO2: 27 mEq/L (ref 19–32)
Calcium: 8.6 mg/dL (ref 8.4–10.5)
Chloride: 98 mEq/L (ref 96–112)
Creatinine, Ser: 0.81 mg/dL (ref 0.40–1.20)
GFR: 78.61 mL/min (ref >60.00)
Glucose, Bld: 133 mg/dL — ABNORMAL HIGH (ref 70–99)
Potassium: 3.4 mEq/L — ABNORMAL LOW (ref 3.5–5.1)
Sodium: 131 mEq/L — ABNORMAL LOW (ref 135–145)

## 2022-02-25 LAB — PATHOLOGIST SMEAR REVIEW

## 2022-02-25 NOTE — Telephone Encounter (Signed)
Kathy Little, This patient was scheduled for EGD today  however she had to reschedule.  Could you please send her instructions to her my chart.

## 2022-02-25 NOTE — Telephone Encounter (Signed)
Pt was contacted. Pt was notified that she can come to our lab prior to EGD.  Pt verbalized understanding with all questions answered.

## 2022-02-25 NOTE — Telephone Encounter (Signed)
Pt notified that new prep instructions was sent to pt via my chart. Pt verbalized understanding with all questions answered.

## 2022-02-25 NOTE — Telephone Encounter (Signed)
Good Afternoon Dr. Hilarie Fredrickson,  Patient came in for her procedure however he care partner left. I explained he had to be here in the lobby and wait.  She called her care partner and he told her he was not going to stay.  She rescheduled for March 26, 2022

## 2022-02-25 NOTE — Telephone Encounter (Signed)
[place], [place].  Pt rescheduled as care partner could not remain here today during her EGD JMP

## 2022-02-26 LAB — CULTURE, BODY FLUID W GRAM STAIN -BOTTLE: Culture: NO GROWTH

## 2022-03-03 ENCOUNTER — Other Ambulatory Visit: Payer: Self-pay

## 2022-03-03 DIAGNOSIS — E876 Hypokalemia: Secondary | ICD-10-CM

## 2022-03-03 MED ORDER — POTASSIUM CHLORIDE CRYS ER 20 MEQ PO TBCR: 20.0000 meq | EXTENDED_RELEASE_TABLET | Freq: Every day | ORAL | 0 refills | Status: DC

## 2022-03-07 ENCOUNTER — Telehealth: Payer: Self-pay | Admitting: Nurse Practitioner

## 2022-03-07 ENCOUNTER — Encounter: Payer: Self-pay | Admitting: *Deleted

## 2022-03-07 NOTE — Telephone Encounter (Signed)
Pt stated that she has issues getting her potassium filled at the drug store. Spoke with pharmacist who stated that they have been short staffed and that they will try and get it filled today. Pt made aware. Pt verbalized understanding with all questions answered.

## 2022-03-07 NOTE — Telephone Encounter (Signed)
Inbound call from patient stating that she has been trying to get the prescription for  Potassium that [facility] wrote for her. Patient stated the pharmacy keeps giving her a run around and she has not been able to get the medication and has not been able to have her labs done because of not having the medication. Patient is requesting a call back to discuss. Please advise.

## 2022-03-11 ENCOUNTER — Ambulatory Visit: Payer: Medicaid Other | Admitting: Nurse Practitioner

## 2022-03-14 ENCOUNTER — Other Ambulatory Visit (INDEPENDENT_AMBULATORY_CARE_PROVIDER_SITE_OTHER): Payer: Medicaid Other

## 2022-03-14 ENCOUNTER — Other Ambulatory Visit: Payer: Self-pay

## 2022-03-14 ENCOUNTER — Telehealth: Payer: Self-pay | Admitting: Nurse Practitioner

## 2022-03-14 DIAGNOSIS — R188 Other ascites: Secondary | ICD-10-CM | POA: Diagnosis not present

## 2022-03-14 DIAGNOSIS — E876 Hypokalemia: Secondary | ICD-10-CM

## 2022-03-14 DIAGNOSIS — R601 Generalized edema: Secondary | ICD-10-CM

## 2022-03-14 DIAGNOSIS — K7460 Unspecified cirrhosis of liver: Secondary | ICD-10-CM

## 2022-03-14 LAB — CBC WITH DIFFERENTIAL/PLATELET
Basophils Absolute: 0 10*3/uL (ref 0.0–0.1)
Basophils Relative: 0.9 % (ref 0.0–3.0)
Eosinophils Absolute: 0.1 10*3/uL (ref 0.0–0.7)
Eosinophils Relative: 2.1 % (ref 0.0–5.0)
HCT: 40.3 % (ref 36.0–46.0)
Hemoglobin: 13.6 g/dL (ref 12.0–15.0)
Lymphocytes Relative: 13.4 % (ref 12.0–46.0)
Lymphs Abs: 0.4 10*3/uL — ABNORMAL LOW (ref 0.7–4.0)
MCHC: 33.9 g/dL (ref 30.0–36.0)
MCV: 99 fl (ref 78.0–100.0)
Monocytes Absolute: 0.3 10*3/uL (ref 0.1–1.0)
Monocytes Relative: 11.4 % (ref 3.0–12.0)
Neutro Abs: 2.1 10*3/uL (ref 1.4–7.7)
Neutrophils Relative %: 72.2 % (ref 43.0–77.0)
Platelets: 112 10*3/uL — ABNORMAL LOW (ref 150.0–400.0)
RBC: 4.07 Mil/uL (ref 3.87–5.11)
RDW: 16.7 % — ABNORMAL HIGH (ref 11.5–15.5)
WBC: 2.9 10*3/uL — ABNORMAL LOW (ref 4.0–10.5)

## 2022-03-14 LAB — HEPATIC FUNCTION PANEL
ALT: 48 U/L — ABNORMAL HIGH (ref 0–35)
AST: 95 U/L — ABNORMAL HIGH (ref 0–37)
Albumin: 2.5 g/dL — ABNORMAL LOW (ref 3.5–5.2)
Alkaline Phosphatase: 222 U/L — ABNORMAL HIGH (ref 39–117)
Bilirubin, Direct: 1.5 mg/dL — ABNORMAL HIGH (ref 0.0–0.3)
Total Bilirubin: 2.8 mg/dL — ABNORMAL HIGH (ref 0.2–1.2)
Total Protein: 6.7 g/dL (ref 6.0–8.3)

## 2022-03-14 LAB — PROTIME-INR
INR: 1.4 ratio — ABNORMAL HIGH (ref 0.8–1.0)
Prothrombin Time: 15.4 s — ABNORMAL HIGH (ref 9.6–13.1)

## 2022-03-14 LAB — BASIC METABOLIC PANEL
BUN: 12 mg/dL (ref 6–23)
CO2: 27 mEq/L (ref 19–32)
Calcium: 8.4 mg/dL (ref 8.4–10.5)
Chloride: 104 mEq/L (ref 96–112)
Creatinine, Ser: 0.69 mg/dL (ref 0.40–1.20)
GFR: 93.95 mL/min (ref >60.00)
Glucose, Bld: 78 mg/dL (ref 70–99)
Potassium: 3.7 mEq/L (ref 3.5–5.1)
Sodium: 135 mEq/L (ref 135–145)

## 2022-03-14 NOTE — Telephone Encounter (Signed)
Pt made aware of Carl Best NP recommendations. Orders for labs placed in EPIC.  Pt stated that she was going to try her best to try and get a ride to the lab today. Pt was given the importance of coming to lab today to ensure her labs are stable prior to procedure. Pt verbalized understanding with all questions answered.

## 2022-03-14 NOTE — Telephone Encounter (Signed)
Kathy Little, patient was due to have a BMP done on 03/07/2022 which was not done. She is a newly diagnosed cirrhotic patient on high dose diuretics in setting of intermittent hypokalemia. Pls contact patient to have a BMP, hepatic panel, CBC and PT/INR done today. She is scheduled for an EGD with Dr .Hilarie Fredrickson on Monday 03/17/2022. Thanks

## 2022-03-17 ENCOUNTER — Ambulatory Visit (AMBULATORY_SURGERY_CENTER): Payer: Medicaid Other | Admitting: Internal Medicine

## 2022-03-17 ENCOUNTER — Encounter: Payer: Self-pay | Admitting: Internal Medicine

## 2022-03-17 VITALS — BP 112/62 | HR 84 | Temp 97.1°F | Resp 20 | Ht 67.0 in | Wt 156.0 lb

## 2022-03-17 DIAGNOSIS — K7460 Unspecified cirrhosis of liver: Secondary | ICD-10-CM

## 2022-03-17 DIAGNOSIS — R188 Other ascites: Secondary | ICD-10-CM

## 2022-03-17 DIAGNOSIS — I8500 Esophageal varices without bleeding: Secondary | ICD-10-CM

## 2022-03-17 DIAGNOSIS — K766 Portal hypertension: Secondary | ICD-10-CM | POA: Diagnosis not present

## 2022-03-17 DIAGNOSIS — B182 Chronic viral hepatitis C: Secondary | ICD-10-CM

## 2022-03-17 MED ORDER — SODIUM CHLORIDE 0.9 % IV SOLN: 500.0000 mL | Freq: Once | INTRAVENOUS | Status: DC

## 2022-03-17 NOTE — Progress Notes (Signed)
GASTROENTEROLOGY PROCEDURE H&P NOTE   Primary Care Physician: Noralyn Pick, NP    Reason for Procedure:  Variceal screening  Plan:    Upper endoscopy  Patient is appropriate for endoscopic procedure(s) in the ambulatory ([place]) setting.  The nature of the procedure, as well as the risks, benefits, and alternatives were carefully and thoroughly reviewed with the patient. Ample time for discussion and questions allowed. The patient understood, was satisfied, and agreed to proceed.     HPI: Kathy Little is a 62 y.o. female who presents for EGD.  Medical history as below.   No recent chest pain or shortness of breath.  No abdominal pain today.  Past Medical History:  Diagnosis Date   Atherosclerosis    Cirrhosis ([place])    Hepatitis C 1997   Hepatitis C    Hiatal hernia    History of brachytherapy 11/28/14, 12/05/14. 12/12/14   proximal vagina 24 gray   Radiation 10/10/2014 through 11/07/201610/04/2014 through 11/13/2014   Pelvis 45 gray    Vaginal cancer [facility])     Past Surgical History:  Procedure Laterality Date   COLPOSCOPY  08/30/14   and vaginal biopsy   IR PARACENTESIS  01/22/2022   IR PARACENTESIS  02/21/2022   ROBOTIC ASSISTED LAPAROSCOPIC HYSTERECTOMY AND SALPINGECTOMY  2012   Dr. Loni Muse, [place]    Prior to Admission medications   Medication Sig Start Date End Date Taking? Authorizing Provider  furosemide (LASIX) 40 MG tablet Take 2 tablets (80 mg total) by mouth daily. 02/19/22  Yes Noralyn Pick, NP  omeprazole (PRILOSEC) 40 MG capsule Take 1 capsule (40 mg total) by mouth daily. 01/17/22  Yes Noralyn Pick, NP  potassium chloride SA (KLOR-CON M) 20 MEQ tablet Take 1 tablet (20 mEq total) by mouth daily. 03/03/22 04/02/22 Yes Noralyn Pick, NP  spironolactone (ALDACTONE) 100 MG tablet Take 2 tablets (200 mg total) by mouth daily. 02/19/22  Yes Noralyn Pick, NP    Current Outpatient Medications  Medication Sig  Dispense Refill   furosemide (LASIX) 40 MG tablet Take 2 tablets (80 mg total) by mouth daily. 60 tablet 0   omeprazole (PRILOSEC) 40 MG capsule Take 1 capsule (40 mg total) by mouth daily. 30 capsule 1   potassium chloride SA (KLOR-CON M) 20 MEQ tablet Take 1 tablet (20 mEq total) by mouth daily. 30 tablet 0   spironolactone (ALDACTONE) 100 MG tablet Take 2 tablets (200 mg total) by mouth daily. 60 tablet 0   Current Facility-Administered Medications  Medication Dose Route Frequency Provider Last Rate Last Admin   0.9 %  sodium chloride infusion  500 mL Intravenous Once Haydee Jabbour, Lajuan Lines, MD        Allergies as of 03/17/2022 - Review Complete 03/17/2022  Allergen Reaction Noted   Amoxicillin  12/16/2021   Flagyl [metronidazole] Nausea Only 09/13/2014    Family History  Problem Relation Age of Onset   Colon cancer Mother    Heart attack Father    Pancreatic cancer Maternal Grandfather    Rectal cancer Neg Hx    Stomach cancer Neg Hx    Esophageal cancer Neg Hx     Social History   Socioeconomic History   Marital status: Single    Spouse name: Not on file   Number of children: 3   Years of education: Not on file   Highest education level: Not on file  Occupational History   Not on file  Tobacco Use   Smoking  status: Former    Packs/day: 0.25    Types: Cigarettes    Quit date: 08/28/2014    Years since quitting: 7.5   Smokeless tobacco: Never  Vaping Use   Vaping Use: Never used  Substance and Sexual Activity   Alcohol use: Yes    Alcohol/week: 0.0 standard drinks of alcohol    Comment: occasional beer   Drug use: No   Sexual activity: Yes    Birth control/protection: Condom  Other Topics Concern   Not on file  Social History Narrative   Not on file   Social Determinants of Health   Financial Resource Strain: Not on file  Food Insecurity: Not on file  Transportation Needs: Not on file  Physical Activity: Not on file  Stress: Not on file  Social Connections:  Not on file  Intimate Partner Violence: Not on file    Physical Exam: Vital signs in last 24 hours: '@BP'$  124/79 (BP Location: Right Arm, Patient Position: Sitting, Cuff Size: Normal)   Pulse 91   Temp (!) 97.1 F (36.2 C) (Temporal)   Ht '5\' 7"'$  (1.702 m)   Wt 156 lb (70.8 kg)   SpO2 99%   BMI 24.43 kg/m  GEN: NAD EYE: Sclerae anicteric ENT: MMM CV: Non-tachycardic Pulm: CTA b/l GI: Soft, NT/ND NEURO:  Alert & Oriented x 3   Kathy Jarred, MD [facility]  03/17/2022 2:02 PM

## 2022-03-17 NOTE — Op Note (Signed)
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 The patient's tolerance of                            previous anesthesia was also reviewed. The risks                            and benefits of the procedure and the sedation                            options and risks were discussed with the patient.                            All questions were answered, and informed consent                            was obtained. Prior Anticoagulants: The patient has                            taken no anticoagulant or antiplatelet agents. ASA                            Grade Assessment: III - A patient with severe                            systemic disease. After reviewing the risks and                            benefits, the patient was deemed in satisfactory                            condition to undergo the procedure.  After obtaining informed consent, the endoscope was                            passed under direct vision. Throughout the                            procedure, the patient's blood pressure, pulse, and                            oxygen saturations were monitored continuously. The                            GIF [id number] was introduced through the                            mouth, and advanced  to the second part of duodenum.                            The upper GI endoscopy was accomplished without                            difficulty. The patient tolerated the procedure                            well. Scope In: Scope Out: Findings:                 Grade III, large (> 5 mm) varices were found in the                            lower third of the esophagus.                           Moderate portal hypertensive gastropathy was found                            in the cardia, in the gastric fundus and in the                            gastric body.                           The examined duodenum was normal. Complications:            No immediate complications. Estimated Blood Loss:     Estimated blood loss: none. Impression:               - Grade III and large (> 5 mm) esophageal varices.                           - Portal hypertensive gastropathy.                           - No gastric varices seen.                           -  Normal examined duodenum.                           - No specimens collected. Recommendation:           - Patient has a contact number available for                            emergencies. The signs and symptoms of potential                            delayed complications were discussed with the                            patient. Return to normal activities tomorrow.                            Written discharge instructions were provided to the                            patient.                           - Resume previous diet.                           - Continue present medications.                           - Given size of varices consideration of EVL versus                            early TIPS is recommended. TIPS may be preferred                            given recurrent ascites as this would manage both                            problems.                           - Schedule ECHO for pre-TIPS evaluation.                           - IR  referral to consider TIPS. If IR does not feel                            she is a TIPS candidate repeat EGD in the                            outpatient hospital setting is recommended for                            variceal band ligation.                           -  She will also be seen [facility] next                            month.                           - In the interim repeat LVP can be done PRN with IV                            albumin. Jerene Bears, MD 03/17/2022 2:26:46 PM This report has been signed electronically. CC Letter to:             Roosevelt Locks, NP, Noralyn Pick

## 2022-03-17 NOTE — Progress Notes (Signed)
Sedate, gd SR's, VSS, report to RN 

## 2022-03-17 NOTE — Patient Instructions (Addendum)
- Resume previous diet.                           - Continue present medications.                           - Given size of varices consideration of EVL versus                            early TIPS is recommended. TIPS may be preferred                            given recurrent ascites as this would manage both                            problems.                           - Schedule ECHO for pre-TIPS evaluation.                           - IR referral to consider TIPS. If IR does not feel                            she is a TIPS candidate repeat EGD in the                            outpatient hospital setting is recommended for                            variceal band ligation.                           - She will also be seen [facility] next                            month.                           - In the interim repeat LVP can be done PRN with IV                            albumin.  YOU HAD AN ENDOSCOPIC PROCEDURE TODAY AT [facility]:   Refer to the procedure report that was given to you for any specific questions about what was found during the examination.  If the procedure report does not answer your questions, please call your gastroenterologist to clarify.  If you requested that your care partner not be given the details of your procedure findings, then the procedure report has been included in a sealed envelope for you to review at your convenience later.  YOU SHOULD EXPECT: Some feelings of bloating in the abdomen. Passage of more gas than usual.  Walking can help get rid of the air that was put into your GI tract during the procedure and reduce the bloating. If you had a lower endoscopy (such as  a colonoscopy or flexible sigmoidoscopy) you may notice spotting of blood in your stool or on the toilet paper. If you underwent a bowel prep for your procedure, you may not have a normal bowel movement for a few days.  Please Note:  You  might notice some irritation and congestion in your nose or some drainage.  This is from the oxygen used during your procedure.  There is no need for concern and it should clear up in a day or so.  SYMPTOMS TO REPORT IMMEDIATELY:   Following upper endoscopy (EGD)  Vomiting of blood or coffee ground material  New chest pain or pain under the shoulder blades  Painful or persistently difficult swallowing  New shortness of breath  Fever of 100F or higher  Black, tarry-looking stools  For urgent or emergent issues, a gastroenterologist can be reached at any hour by calling [phone]. Do not use MyChart messaging for urgent concerns.    DIET:  We do recommend a small meal at first, but then you may proceed to your regular diet.  Drink plenty of fluids but you should avoid alcoholic beverages for 24 hours.  ACTIVITY:  You should plan to take it easy for the rest of today and you should NOT DRIVE or use heavy machinery until tomorrow (because of the sedation medicines used during the test).    FOLLOW UP: Our staff will call the number listed on your records the next business day following your procedure.  We will call around 7:15- 8:00 am to check on you and address any questions or concerns that you may have regarding the information given to you following your procedure. If we do not reach you, we will leave a message.     If any biopsies were taken you will be contacted by phone or by letter within the next 1-3 weeks.  Please call us at [phone] if you have not heard about the biopsies in 3 weeks.    SIGNATURES/CONFIDENTIALITY: You and/or your care partner have signed paperwork which will be entered into your electronic medical record.  These signatures attest to the fact that that the information above on your After Visit Summary has been reviewed and is understood.  Full responsibility of the confidentiality of this discharge information lies with you and/or your care-partner.

## 2022-03-18 ENCOUNTER — Telehealth: Payer: Self-pay

## 2022-03-18 NOTE — Telephone Encounter (Signed)
  Follow up Call-     03/17/2022    1:25 PM 03/17/2022    1:12 PM  Call back number  Post procedure Call Back phone  # [phone]   Permission to leave phone message  Yes     Patient questions:  Do you have a fever, pain , or abdominal swelling? No. Pain Score  0 *  Have you tolerated food without any problems? Yes.    Have you been able to return to your normal activities? Yes.    Do you have any questions about your discharge instructions: Diet   No. Medications  No. Follow up visit  No.  Do you have questions or concerns about your Care? No.  Actions: * If pain score is 4 or above: No action needed, pain <4.

## 2022-03-19 ENCOUNTER — Emergency Department (HOSPITAL_COMMUNITY): Payer: Medicaid Other

## 2022-03-19 ENCOUNTER — Encounter (HOSPITAL_COMMUNITY): Payer: Self-pay

## 2022-03-19 ENCOUNTER — Inpatient Hospital Stay (HOSPITAL_COMMUNITY)
Admission: EM | Admit: 2022-03-19 | Discharge: 2022-03-25 | DRG: 432 | Disposition: A | Discharge status: Home / Self Care | Payer: Medicaid Other | Attending: Internal Medicine | Admitting: Internal Medicine

## 2022-03-19 ENCOUNTER — Telehealth: Payer: Self-pay | Admitting: Internal Medicine

## 2022-03-19 DIAGNOSIS — R202 Paresthesia of skin: Secondary | ICD-10-CM | POA: Diagnosis not present

## 2022-03-19 DIAGNOSIS — Z90710 Acquired absence of both cervix and uterus: Secondary | ICD-10-CM

## 2022-03-19 DIAGNOSIS — D6959 Other secondary thrombocytopenia: Secondary | ICD-10-CM | POA: Diagnosis present

## 2022-03-19 DIAGNOSIS — D72819 Decreased white blood cell count, unspecified: Secondary | ICD-10-CM | POA: Diagnosis present

## 2022-03-19 DIAGNOSIS — K7460 Unspecified cirrhosis of liver: Secondary | ICD-10-CM | POA: Diagnosis not present

## 2022-03-19 DIAGNOSIS — K2289 Other specified disease of esophagus: Secondary | ICD-10-CM | POA: Diagnosis present

## 2022-03-19 DIAGNOSIS — E871 Hypo-osmolality and hyponatremia: Secondary | ICD-10-CM | POA: Diagnosis present

## 2022-03-19 DIAGNOSIS — I2510 Atherosclerotic heart disease of native coronary artery without angina pectoris: Secondary | ICD-10-CM | POA: Diagnosis present

## 2022-03-19 DIAGNOSIS — E872 Acidosis, unspecified: Secondary | ICD-10-CM | POA: Diagnosis present

## 2022-03-19 DIAGNOSIS — K3189 Other diseases of stomach and duodenum: Secondary | ICD-10-CM | POA: Diagnosis present

## 2022-03-19 DIAGNOSIS — K766 Portal hypertension: Secondary | ICD-10-CM | POA: Diagnosis present

## 2022-03-19 DIAGNOSIS — K7290 Hepatic failure, unspecified without coma: Secondary | ICD-10-CM | POA: Diagnosis not present

## 2022-03-19 DIAGNOSIS — K922 Gastrointestinal hemorrhage, unspecified: Principal | ICD-10-CM | POA: Diagnosis present

## 2022-03-19 DIAGNOSIS — I8511 Secondary esophageal varices with bleeding: Secondary | ICD-10-CM | POA: Diagnosis present

## 2022-03-19 DIAGNOSIS — B182 Chronic viral hepatitis C: Secondary | ICD-10-CM | POA: Diagnosis present

## 2022-03-19 DIAGNOSIS — Z923 Personal history of irradiation: Secondary | ICD-10-CM

## 2022-03-19 DIAGNOSIS — D62 Acute posthemorrhagic anemia: Secondary | ICD-10-CM | POA: Diagnosis present

## 2022-03-19 DIAGNOSIS — I8501 Esophageal varices with bleeding: Secondary | ICD-10-CM

## 2022-03-19 DIAGNOSIS — R161 Splenomegaly, not elsewhere classified: Secondary | ICD-10-CM | POA: Diagnosis present

## 2022-03-19 DIAGNOSIS — Z8544 Personal history of malignant neoplasm of other female genital organs: Secondary | ICD-10-CM

## 2022-03-19 DIAGNOSIS — B1920 Unspecified viral hepatitis C without hepatic coma: Secondary | ICD-10-CM | POA: Diagnosis not present

## 2022-03-19 DIAGNOSIS — Z8541 Personal history of malignant neoplasm of cervix uteri: Secondary | ICD-10-CM

## 2022-03-19 DIAGNOSIS — D689 Coagulation defect, unspecified: Secondary | ICD-10-CM | POA: Diagnosis present

## 2022-03-19 DIAGNOSIS — K921 Melena: Secondary | ICD-10-CM | POA: Diagnosis not present

## 2022-03-19 DIAGNOSIS — K7682 Hepatic encephalopathy: Secondary | ICD-10-CM | POA: Diagnosis not present

## 2022-03-19 DIAGNOSIS — K920 Hematemesis: Secondary | ICD-10-CM | POA: Diagnosis present

## 2022-03-19 DIAGNOSIS — B181 Chronic viral hepatitis B without delta-agent: Secondary | ICD-10-CM | POA: Diagnosis present

## 2022-03-19 DIAGNOSIS — Z1152 Encounter for screening for COVID-19: Secondary | ICD-10-CM

## 2022-03-19 DIAGNOSIS — K7469 Other cirrhosis of liver: Secondary | ICD-10-CM | POA: Diagnosis present

## 2022-03-19 DIAGNOSIS — R9431 Abnormal electrocardiogram [ECG] [EKG]: Secondary | ICD-10-CM | POA: Diagnosis not present

## 2022-03-19 DIAGNOSIS — R188 Other ascites: Secondary | ICD-10-CM | POA: Diagnosis present

## 2022-03-19 DIAGNOSIS — K449 Diaphragmatic hernia without obstruction or gangrene: Secondary | ICD-10-CM | POA: Diagnosis present

## 2022-03-19 DIAGNOSIS — Z79899 Other long term (current) drug therapy: Secondary | ICD-10-CM

## 2022-03-19 DIAGNOSIS — K652 Spontaneous bacterial peritonitis: Secondary | ICD-10-CM | POA: Diagnosis present

## 2022-03-19 DIAGNOSIS — I959 Hypotension, unspecified: Secondary | ICD-10-CM | POA: Diagnosis present

## 2022-03-19 DIAGNOSIS — Z881 Allergy status to other antibiotic agents status: Secondary | ICD-10-CM

## 2022-03-19 DIAGNOSIS — Z800 Family history of malignant neoplasm of digestive organs: Secondary | ICD-10-CM

## 2022-03-19 DIAGNOSIS — R7989 Other specified abnormal findings of blood chemistry: Secondary | ICD-10-CM

## 2022-03-19 DIAGNOSIS — Z87891 Personal history of nicotine dependence: Secondary | ICD-10-CM | POA: Diagnosis not present

## 2022-03-19 DIAGNOSIS — Z8249 Family history of ischemic heart disease and other diseases of the circulatory system: Secondary | ICD-10-CM

## 2022-03-19 DIAGNOSIS — Z880 Allergy status to penicillin: Secondary | ICD-10-CM

## 2022-03-19 LAB — CBC
HCT: 28.3 % — ABNORMAL LOW (ref 36.0–46.0)
Hemoglobin: 9.4 g/dL — ABNORMAL LOW (ref 12.0–15.0)
MCH: 35.1 pg — ABNORMAL HIGH (ref 26.0–34.0)
MCHC: 33.2 g/dL (ref 30.0–36.0)
MCV: 105.6 fL — ABNORMAL HIGH (ref 80.0–100.0)
Platelets: 121 10*3/uL — ABNORMAL LOW (ref 150–400)
RBC: 2.68 MIL/uL — ABNORMAL LOW (ref 3.87–5.11)
RDW: 17.2 % — ABNORMAL HIGH (ref 11.5–15.5)
WBC: 9.4 10*3/uL (ref 4.0–10.5)
nRBC: 0.3 % — ABNORMAL HIGH (ref 0.0–0.2)

## 2022-03-19 LAB — CBC WITH DIFFERENTIAL/PLATELET
Abs Immature Granulocytes: 0.11 10*3/uL — ABNORMAL HIGH (ref 0.00–0.07)
Basophils Absolute: 0 10*3/uL (ref 0.0–0.1)
Basophils Relative: 0 %
Eosinophils Absolute: 0 10*3/uL (ref 0.0–0.5)
Eosinophils Relative: 0 %
HCT: 31.3 % — ABNORMAL LOW (ref 36.0–46.0)
Hemoglobin: 10.3 g/dL — ABNORMAL LOW (ref 12.0–15.0)
Immature Granulocytes: 2 %
Lymphocytes Relative: 9 %
Lymphs Abs: 0.7 10*3/uL (ref 0.7–4.0)
MCH: 34.4 pg — ABNORMAL HIGH (ref 26.0–34.0)
MCHC: 32.9 g/dL (ref 30.0–36.0)
MCV: 104.7 fL — ABNORMAL HIGH (ref 80.0–100.0)
Monocytes Absolute: 0.7 10*3/uL (ref 0.1–1.0)
Monocytes Relative: 9 %
Neutro Abs: 5.6 10*3/uL (ref 1.7–7.7)
Neutrophils Relative %: 80 %
Platelets: 111 10*3/uL — ABNORMAL LOW (ref 150–400)
RBC: 2.99 MIL/uL — ABNORMAL LOW (ref 3.87–5.11)
RDW: 17.2 % — ABNORMAL HIGH (ref 11.5–15.5)
WBC: 7.1 10*3/uL (ref 4.0–10.5)
nRBC: 0 % (ref 0.0–0.2)

## 2022-03-19 LAB — I-STAT CHEM 8, ED
BUN: 25 mg/dL — ABNORMAL HIGH (ref 8–23)
Calcium, Ion: 1.08 mmol/L — ABNORMAL LOW (ref 1.15–1.40)
Chloride: 101 mmol/L (ref 98–111)
Creatinine, Ser: 0.5 mg/dL (ref 0.44–1.00)
Glucose, Bld: 121 mg/dL — ABNORMAL HIGH (ref 70–99)
HCT: 30 % — ABNORMAL LOW (ref 36.0–46.0)
Hemoglobin: 10.2 g/dL — ABNORMAL LOW (ref 12.0–15.0)
Potassium: 4.4 mmol/L (ref 3.5–5.1)
Sodium: 134 mmol/L — ABNORMAL LOW (ref 135–145)
TCO2: 22 mmol/L (ref 22–32)

## 2022-03-19 LAB — RESP PANEL BY RT-PCR (RSV, FLU A&B, COVID)  RVPGX2
Influenza A by PCR: NEGATIVE
Influenza B by PCR: NEGATIVE
Resp Syncytial Virus by PCR: NEGATIVE
SARS Coronavirus 2 by RT PCR: NEGATIVE

## 2022-03-19 LAB — COMPREHENSIVE METABOLIC PANEL
ALT: 68 U/L — ABNORMAL HIGH (ref 0–44)
AST: 168 U/L — ABNORMAL HIGH (ref 15–41)
Albumin: 2 g/dL — ABNORMAL LOW (ref 3.5–5.0)
Alkaline Phosphatase: 136 U/L — ABNORMAL HIGH (ref 38–126)
Anion gap: 10 (ref 5–15)
BUN: 19 mg/dL (ref 8–23)
CO2: 18 mmol/L — ABNORMAL LOW (ref 22–32)
Calcium: 7.9 mg/dL — ABNORMAL LOW (ref 8.9–10.3)
Chloride: 103 mmol/L (ref 98–111)
Creatinine, Ser: 0.7 mg/dL (ref 0.44–1.00)
GFR, Estimated: >60 mL/min (ref >60)
Glucose, Bld: 127 mg/dL — ABNORMAL HIGH (ref 70–99)
Potassium: 4.3 mmol/L (ref 3.5–5.1)
Sodium: 131 mmol/L — ABNORMAL LOW (ref 135–145)
Total Bilirubin: 3.7 mg/dL — ABNORMAL HIGH (ref 0.3–1.2)
Total Protein: 6 g/dL — ABNORMAL LOW (ref 6.5–8.1)

## 2022-03-19 LAB — PREPARE RBC (CROSSMATCH)

## 2022-03-19 LAB — LACTIC ACID, PLASMA
Lactic Acid, Venous: 4 mmol/L (ref 0.5–1.9)
Lactic Acid, Venous: 6.5 mmol/L (ref 0.5–1.9)

## 2022-03-19 LAB — PROTIME-INR
INR: 1.5 — ABNORMAL HIGH (ref 0.8–1.2)
Prothrombin Time: 17.9 seconds — ABNORMAL HIGH (ref 11.4–15.2)

## 2022-03-19 LAB — LIPASE, BLOOD: Lipase: 69 U/L — ABNORMAL HIGH (ref 11–51)

## 2022-03-19 LAB — ABO/RH: ABO/RH(D): A POS

## 2022-03-19 LAB — BRAIN NATRIURETIC PEPTIDE: B Natriuretic Peptide: 31.4 pg/mL (ref 0.0–100.0)

## 2022-03-19 LAB — APTT: aPTT: 34 seconds (ref 24–36)

## 2022-03-19 MED ORDER — PANTOPRAZOLE INFUSION (NEW) - SIMPLE MED
8.0000 mg/h | INTRAVENOUS | Status: AC
  Administered 2022-03-19 – 2022-03-22 (×7): 8 mg/h via INTRAVENOUS
  Filled 2022-03-19 (×8): qty 100

## 2022-03-19 MED ORDER — SODIUM CHLORIDE 0.9 % IV SOLN
50.0000 ug/h | INTRAVENOUS | Status: DC
  Administered 2022-03-19 – 2022-03-24 (×11): 50 ug/h via INTRAVENOUS
  Filled 2022-03-19 (×12): qty 1

## 2022-03-19 MED ORDER — SODIUM CHLORIDE 0.9% IV SOLUTION: Freq: Once | INTRAVENOUS | Status: AC

## 2022-03-19 MED ORDER — VITAMIN K1 10 MG/ML IJ SOLN
10.0000 mg | Freq: Once | INTRAVENOUS | Status: AC
  Administered 2022-03-19: 10 mg via INTRAVENOUS
  Filled 2022-03-19: qty 1

## 2022-03-19 MED ORDER — ONDANSETRON HCL 4 MG/2ML IJ SOLN
4.0000 mg | Freq: Once | INTRAMUSCULAR | Status: AC
  Administered 2022-03-19: 4 mg via INTRAVENOUS
  Filled 2022-03-19: qty 2

## 2022-03-19 MED ORDER — PANTOPRAZOLE SODIUM 40 MG IV SOLR: 40.0000 mg | Freq: Two times a day (BID) | INTRAVENOUS | Status: DC

## 2022-03-19 MED ORDER — PANTOPRAZOLE SODIUM 40 MG IV SOLR
40.0000 mg | Freq: Once | INTRAVENOUS | Status: AC
  Administered 2022-03-19: 40 mg via INTRAVENOUS
  Filled 2022-03-19: qty 10

## 2022-03-19 MED ORDER — ALBUMIN HUMAN 5 % IV SOLN
12.5000 g | Freq: Once | INTRAVENOUS | Status: AC
  Administered 2022-03-19: 12.5 g via INTRAVENOUS
  Filled 2022-03-19: qty 250

## 2022-03-19 MED ORDER — METOCLOPRAMIDE HCL 5 MG/ML IJ SOLN
10.0000 mg | Freq: Once | INTRAMUSCULAR | Status: AC
  Administered 2022-03-19: 10 mg via INTRAVENOUS
  Filled 2022-03-19: qty 2

## 2022-03-19 MED ORDER — SODIUM CHLORIDE 0.9% IV SOLUTION: Freq: Once | INTRAVENOUS | Status: DC

## 2022-03-19 MED ORDER — OCTREOTIDE LOAD VIA INFUSION
50.0000 ug | Freq: Once | INTRAVENOUS | Status: AC
  Administered 2022-03-19: 50 ug via INTRAVENOUS
  Filled 2022-03-19: qty 25

## 2022-03-19 MED ORDER — ONDANSETRON HCL 4 MG PO TABS: 4.0000 mg | ORAL_TABLET | Freq: Four times a day (QID) | ORAL | Status: DC | PRN

## 2022-03-19 MED ORDER — SODIUM CHLORIDE 0.9 % IV SOLN
1.0000 g | Freq: Once | INTRAVENOUS | Status: AC
  Administered 2022-03-19: 1 g via INTRAVENOUS
  Filled 2022-03-19: qty 10

## 2022-03-19 MED ORDER — ALBUMIN HUMAN 25 % IV SOLN: 25.0000 g | Freq: Once | INTRAVENOUS | Status: DC

## 2022-03-19 MED ORDER — SODIUM CHLORIDE 0.9 % IV SOLN
1.0000 g | INTRAVENOUS | Status: DC
  Administered 2022-03-20 – 2022-03-24 (×5): 1 g via INTRAVENOUS
  Filled 2022-03-19 (×6): qty 10

## 2022-03-19 MED ORDER — ONDANSETRON HCL 4 MG/2ML IJ SOLN
4.0000 mg | Freq: Four times a day (QID) | INTRAMUSCULAR | Status: DC | PRN
  Administered 2022-03-20: 4 mg via INTRAVENOUS
  Filled 2022-03-19: qty 2

## 2022-03-19 MED ORDER — MIDODRINE HCL 5 MG PO TABS
15.0000 mg | ORAL_TABLET | Freq: Three times a day (TID) | ORAL | Status: DC
  Administered 2022-03-19 – 2022-03-25 (×18): 15 mg via ORAL
  Filled 2022-03-19 (×18): qty 3

## 2022-03-19 NOTE — ED Provider Notes (Signed)
  Physical Exam  BP 130/88 (BP Location: Left Arm)   Pulse (!) 56   Temp 97.6 F (36.4 C)   Resp 18   SpO2 99%   Physical Exam  Procedures  .Critical Care  Performed by: Cristie Hem, MD Authorized by: Cristie Hem, MD   Critical care provider statement:    Critical care time (minutes):  30   Critical care was necessary to treat or prevent imminent or life-threatening deterioration of the following conditions: GI bleeding.   Critical care was time spent personally by me on the following activities:  Development of treatment plan with patient or surrogate, discussions with consultants, evaluation of patient's response to treatment, examination of patient, ordering and review of laboratory studies, ordering and review of radiographic studies, ordering and performing treatments and interventions, pulse oximetry, re-evaluation of patient's condition and review of old charts   ED Course / MDM   Clinical Course as of 03/19/22 1812  Wed Mar 19, 2022  1622 Received sign out from Dr. Mayra Neer. Presenting with hematemesis. Had an endoscopy 3 days ago showing varices.  [WS]  6767 Discussed with IM teaching service who will admit. Discussed with [facility] who will see patient.  [WS]  2094 Lactic Acid, Venous(!!): 4.0 Likely mainly type B due to cirrhosis. GI also recommends 10mg  IV vitamin K and PPI infusion which has been ordered.GI will evaluate for endoscopy [WS]    Clinical Course User Index [WS] Cristie Hem, MD   Medical Decision Making Amount and/or Complexity of Data Reviewed Labs: ordered. Decision-making details documented in ED Course. Radiology: ordered.  Risk Prescription drug management. Decision regarding hospitalization.         Cristie Hem, MD 03/19/22 [phone]

## 2022-03-19 NOTE — Telephone Encounter (Signed)
The pt had EGD on 3/11 and states that last night she began to vomit dark red blood and pass BRBPR.  She states she has diarrhea and very bloated.  She says she can not move very well due to the abd bloating.  She says she does not feel well at all.  Dr Hilarie Fredrickson do you want the pt to go to the ED for eval.

## 2022-03-19 NOTE — Progress Notes (Signed)
This patient has been evaluated briefly. Full GI consult note to be had tomorrow. She has evidence of advanced cirrhosis with portal hypertension and G3 Evs noted recently. Presents with hematemesis overnight into today. The patient is hemodynamically stable currently but has risk of issues developing overnight. She has been initiated on Octreotide and on Protonix and on Ceftriaxone.   She will need an updated TTE to evaluate for heart issues as she could require TIPS procedure. If overnight, she has significant decompensation, please alert the [place], who can evaluate for possible EV banding via EGD overnight in the ICU. If stable, she will be placed on the list for EGD tomorrow AM. Will want to have our Medicine service please call IR to ask to assess, as she may be a candidate of upfront/early TIPS in the setting of her portal hypertension and her ascites. If able to perform a diagnostic paracentesis that will ensure no SBP, but she will be covered with antibiotic therapy in the interim. I have updated my partner overnight. Please call with questions. Will discuss with primary GI Dr. Hilarie Fredrickson as well.   Justice Britain, MD [facility] # [phone]

## 2022-03-19 NOTE — Telephone Encounter (Signed)
Inbound call from patient stating that she had a colonoscopy on 3/11 and last night she started vomiting blood and is now passing blood in her stool. Patient is requesting a call back to discuss. Please advise.

## 2022-03-19 NOTE — ED Triage Notes (Addendum)
Pt bib ems from home; pt had endoscopic procedure x 2 days ago; last night began vomiting bright red blood, having bloody diarrhea; this am vomiting clear fluid; abdomen distended, painful; 126/90, P 110, 97% RA, cbg 152; hx cirrhosis, paracentesis

## 2022-03-19 NOTE — H&P (Signed)
Date: 03/19/2022               Patient Name:  Kathy Little MRN: [id number]  DOB: 01-28-1960 Age / Sex: 62 y.o., female   PCP: Noralyn Pick, NP         Medical Service: Internal Medicine Teaching Service         Attending Physician: Dr. Velna Ochs, MD    First Contact: Dr. Iona Coach, MD Pager: [phone]  Second Contact: Dr. Gaylan Gerold, DO Pager: [phone]       After Hours (After 5p/  First Contact Pager: [phone]  weekends / holidays): Second Contact Pager: [phone]   Chief Complaint: hematemesis  History of Present Illness: Kathy Little is a 62 y/o female with a pmh of cirrhosis 2/2 to untreated HCV, esophageal varices who presents with 1 day of hematemesis. Last night she reports dark red emesis with some bright red that eventually turned brown.This morning, she had clear emesis and last vomited around 4PM in the ED with no evidence of blood. Last emesis 2 hour ago. Today she developed multiple dark red/brown diarrhea that she said showed bright blood when she flushed. She said the diarrhea was similar to water in consistency.Denies prior episodes of upper or lower GI bleed. Denies lightheadedness, dizziness, vision changes, sob. Mentioned some resolved CP, when asked about it points to the epigastric region. She says this happens when her abdomen swells. Says she has had two paracenteses this year. Her dry weight is around 139lbs, says she recently weighed 159lbs at home. Her belly is as distended as it was last time the ascitic fluid was drained.   Meds: Current Outpatient Medications  Medication Instructions   furosemide (LASIX) 80 mg, Oral, Daily   omeprazole (PRILOSEC) 40 mg, Oral, Daily   potassium chloride SA (KLOR-CON M) 20 MEQ tablet 20 mEq, Oral, Daily   spironolactone (ALDACTONE) 200 mg, Oral, Daily      Allergies: Allergies as of 03/19/2022 - Review Complete 03/19/2022  Allergen Reaction Noted   Amoxicillin  12/16/2021   Flagyl  [metronidazole] Nausea Only 09/13/2014   Past Medical History:  Diagnosis Date   Atherosclerosis    Cirrhosis ([place])    Hepatitis C 1997   Hepatitis C    Hiatal hernia    History of brachytherapy 11/28/14, 12/05/14. 12/12/14   proximal vagina 24 gray   Radiation 10/10/2014 through 11/07/201610/04/2014 through 11/13/2014   Pelvis 45 gray    Vaginal cancer ([place])     Family History:  Family History  Problem Relation Age of Onset   Colon cancer Mother    Heart attack Father    Pancreatic cancer Maternal Grandfather    Rectal cancer Neg Hx    Stomach cancer Neg Hx    Esophageal cancer Neg Hx      Social History:  Social History   Socioeconomic History   Marital status: Single    Spouse name: Not on file   Number of children: 3   Years of education: Not on file   Highest education level: Not on file  Occupational History   Not on file  Tobacco Use   Smoking status: Former    Packs/day: 0.25    Types: Cigarettes    Quit date: 08/28/2014    Years since quitting: 7.5   Smokeless tobacco: Never  Vaping Use   Vaping Use: Never used  Substance and Sexual Activity   Alcohol use: Yes    Alcohol/week: 0.0 standard drinks of  alcohol    Comment: occasional beer   Drug use: No   Sexual activity: Yes    Birth control/protection: Condom  Other Topics Concern   Not on file  Social History Narrative   Not on file   Social Determinants of Health   Financial Resource Strain: Not on file  Food Insecurity: Not on file  Transportation Needs: Not on file  Physical Activity: Not on file  Stress: Not on file  Social Connections: Not on file  Intimate Partner Violence: Not on file   Lives w her partner of 34 years,daughter and son.Independent w ADLs. Works at [employer].No smoking, alcohol, drugs. Does not have a pcp currently.  Review of Systems: A complete ROS was negative except as per HPI.   Physical Exam: Blood pressure 130/88, pulse (!) 56, temperature 97.6 F (36.4 C),  resp. rate 18, SpO2 99 %. [id number] ill appearing, no acute distress, laying in bed cooperatively HEENT:scleral icterus Pulm:normal wob, diminished lung sounds at the R base with crackles otherwise clear to auscultation [id number], regular rhythm, no m/r/g, 2+ bilateral radial and DP pulses Abd/GI:soft, distended, no ptp. Diffuse dullness to percussion. No blood seen on anal exam, no prolapsing hemorrhoids, presence of skin tag. Stool at bedside black and watery. Extremities: warm, bilateral LE edema Neuro: alert and oriented, mild asterixis   CXR: personally reviewed my interpretation is no acute cardiopulmonary process, potential atelectasis at the RLL  Assessment & Plan by Problem: Active Problems:   * No active hospital problems. *  Kathy Little is a 62 y/o female with a pmh of cirrhosis 2/2 to untreated HCV, esophageal varices who presents with 1 day of hematemesis.  Decompensated Cirrhosis 2/2 HCV Esophageal varices UGIB c/b acute blood loss anemia  Presenting with coffee ground emesis and bright blood per rectum.Follows with Dr Hilarie Fredrickson for cirrhosis. EGD 3/11 with grade 3 >76m esophageal varices.HDS, tachycardic to 120. Hemoglobin to 10.3 from 13.6 5 days ago, baseline around 13-14. Suspect brisk variceal bleed as the cause of UGIB and hematochezia. On anal exam no hemorrhoids and no evidence of bleeding.MELD-NA 21 today. Decreased synthetic function with albumin to 2.0 and platelets to 111.Dr. PHilarie Fredricksonrecommended early TIPS given varices and decompensated cirrhosis with recurrent ascites. Needs echo prior to TIPS eval by IR. If no TIPS GI to plan EVL. Also scheduled [facility]. GI wanting Protonix infusion and Vitamin K. Patient does have ascites, will need paracentesis following endoscopy planned for tomorrow. Already got ceftriaxone so will need to treat for the full course of SBP given that paracentesis was not performed. -IV ceftriaxone -Protonix  infusion -IV octreotide -vitamin K -transfuse 1 u pRBC -CBC '@2200'$ , AM CBC -NPO  -GI following  Lactic acidosis Lactate noted to be 4. Patient with previous leukopenia to 2.9 5 days ago now with WBC to 7.1. Will get blood cultures. Otherwise afebrile with no evidence of pulmonary infection, skin or soft tissue infection. Is being treated with ceftriaxone for UGIB with cirrhosis, will also empirically cover SBP. No EKG on admission, will get this now given elevated lactate and tachycardia. This is most likely secondary to acute blood loss anemia. -s/p 37.'5mg'$  albumin -lactate q3 hours -f/u bcx -f/u EKG   Dispo: Admit patient to Inpatient with expected length of stay greater than 2 midnights.  Signed: RIona Coach MD 03/19/2022, 5:14 PM  Pager: TIona Coach MD Internal Medicine Resident, PGY-1 [facility]  Pager: #[phone] 5pm on weekdays and 1pm  on weekends: On Call pager: [phone]

## 2022-03-19 NOTE — ED Notes (Signed)
ED TO INPATIENT HANDOFF REPORT  ED Nurse Name and Phone #:   S Name/Age/Gender Kathy Little 62 y.o. female Room/Bed: 008C/008C  Code Status   Code Status: Full Code  Home/SNF/Other Home Patient oriented to: self, place, time, and situation Is this baseline? No   Triage Complete: Triage complete  Chief Complaint Upper GI bleed [K92.2]  Triage Note Pt bib ems from home; pt had endoscopic procedure x 2 days ago; last night began vomiting bright red blood, having bloody diarrhea; this am vomiting clear fluid; abdomen distended, painful; 126/90, P 110, 97% RA, cbg 152; hx cirrhosis, paracentesis   Allergies Allergies  Allergen Reactions   Amoxicillin    Flagyl [Metronidazole] Nausea Only    Level of Care/Admitting Diagnosis ED Disposition     ED Disposition  Admit   Condition  --   Comment  Hospital Area: [facility] [[id number]]  Level of Care: Progressive [102]  Admit to Progressive based on following criteria: GI, ENDOCRINE disease patients with GI bleeding, acute liver failure or pancreatitis, stable with diabetic ketoacidosis or thyrotoxicosis (hypothyroid) state.  May admit patient to [facility] or [facility] if equivalent level of care is available:: No  Covid Evaluation: Confirmed COVID Negative  Diagnosis: Upper GI bleed [id number]  Admitting Physician: Velna Ochs [id number]  Attending Physician: Velna Ochs [id number]  Certification:: I certify this patient will need inpatient services for at least 2 midnights  Estimated Length of Stay: 3          B Medical/Surgery History Past Medical History:  Diagnosis Date   Atherosclerosis    Cirrhosis ([place])    Hepatitis C 1997   Hepatitis C    Hiatal hernia    History of brachytherapy 11/28/14, 12/05/14. 12/12/14   proximal vagina 24 gray   Radiation 10/10/2014 through 11/07/201610/04/2014 through 11/13/2014   Pelvis 45 gray    Vaginal cancer ([place])    Past Surgical History:   Procedure Laterality Date   COLPOSCOPY  08/30/14   and vaginal biopsy   IR PARACENTESIS  01/22/2022   IR PARACENTESIS  02/21/2022   ROBOTIC ASSISTED LAPAROSCOPIC HYSTERECTOMY AND SALPINGECTOMY  2012   Dr. Loni Muse, [facility]     A IV Location/Drains/Wounds Patient Lines/Drains/Airways Status     Active Line/Drains/Airways     Name Placement date Placement time Site Days   Peripheral IV 01/22/22 20 G 1" Anterior;Right Forearm 01/22/22  0950  Forearm  56   Peripheral IV 02/21/22 22 G 1" Right Forearm 02/21/22  0845  Forearm  26   Peripheral IV 03/19/22 20 G Right Antecubital 03/19/22  1538  Antecubital  less than 1   Peripheral IV 03/19/22 20 G Posterior;Left Forearm 03/19/22  1744  Forearm  less than 1   Peripheral IV 03/19/22 18 G Anterior;Left;Upper Arm 03/19/22  1920  Arm  less than 1            Intake/Output Last 24 hours No intake or output data in the 24 hours ending 03/19/22 2238  Labs/Imaging Results for orders placed or performed during the hospital encounter of 03/19/22 (from the past 48 hour(s))  Lipase, blood     Status: Abnormal   Collection Time: 03/19/22  3:35 PM  Result Value Ref Range   Lipase 69 (H) 11 - 51 U/L    Comment: Performed at [facility], [place] [address] [place], [place] [address]  Brain natriuretic peptide     Status: None   Collection Time: 03/19/22  3:35  PM  Result Value Ref Range   B Natriuretic Peptide 31.4 0.0 - 100.0 pg/mL    Comment: Performed at [facility] [address] [place], [place] [address]  Type and screen Vincent     Status: None (Preliminary result)   Collection Time: 03/19/22  3:35 PM  Result Value Ref Range   ABO/RH(D) A POS    Antibody Screen NEG    Sample Expiration 03/22/2022,2359    Unit Number [id number]    Blood Component Type RED CELLS,LR    Unit division 00    Status of Unit ISSUED    Transfusion Status OK TO TRANSFUSE    Crossmatch Result Compatible    Unit Number  [id number]    Blood Component Type RED CELLS,LR    Unit division 00    Status of Unit ISSUED    Transfusion Status OK TO TRANSFUSE    Crossmatch Result      Compatible Performed at [facility], [place] [address] [place], [place] [address]   Protime-INR     Status: Abnormal   Collection Time: 03/19/22  3:35 PM  Result Value Ref Range   Prothrombin Time 17.9 (H) 11.4 - 15.2 seconds   INR 1.5 (H) 0.8 - 1.2    Comment: (NOTE) INR goal varies based on device and disease states. Performed at [facility], [place] [address], [place], [place] [address]   APTT     Status: None   Collection Time: 03/19/22  3:35 PM  Result Value Ref Range   aPTT 34 24 - 36 seconds    Comment: Performed at [facility] [address], [place], [place] [address]  Resp panel by RT-PCR (RSV, Flu A&B, Covid) Anterior Nasal Swab     Status: None   Collection Time: 03/19/22  3:45 PM   Specimen: Anterior Nasal Swab  Result Value Ref Range   SARS Coronavirus 2 by RT PCR NEGATIVE NEGATIVE   Influenza A by PCR NEGATIVE NEGATIVE   Influenza B by PCR NEGATIVE NEGATIVE    Comment: (NOTE) The Xpert Xpress SARS-CoV-2/FLU/RSV plus assay is intended as an aid in the diagnosis of influenza from Nasopharyngeal swab specimens and should not be used as a sole basis for treatment. Nasal washings and aspirates are unacceptable for Xpert Xpress SARS-CoV-2/FLU/RSV testing.  Fact Sheet for Patients: EntrepreneurPulse.com.au  Fact Sheet for Healthcare Providers: IncredibleEmployment.be  This test is not yet approved or cleared by the [place] FDA and has been authorized for detection and/or diagnosis of SARS-CoV-2 by FDA under an Emergency Use Authorization (EUA). This EUA will remain in effect (meaning this test can be used) for the duration of the COVID-19 declaration under Section 564(b)(1) of the Act, 21 U.S.C. section 360bbb-3(b)(1), unless the authorization is  terminated or revoked.     Resp Syncytial Virus by PCR NEGATIVE NEGATIVE    Comment: (NOTE) Fact Sheet for Patients: EntrepreneurPulse.com.au  Fact Sheet for Healthcare Providers: IncredibleEmployment.be  This test is not yet approved or cleared by the [place] FDA and has been authorized for detection and/or diagnosis of SARS-CoV-2 by FDA under an Emergency Use Authorization (EUA). This EUA will remain in effect (meaning this test can be used) for the duration of the COVID-19 declaration under Section 564(b)(1) of the Act, 21 U.S.C. section 360bbb-3(b)(1), unless the authorization is terminated or revoked.  Performed at [facility], [place] [address], [place], [place] [address]   ABO/Rh  Status: None   Collection Time: 03/19/22  4:03 PM  Result Value Ref Range   ABO/RH(D)      A POS Performed at [facility], [place] [address] [place], [place] [address]   I-Stat Chem 8, ED     Status: Abnormal   Collection Time: 03/19/22  4:20 PM  Result Value Ref Range   Sodium 134 (L) 135 - 145 mmol/L   Potassium 4.4 3.5 - 5.1 mmol/L   Chloride 101 98 - 111 mmol/L   BUN 25 (H) 8 - 23 mg/dL   Creatinine, Ser 0.50 0.44 - 1.00 mg/dL   Glucose, Bld 121 (H) 70 - 99 mg/dL    Comment: Glucose reference range applies only to samples taken after fasting for at least 8 hours.   Calcium, Ion 1.08 (L) 1.15 - 1.40 mmol/L   TCO2 22 22 - 32 mmol/L   Hemoglobin 10.2 (L) 12.0 - 15.0 g/dL   HCT 30.0 (L) 36.0 - 46.0 %  CBC with Differential     Status: Abnormal   Collection Time: 03/19/22  4:29 PM  Result Value Ref Range   WBC 7.1 4.0 - 10.5 K/uL   RBC 2.99 (L) 3.87 - 5.11 MIL/uL   Hemoglobin 10.3 (L) 12.0 - 15.0 g/dL   HCT 31.3 (L) 36.0 - 46.0 %   MCV 104.7 (H) 80.0 - 100.0 fL   MCH 34.4 (H) 26.0 - 34.0 pg   MCHC 32.9 30.0 - 36.0 g/dL   RDW 17.2 (H) 11.5 - 15.5 %   Platelets 111 (L) 150 - 400 K/uL   nRBC 0.0 0.0 - 0.2 %   Neutrophils  Relative % 80 %   Neutro Abs 5.6 1.7 - 7.7 K/uL   Lymphocytes Relative 9 %   Lymphs Abs 0.7 0.7 - 4.0 K/uL   Monocytes Relative 9 %   Monocytes Absolute 0.7 0.1 - 1.0 K/uL   Eosinophils Relative 0 %   Eosinophils Absolute 0.0 0.0 - 0.5 K/uL   Basophils Relative 0 %   Basophils Absolute 0.0 0.0 - 0.1 K/uL   Immature Granulocytes 2 %   Abs Immature Granulocytes 0.11 (H) 0.00 - 0.07 K/uL    Comment: Performed at [facility], [address], [place], [place] [address]  Comprehensive metabolic panel     Status: Abnormal   Collection Time: 03/19/22  4:29 PM  Result Value Ref Range   Sodium 131 (L) 135 - 145 mmol/L   Potassium 4.3 3.5 - 5.1 mmol/L    Comment: HEMOLYSIS AT THIS LEVEL MAY AFFECT RESULT   Chloride 103 98 - 111 mmol/L   CO2 18 (L) 22 - 32 mmol/L   Glucose, Bld 127 (H) 70 - 99 mg/dL    Comment: Glucose reference range applies only to samples taken after fasting for at least 8 hours.   BUN 19 8 - 23 mg/dL   Creatinine, Ser 0.70 0.44 - 1.00 mg/dL   Calcium 7.9 (L) 8.9 - 10.3 mg/dL   Total Protein 6.0 (L) 6.5 - 8.1 g/dL   Albumin 2.0 (L) 3.5 - 5.0 g/dL   AST 168 (H) 15 - 41 U/L    Comment: HEMOLYSIS AT THIS LEVEL MAY AFFECT RESULT   ALT 68 (H) 0 - 44 U/L    Comment: HEMOLYSIS AT THIS LEVEL MAY AFFECT RESULT   Alkaline Phosphatase 136 (H) 38 - 126 U/L   Total Bilirubin 3.7 (H) 0.3 - 1.2 mg/dL    Comment: HEMOLYSIS AT  THIS LEVEL MAY AFFECT RESULT   GFR, Estimated >60 >60 mL/min    Comment: (NOTE) Calculated using the CKD-EPI Creatinine Equation (2021)    Anion gap 10 5 - 15    Comment: Performed at [facility], [place] [address] [place], [place] [address]  Lactic acid, plasma     Status: Abnormal   Collection Time: 03/19/22  4:30 PM  Result Value Ref Range   Lactic Acid, Venous 4.0 (HH) 0.5 - 1.9 mmol/L    Comment: CRITICAL RESULT CALLED TO, READ BACK BY AND VERIFIED WITH L Hermes Wafer,RN 1708 03/19/2022 WBOND Performed at [facility], [place] [address]  [place], [place] [address]   Prepare RBC (crossmatch)     Status: None   Collection Time: 03/19/22  6:20 PM  Result Value Ref Range   Order Confirmation      ORDER PROCESSED BY BLOOD BANK Performed at [facility], [place] [address], [place], [place] [address]   CBC     Status: Abnormal   Collection Time: 03/19/22  7:17 PM  Result Value Ref Range   WBC 9.4 4.0 - 10.5 K/uL   RBC 2.68 (L) 3.87 - 5.11 MIL/uL   Hemoglobin 9.4 (L) 12.0 - 15.0 g/dL   HCT 28.3 (L) 36.0 - 46.0 %   MCV 105.6 (H) 80.0 - 100.0 fL   MCH 35.1 (H) 26.0 - 34.0 pg   MCHC 33.2 30.0 - 36.0 g/dL   RDW 17.2 (H) 11.5 - 15.5 %   Platelets 121 (L) 150 - 400 K/uL   nRBC 0.3 (H) 0.0 - 0.2 %    Comment: Performed at [facility], [place] [address] [place], [place] [address]  Prepare RBC (crossmatch)     Status: None   Collection Time: 03/19/22  7:45 PM  Result Value Ref Range   Order Confirmation      ORDER PROCESSED BY BLOOD BANK Performed at [facility], [place] [address] [place], [place] [address]   Lactic acid, plasma     Status: Abnormal   Collection Time: 03/19/22  8:00 PM  Result Value Ref Range   Lactic Acid, Venous 6.5 (HH) 0.5 - 1.9 mmol/L    Comment: CRITICAL VALUE NOTED. VALUE IS CONSISTENT WITH PREVIOUSLY REPORTED/CALLED VALUE Performed at [facility], [place] [address], [place], [place] [address]    DG Chest Portable 1 View  Result Date: 03/19/2022 CLINICAL DATA:  Hematemesis.  Recent endoscopy. EXAM: PORTABLE CHEST 1 VIEW COMPARISON:  None Available. FINDINGS: The heart is normal in size. The mediastinal and hilar contours are normal. Minimal streaky basilar scarring changes versus subsegmental atelectasis. No pleural effusions or pulmonary infiltrates. The bony thorax is intact. IMPRESSION: 1. Minimal streaky basilar scarring changes versus subsegmental atelectasis. 2. No infiltrates or effusions. Electronically Signed   By: Marijo Sanes M.D.   On: 03/19/2022 15:52    Pending  Labs Unresulted Labs (From admission, onward)     Start     Ordered   03/20/22 0500  CBC  Tomorrow morning,   R        03/19/22 1745   03/20/22 0500  Comprehensive metabolic panel  Tomorrow morning,   R        03/19/22 1745   03/20/22 0500  HIV Antibody (routine testing w rflx)  (HIV Antibody (Routine testing w reflex) panel)  Tomorrow morning,   R        03/19/22 1748   03/19/22 1911  CBC  Now then every 4 hours,   R (with TIMED occurrences)      03/19/22 1910   03/19/22 1827  Lactic acid, plasma  STAT Now then every 3 hours,   R (with STAT occurrences)      03/19/22 1827   03/19/22 1749  Culture, blood (Routine X 2) w Reflex to ID Panel  BLOOD CULTURE X 2,   R (with TIMED occurrences)      03/19/22 1748            Vitals/Pain Today's Vitals   03/19/22 2130 03/19/22 2145 03/19/22 2200 03/19/22 2215  BP: 118/81 123/89 117/88 118/85  Pulse: (!) 124 (!) 127 (!) 128 (!) 124  Resp: (!) 23 20 (!) 21 (!) 23  Temp:      TempSrc:      SpO2: 96% 97% 97% 95%    Isolation Precautions No active isolations  Medications Medications  octreotide (SANDOSTATIN) 2 mcg/mL load via infusion 50 mcg (50 mcg Intravenous Bolus from Bag 03/19/22 1620)    And  octreotide (SANDOSTATIN) 500 mcg in sodium chloride 0.9 % 250 mL (2 mcg/mL) infusion (50 mcg/hr Intravenous New Bag/Given 03/19/22 1620)  pantoprozole (PROTONIX) 80 mg /NS 100 mL infusion (has no administration in time range)  ondansetron (ZOFRAN) tablet 4 mg (has no administration in time range)    Or  ondansetron (ZOFRAN) injection 4 mg (has no administration in time range)  albumin human 25 % solution 25 g (has no administration in time range)  0.9 %  sodium chloride infusion (Manually program via Guardrails IV Fluids) (has no administration in time range)  cefTRIAXone (ROCEPHIN) 1 g in sodium chloride 0.9 % 100 mL IVPB (has no administration in time range)  midodrine (PROAMATINE) tablet 15 mg (15 mg Oral Given 03/19/22 2127)   pantoprazole (PROTONIX) injection 40 mg (40 mg Intravenous Given 03/19/22 1555)  ondansetron (ZOFRAN) injection 4 mg (4 mg Intravenous Given 03/19/22 1555)  cefTRIAXone (ROCEPHIN) 1 g in sodium chloride 0.9 % 100 mL IVPB (0 g Intravenous Stopped 03/19/22 1843)  metoCLOPramide (REGLAN) injection 10 mg (10 mg Intravenous Given 03/19/22 1841)  albumin human 5 % solution 12.5 g (12.5 g Intravenous New Bag/Given 03/19/22 2005)  pantoprazole (PROTONIX) injection 40 mg (40 mg Intravenous Given 03/19/22 1830)  phytonadione (VITAMIN K) 10 mg in dextrose 5 % 50 mL IVPB (0 mg Intravenous Stopped 03/19/22 1937)  0.9 %  sodium chloride infusion (Manually program via Guardrails IV Fluids) ( Intravenous New Bag/Given 03/19/22 1926)    Mobility walks     Focused Assessments    R Recommendations: See Admitting Provider Note  Report given to:   Additional Notes:

## 2022-03-19 NOTE — ED Notes (Signed)
one episode of hematemesis noted while in triage; Dr. Mayra Neer notified

## 2022-03-19 NOTE — Hospital Course (Addendum)
-  Hepatology clinic in April -Last night, dark red emesis with some bright red. This morning, clear emesis. Last emesis 2 hour ago. But developed multiple dark red/brown diarrhea. No long have diarrhea -No light headedness -Mild CP but go away  -? Ascites. Abd distention is back. Wt 159 (was 138)  Took her meds this AM  -Live w daughter and son Independent w ADLs Work at [employer] No smoking, alcohol, drugs  -Mom: liver prob, heart issue Dad: heart attack, DM   -Endo tomorrow AM  -If bleeding, call GI and ICU  03/14: Patient reports having diarrhea and dry heaving overnight. She had about 7 BM overnight. No LH, CP, or dizziness. Has not been out of bed at all. Explained pllan to the patient and answered all of her questions.   AxOx3  Resend cultures   3/15 - reeports bloody diarrhea last night Alert oriented to person, place, not time. Oriented to situatoin.  She feels like she is really confused. Couldn't think straight or remember things.   03/16: Patient states she is doing better but she is having some abdominal pain this morning from the site of the paracentesis. She does not that she does not have any more blood in vomit. She is unsure if she has bleeding in the stool. Had 3-4 bowel movements yesterday. Explained the plan to the patient and answered all her questions.   3/17: AO x 4. States that she feels better and clear headed. No BM since yes ?Marland Kitchen No N/V. No bleeding episodes. Feels like fluid already re- accumulated.   Increased lactulose to 20 g TID.   03/18: Patient reports that her thoughts are better this morning. She does not feel confused and is sleeping okay but has to get up to use the bathroom a lot. She is having abdominal pain. She denies any blood in her stool. She denies any nausea or vomiting. She reports that her feet her numb and tingling today. Explained the plan to the patient and answered her questions.   03/19: She states she is feeling better. She  doe note that she needs more fluid off. She ate some french toast and some coffee. Denies any nausea or vomiting, She had a big bowel movement today and denies any blood in her stool. She is agreeable to see our clinic for primary care.

## 2022-03-19 NOTE — ED Provider Notes (Signed)
[facility] Provider Note   CSN: [id number] Arrival date & time: 03/19/22  1426     History  Chief Complaint  Patient presents with   Abdominal Pain   Hematemesis    Kathy Little is a 62 y.o. female with cirrhosis of the liver with ascites, esophageal varices, chronic hep C who presents with hematemesis.   Per chart review patient w/ cirrhosis had EGD with GI on 03/17/2022.  Found to have grade 3 large varices in the lower third of the esophagus and moderate portal hypertensive gastropathy at home.  Given the size of the varices they are considering EVL versus early TIPS and recommended echo for pre-TIPS evaluation and IR referral.  She takes Lasix Prilosec spironolactone.  Pt bib ems from home. Last night began vomiting bright red mixed with dark red blood, having melena as well; this am vomiting clear yellow fluid. Patient reports abdominal bloating and stretching discomfort due to swelling but no abdominal pain. Endorses intermittent SOB and chest pain as well. No f/c, urinary symptoms, vaginal symptoms, changes to bowel habits.    Abdominal Pain      Home Medications Prior to Admission medications   Medication Sig Start Date End Date Taking? Authorizing Provider  furosemide (LASIX) 40 MG tablet Take 2 tablets (80 mg total) by mouth daily. 02/19/22   Noralyn Pick, NP  omeprazole (PRILOSEC) 40 MG capsule Take 1 capsule (40 mg total) by mouth daily. 01/17/22   Noralyn Pick, NP  potassium chloride SA (KLOR-CON M) 20 MEQ tablet Take 1 tablet (20 mEq total) by mouth daily. 03/03/22 04/02/22  Noralyn Pick, NP  spironolactone (ALDACTONE) 100 MG tablet Take 2 tablets (200 mg total) by mouth daily. 02/19/22   Noralyn Pick, NP      Allergies    Amoxicillin and Flagyl [metronidazole]    Review of Systems   Review of Systems  Gastrointestinal:  Positive for abdominal pain.   Review of systems  Negative for f/c.  A 10 point review of systems was performed and is negative unless otherwise reported in HPI.  Physical Exam Updated Vital Signs BP 130/88 (BP Location: Left Arm)   Pulse (!) 56   Temp 97.6 F (36.4 C)   Resp 18   SpO2 99%  Physical Exam General: Chronically ill-appearing female, lying in bed.  HEENT: PERRLA, Sclera icteric, MMM, trachea midline. Dried dark red blood on teeth.  Cardiology: RRR, no murmurs/rubs/gallops. BL radial and DP pulses equal bilaterally.  Resp: Normal respiratory rate and effort. CTAB, no wheezes, rhonchi, crackles.  Abd: Rotund, distended abdomen. Not tender to palpation.  No rebound tenderness or guarding.  GU: Deferred. MSK: No peripheral edema or signs of trauma. Extremities without deformity or TTP. No cyanosis or clubbing. Skin: jaundiced, warm, dry. No rashes or lesions. Back: No CVA tenderness Neuro: A&Ox4, CNs II-XII grossly intact. MAEs. Sensation grossly intact.  Psych: Normal mood and affect.   ED Results / Procedures / Treatments   Labs (all labs ordered are listed, but only abnormal results are displayed) Labs Reviewed  CBC WITH DIFFERENTIAL/PLATELET  COMPREHENSIVE METABOLIC PANEL  PROTIME-INR  APTT  LACTIC ACID, PLASMA  LACTIC ACID, PLASMA    EKG None  Radiology No results found.  Procedures Procedures    Medications Ordered in ED Medications  pantoprazole (PROTONIX) injection 40 mg (has no administration in time range)  ondansetron (ZOFRAN) injection 4 mg (has no administration in time range)  ED Course/ Medical Decision Making/ A&P                          Medical Decision Making Amount and/or Complexity of Data Reviewed Labs: ordered. Decision-making details documented in ED Course. Radiology: ordered. ECG/medicine tests: ordered.  Risk Prescription drug management. Decision regarding hospitalization.    This patient presents to the ED for concern of hematemesis w/ known esophageal varices;  this involves an extensive number of treatment options, and is a complaint that carries with it a high risk of complications and morbidity.  I considered the following differential and admission for this acute, potentially life threatening condition.   MDM:    On initial evaluation patient is not actively vomiting and is controlling her airway well.  She is hemodynamically stable, actually mildly bradycardic and normotensive, no sign of acute hemorrhagic or septic shock.  Patient with known esophageal varices now with bright red/dark red hematemesis and melena.  Greatest concern that she is having a variceal bleed. Also consider mallory weiss tear vs boerhaave's syndrome, PUD/gastritis. Will eval for electrolyte abnormalities or acute blood loss anemia though what she has vomited here does not appear to be large in volume, unclear the volume she had at home. Will give Protonix bolus as well as octreotide and ceftriaxone and obtain basic labs.  She has no fever or significant abdominal tenderness palpation to raise concern for SBP and I do not believe that a diagnostic paracentesis is necessary at this time.  The likely during an admission she will benefit from a therapeutic paracentesis.  Clinical Course as of 04/05/22 1034  Wed Mar 19, 2022  1622 Received sign out from Dr. Mayra Neer. Presenting with hematemesis. Had an endoscopy 3 days ago showing varices.  [WS]  [id number] Discussed with IM teaching service who will admit. Discussed with [facility] who will see patient.  [WS]  [id number] Lactic Acid, Venous(!!): 4.0 Likely mainly type B due to cirrhosis. GI also recommends 10mg  IV vitamin K and PPI infusion which has been ordered.GI will evaluate for endoscopy [WS]    Clinical Course User Index [WS] Cristie Hem, MD    Labs: I Ordered, and personally interpreted labs.  The pertinent results include:  those listed above  Imaging Studies ordered: I ordered imaging studies including chest x-ray I  independently visualized and interpreted imaging. I agree with the radiologist interpretation  Additional history obtained from chart review.    Cardiac Monitoring: The patient was maintained on a cardiac monitor.  I personally viewed and interpreted the cardiac monitored which showed an underlying rhythm of: NSR/sinus bradycardia  Social Determinants of Health: Patient lives independently   Disposition:  Patient is signed out to the oncoming ED physician who is made aware of her history, presentation, exam, workup, and plan.  Plan is to obtain labs and likely consult to GI for endoscopy and admit patient.  Co morbidities that complicate the patient evaluation  Past Medical History:  Diagnosis Date   Atherosclerosis    Cirrhosis ([place])    Hepatitis C 1997   Hepatitis C    Hiatal hernia    History of brachytherapy 11/28/14, 12/05/14. 12/12/14   proximal vagina 24 gray   Radiation 10/10/2014 through 11/07/201610/04/2014 through 11/13/2014   Pelvis 45 gray    Vaginal cancer ([place])      Medicines Meds ordered this encounter  Medications   pantoprazole (PROTONIX) injection 40 mg   ondansetron (ZOFRAN) injection 4 mg  I have reviewed the patients home medicines and have made adjustments as needed  Problem List / ED Course: Problem List Items Addressed This Visit       Digestive   Upper GI bleed - Primary   Relevant Orders   Cytology - Non PAP; (Completed)   Body fluid cell count with differential (Completed)   Albumin, pleural or peritoneal fluid  (Completed)   Protein, pleural or peritoneal fluid (Completed)   Body fluid cell count with differential (Completed)   Body fluid culture w Gram Stain (Completed)   Cirrhosis of liver with ascites (HCC)   Relevant Orders   Cytology - Non PAP; (Completed)   Body fluid cell count with differential (Completed)   Albumin, pleural or peritoneal fluid  (Completed)   Protein, pleural or peritoneal fluid (Completed)   Body fluid  cell count with differential (Completed)   Body fluid culture w Gram Stain (Completed)                This note was created using dictation software, which may contain spelling or grammatical errors.    Audley Hose, MD 04/05/22 [phone]

## 2022-03-19 NOTE — Telephone Encounter (Signed)
The pt has been advised and will call EMS now.

## 2022-03-19 NOTE — Telephone Encounter (Signed)
Patient should call 911 and be brought to the emergency room immediately Patient with cirrhosis, recently discovered large esophageal varices, portal hypertension in the setting of hepatitis C cirrhosis

## 2022-03-20 ENCOUNTER — Inpatient Hospital Stay (HOSPITAL_COMMUNITY): Payer: Medicaid Other | Admitting: Anesthesiology

## 2022-03-20 ENCOUNTER — Inpatient Hospital Stay (HOSPITAL_COMMUNITY): Payer: Medicaid Other

## 2022-03-20 ENCOUNTER — Encounter (HOSPITAL_COMMUNITY): Payer: Self-pay | Admitting: Internal Medicine

## 2022-03-20 ENCOUNTER — Encounter (HOSPITAL_COMMUNITY)
Admission: EM | Disposition: A | Discharge status: Home / Self Care | Payer: Self-pay | Source: Home / Self Care | Attending: Internal Medicine

## 2022-03-20 ENCOUNTER — Other Ambulatory Visit: Payer: Self-pay

## 2022-03-20 DIAGNOSIS — K921 Melena: Secondary | ICD-10-CM

## 2022-03-20 DIAGNOSIS — Z87891 Personal history of nicotine dependence: Secondary | ICD-10-CM

## 2022-03-20 DIAGNOSIS — I2510 Atherosclerotic heart disease of native coronary artery without angina pectoris: Secondary | ICD-10-CM

## 2022-03-20 DIAGNOSIS — K449 Diaphragmatic hernia without obstruction or gangrene: Secondary | ICD-10-CM

## 2022-03-20 DIAGNOSIS — K3189 Other diseases of stomach and duodenum: Secondary | ICD-10-CM

## 2022-03-20 DIAGNOSIS — K766 Portal hypertension: Secondary | ICD-10-CM

## 2022-03-20 DIAGNOSIS — K920 Hematemesis: Secondary | ICD-10-CM

## 2022-03-20 DIAGNOSIS — K7460 Unspecified cirrhosis of liver: Secondary | ICD-10-CM

## 2022-03-20 DIAGNOSIS — K2289 Other specified disease of esophagus: Secondary | ICD-10-CM

## 2022-03-20 DIAGNOSIS — I8501 Esophageal varices with bleeding: Secondary | ICD-10-CM

## 2022-03-20 DIAGNOSIS — K922 Gastrointestinal hemorrhage, unspecified: Secondary | ICD-10-CM

## 2022-03-20 DIAGNOSIS — I8511 Secondary esophageal varices with bleeding: Secondary | ICD-10-CM

## 2022-03-20 DIAGNOSIS — R188 Other ascites: Secondary | ICD-10-CM | POA: Insufficient documentation

## 2022-03-20 DIAGNOSIS — R9431 Abnormal electrocardiogram [ECG] [EKG]: Secondary | ICD-10-CM

## 2022-03-20 DIAGNOSIS — D62 Acute posthemorrhagic anemia: Secondary | ICD-10-CM

## 2022-03-20 HISTORY — PX: ESOPHAGOGASTRODUODENOSCOPY (EGD) WITH PROPOFOL: SHX5813

## 2022-03-20 HISTORY — PX: ESOPHAGEAL BANDING: SHX5518

## 2022-03-20 LAB — LACTIC ACID, PLASMA
Lactic Acid, Venous: 5.3 mmol/L (ref 0.5–1.9)
Lactic Acid, Venous: 6.2 mmol/L (ref 0.5–1.9)
Lactic Acid, Venous: 6.1 mmol/L (ref 0.5–1.9)

## 2022-03-20 LAB — PROTIME-INR
INR: 1.9 — ABNORMAL HIGH (ref 0.8–1.2)
Prothrombin Time: 21.5 seconds — ABNORMAL HIGH (ref 11.4–15.2)

## 2022-03-20 LAB — ECHOCARDIOGRAM COMPLETE
AR max vel: 2.23 cm2
AV Area VTI: 2.07 cm2
AV Area mean vel: 2.04 cm2
AV Mean grad: 6.5 mmHg
AV Peak grad: 10.9 mmHg
Ao pk vel: 1.65 m/s
Area-P 1/2: 7.59 cm2
Height: 67 in
S' Lateral: 3 cm
Weight: 2529.12 oz

## 2022-03-20 LAB — COMPREHENSIVE METABOLIC PANEL
ALT: 117 U/L — ABNORMAL HIGH (ref 0–44)
AST: 314 U/L — ABNORMAL HIGH (ref 15–41)
Albumin: 2.2 g/dL — ABNORMAL LOW (ref 3.5–5.0)
Alkaline Phosphatase: 105 U/L (ref 38–126)
Anion gap: 10 (ref 5–15)
BUN: 25 mg/dL — ABNORMAL HIGH (ref 8–23)
CO2: 18 mmol/L — ABNORMAL LOW (ref 22–32)
Calcium: 8.4 mg/dL — ABNORMAL LOW (ref 8.9–10.3)
Chloride: 103 mmol/L (ref 98–111)
Creatinine, Ser: 0.87 mg/dL (ref 0.44–1.00)
GFR, Estimated: >60 mL/min (ref >60)
Glucose, Bld: 100 mg/dL — ABNORMAL HIGH (ref 70–99)
Potassium: 5.1 mmol/L (ref 3.5–5.1)
Sodium: 131 mmol/L — ABNORMAL LOW (ref 135–145)
Total Bilirubin: 6.2 mg/dL — ABNORMAL HIGH (ref 0.3–1.2)
Total Protein: 5.6 g/dL — ABNORMAL LOW (ref 6.5–8.1)

## 2022-03-20 LAB — CBC
HCT: 31.6 % — ABNORMAL LOW (ref 36.0–46.0)
HCT: 30.7 % — ABNORMAL LOW (ref 36.0–46.0)
HCT: 26.3 % — ABNORMAL LOW (ref 36.0–46.0)
Hemoglobin: 11.1 g/dL — ABNORMAL LOW (ref 12.0–15.0)
Hemoglobin: 10.5 g/dL — ABNORMAL LOW (ref 12.0–15.0)
Hemoglobin: 9.2 g/dL — ABNORMAL LOW (ref 12.0–15.0)
MCH: 32.6 pg (ref 26.0–34.0)
MCH: 32.2 pg (ref 26.0–34.0)
MCH: 32.7 pg (ref 26.0–34.0)
MCHC: 35.1 g/dL (ref 30.0–36.0)
MCHC: 34.2 g/dL (ref 30.0–36.0)
MCHC: 35 g/dL (ref 30.0–36.0)
MCV: 92.9 fL (ref 80.0–100.0)
MCV: 94.2 fL (ref 80.0–100.0)
MCV: 93.6 fL (ref 80.0–100.0)
Platelets: 118 10*3/uL — ABNORMAL LOW (ref 150–400)
Platelets: 107 10*3/uL — ABNORMAL LOW (ref 150–400)
Platelets: 117 10*3/uL — ABNORMAL LOW (ref 150–400)
RBC: 3.4 MIL/uL — ABNORMAL LOW (ref 3.87–5.11)
RBC: 3.26 MIL/uL — ABNORMAL LOW (ref 3.87–5.11)
RBC: 2.81 MIL/uL — ABNORMAL LOW (ref 3.87–5.11)
RDW: 21.2 % — ABNORMAL HIGH (ref 11.5–15.5)
RDW: 21.5 % — ABNORMAL HIGH (ref 11.5–15.5)
RDW: 21.5 % — ABNORMAL HIGH (ref 11.5–15.5)
WBC: 9.2 10*3/uL (ref 4.0–10.5)
WBC: 9.7 10*3/uL (ref 4.0–10.5)
WBC: 9.8 10*3/uL (ref 4.0–10.5)
nRBC: 0.2 % (ref 0.0–0.2)
nRBC: 0.3 % — ABNORMAL HIGH (ref 0.0–0.2)
nRBC: 0.7 % — ABNORMAL HIGH (ref 0.0–0.2)

## 2022-03-20 LAB — BPAM RBC
Blood Product Expiration Date: 202404052359
Blood Product Expiration Date: 202404052359
ISSUE DATE / TIME: 202403131901
ISSUE DATE / TIME: 202403132119
Unit Type and Rh: 6200
Unit Type and Rh: 6200

## 2022-03-20 LAB — TYPE AND SCREEN
ABO/RH(D): A POS
Antibody Screen: NEGATIVE
Unit division: 0
Unit division: 0

## 2022-03-20 LAB — HIV ANTIBODY (ROUTINE TESTING W REFLEX): HIV Screen 4th Generation wRfx: NONREACTIVE

## 2022-03-20 SURGERY — ESOPHAGOGASTRODUODENOSCOPY (EGD) WITH PROPOFOL
Anesthesia: Monitor Anesthesia Care

## 2022-03-20 MED ORDER — SODIUM CHLORIDE 0.9 % IV SOLN: INTRAVENOUS | Status: DC

## 2022-03-20 MED ORDER — ONDANSETRON HCL 4 MG/2ML IJ SOLN
INTRAMUSCULAR | Status: DC | PRN
  Administered 2022-03-20: 8 mg via INTRAVENOUS

## 2022-03-20 MED ORDER — VITAMIN K1 10 MG/ML IJ SOLN
10.0000 mg | Freq: Once | INTRAVENOUS | Status: AC
  Administered 2022-03-20: 10 mg via INTRAVENOUS
  Filled 2022-03-20: qty 1

## 2022-03-20 MED ORDER — PROPOFOL 10 MG/ML IV BOLUS
INTRAVENOUS | Status: DC | PRN
  Administered 2022-03-20: 50 mg via INTRAVENOUS
  Administered 2022-03-20: 75 ug/kg/min via INTRAVENOUS
  Administered 2022-03-20: 30 mg via INTRAVENOUS

## 2022-03-20 MED ORDER — SUCRALFATE 1 GM/10ML PO SUSP
1.0000 g | Freq: Two times a day (BID) | ORAL | Status: DC
  Administered 2022-03-20 – 2022-03-25 (×11): 1 g via ORAL
  Filled 2022-03-20 (×12): qty 10

## 2022-03-20 MED ORDER — LIDOCAINE 2% (20 MG/ML) 5 ML SYRINGE
INTRAMUSCULAR | Status: DC | PRN
  Administered 2022-03-20: 40 mg via INTRAVENOUS

## 2022-03-20 MED ORDER — IBUPROFEN 200 MG PO TABS
200.0000 mg | ORAL_TABLET | Freq: Four times a day (QID) | ORAL | Status: DC | PRN
  Administered 2022-03-20 – 2022-03-24 (×5): 200 mg via ORAL
  Filled 2022-03-20 (×5): qty 1

## 2022-03-20 MED ORDER — LACTATED RINGERS IV SOLN: INTRAVENOUS | Status: DC

## 2022-03-20 SURGICAL SUPPLY — 15 items

## 2022-03-20 NOTE — Progress Notes (Signed)
Remain alert and verbally responsive, abdomen tender to touch, and distended. Continue to have bloody stool. Zofran iv given for c/o nausea.2 units of PRBC completed with no adverse reaction .Hemoglobin post transfusion=11.1

## 2022-03-20 NOTE — Anesthesia Postprocedure Evaluation (Signed)
Anesthesia Post Note  Patient: Kathy Little  Procedure(s) Performed: ESOPHAGOGASTRODUODENOSCOPY (EGD) WITH PROPOFOL ESOPHAGEAL BANDING     Patient location during evaluation: PACU Anesthesia Type: MAC Level of consciousness: awake and alert Pain management: pain level controlled Vital Signs Assessment: post-procedure vital signs reviewed and stable Respiratory status: spontaneous breathing, nonlabored ventilation and respiratory function stable Cardiovascular status: blood pressure returned to baseline Postop Assessment: no apparent nausea or vomiting Anesthetic complications: no   No notable events documented.  Last Vitals:  Vitals:   03/20/22 1225 03/20/22 1235  BP: (!) 109/40   Pulse: (!) 111 (!) 110  Resp: (!) 32 17  Temp:    SpO2: 96% 96%    Last Pain:  Vitals:   03/20/22 1150  TempSrc:   PainSc: Asleep                 Marthenia Rolling

## 2022-03-20 NOTE — Progress Notes (Addendum)
   Subjective: Patient reports having diarrhea and dry heaving overnight. She had about 7 BM overnight. No LH, CP, or dizziness. Has not been out of bed at all. Explained pllan to the patient and answered all of her questions.   Objective:  Vital signs in last 24 hours: Vitals:   03/20/22 1150 03/20/22 1200 03/20/22 1225 03/20/22 1235  BP: 138/64 127/65 (!) 109/40   Pulse: (!) 119 (!) 112 (!) 111 (!) 110  Resp: (!) 30 (!) 26 (!) 32 17  Temp: 97.9 F (36.6 C) 98 F (36.7 C)    TempSrc:      SpO2: 98% 97% 96% 96%  Weight:      Height:      Physical Exam: BTD:[id number] ill appearing, no acute distress, laying in bed cooperatively HEENT:scleral icterus Pulm:normal wob, LCTAB GY:IRSWNIOEVOJ, regular rhythm, no m/r/g, 2+ bilateral radial and DP pulses Abd/GI:soft, distended, no ptp. Diffuse dullness to percussion.  Extremities/skin: warm, jaundiced Neuro: alert and oriented x4, mild asterixis  Assessment/Plan:  Principal Problem:   Upper GI bleed  There was slow active oozing from one of the varices. There was also a nipple sign on another varix. Red wale signs were present as well. Five bands were successfully placed starting at the GE junction with complete eradication, resulting in deflation of varices. There was no bleeding at the end of the procedure. Ms. Parran is a 62 y/o female with a pmh of cirrhosis 2/2 to untreated HCV, esophageal varices who presents with 1 day of hematemesis.   Decompensated Cirrhosis 2/2 HCV Esophageal varices UGIB c/b acute blood loss anemia  Presented with coffee ground emesis and bright blood per rectum. Dry heaving overnight, minimal emesis. Multiple episodes of large volume black watery diarrhea overnight. EGD today showing slow active oozing from one of the recently noted grade III esophageal varices, 5 bands placed and no bleeding at conclusion of the procedure. Mild portal hypertensive gastropathy noted.HDS on midodrine 15 TID, persistently  tachycardic 110 to 120 BPM. Hemoglobin to 10.5 this AM from 9.4 s/p 2 transfusions yesterday, not an appropriate increase. Will check CBC this PM now that bleeding has been fixed. MELD worsened to 25 today, suspect this is from INR to 1.9 and Bili to 6.2. Do suspect some of the bili elevation is from GI bleeding. Echo performed today prior to TIPS evaluation. Continues to endorse discomfort from large volume ascites, will get this drained during admission. -IV ceftriaxone,Protonix infusion,IV octreotide -f/u cbc -NPO  -GI following -contact IR for TIPS evaluation and for paracentesis   Lactic acidosis Blood cultures negative. Persistent lactic acidosis to 6.2, suspect in the setting of acute blood loss anemia. Bicarb minimally decreased to 18.Extremities warm and well perfused. Likely also in the setting of inability of liver to clear lactate. Will give more albumin with paracentesis. No fluids given tense ascites and low albumin. -f/u lactate -IR for paracentesis   Prior to Admission Living Arrangement: Anticipated Discharge Location: Barriers to Discharge: Dispo: Anticipated discharge in approximately 2 day(s).   Iona Coach, MD 03/20/2022, 1:30 PM Pager: Iona Coach, MD Internal Medicine Resident, PGY-1 [facility]  Pager: [phone] After 5pm on weekdays and 1pm on weekends: On Call pager [phone]

## 2022-03-20 NOTE — Op Note (Signed)
[facility] Patient Name: Kathy Little Procedure Date : 03/20/2022 MRN: [id number] Attending MD: Justice Britain , MD, [id number] Date of Birth: 1960-11-03 CSN: [id number] Age: 62 Admit Type: Inpatient Procedure:                Upper GI endoscopy Indications:              Acute post hemorrhagic anemia, Hematemesis,                            Hematochezia, Heme positive stool Providers:                Justice Britain, MD, Dulcy Fanny, Frazier Richards, Technician Referring MD:             Inpatient medical service, Lajuan Lines. Pyrtle, MD Medicines:                Monitored Anesthesia Care Complications:            No immediate complications. Estimated Blood Loss:     Estimated blood loss was minimal. Procedure:                Pre-Anesthesia Assessment:                           - Prior to the procedure, a History and Physical                            was performed, and patient medications and                            allergies were reviewed. The patient's tolerance of                            previous anesthesia was also reviewed. The risks                            and benefits of the procedure and the sedation                            options and risks were discussed with the patient.                            All questions were answered, and informed consent                            was obtained. Prior Anticoagulants: The patient has                            taken no anticoagulant or antiplatelet agents. ASA                            Grade Assessment: III - A patient with severe  systemic disease. After reviewing the risks and                            benefits, the patient was deemed in satisfactory                            condition to undergo the procedure.                           After obtaining informed consent, the endoscope was                            passed under direct vision.  Throughout the                            procedure, the patient's blood pressure, pulse, and                            oxygen saturations were monitored continuously. The                            [id number] Olympus endoscope was                            introduced through the mouth, and advanced to the                            second part of duodenum. The upper GI endoscopy was                            accomplished without difficulty. The patient                            tolerated the procedure. Scope In: Scope Out: Findings:      No gross lesions were noted in the proximal esophagus.      Three columns of grade III varices were were found in the distal       esophagus, 26 to 35 cm from the incisors. There was slow active oozing       from one of the varices. There was also a nipple sign on another varix.       Red wale signs were present as well. Five bands were successfully placed       starting at the GE junction with complete eradication, resulting in       deflation of varices. There was no bleeding at the end of the procedure.      The Z-line was irregular and was found 35 cm from the incisors.      A 2 cm hiatal hernia was present.      Clotted blood was found in the cardia, in the gastric fundus and in the       gastric body. Suction via Endoscope was performed.      Moderate portal hypertensive gastropathy was found in the entire       examined stomach. Oozing and friability was noted upon touching the       gastric mucosa.  No gross lesions were noted in the duodenal bulb, in the first portion       of the duodenum and in the second portion of the duodenum. Impression:               - No gross lesions in the proximal esophagus.                           - Grade III esophageal varices oozing blood.                            Completely eradicated. Banded.                           - Z-line irregular, 35 cm from the incisors.                            - 2 cm hiatal hernia.                           - Clotted blood in the cardia, in the gastric                            fundus and in the gastric body.                           - Portal hypertensive gastropathy.                           - No gross lesions in the duodenal bulb, in the                            first portion of the duodenum and in the second                            portion of the duodenum. Recommendation:           - The patient will be observed post-procedure,                            until all discharge criteria are met.                           - Return patient to hospital ward for ongoing care.                           - Observe patient's clinical course.                           - Continue PPI drip for 72 hours from admission.                           - Continue octreotide for 72 hours from admission.                           - Carafate liquid slurry twice  daily.                           - Ceftriaxone for 5-day course if continues to be                            inpatient otherwise can transition to cefpodoxime                            or ciprofloxacin after first 72 hours.                           - Obtain TTE as previously discussed for evaluation                            of heart function.                           - After discussion with Dr. 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 We would ask to have interventional                            radiology evaluate the patient for candidacy for                            potential TIPS as an inpatient or early outpatient.                           - If patient is a  noncandidate for some reason for                            TIPS, then she will need repeat EGD in 3 to 5 weeks                            for further EVL protocol.                           - The findings and recommendations were discussed  with the patient.                           - The findings and recommendations were discussed                            with the patient's family. Procedure Code(s):        --- Professional ---                           [phone], Esophagogastroduodenoscopy, flexible,                            transoral; with band ligation of esophageal/gastric                            varices Diagnosis Code(s):        --- Professional ---                           I85.01, Esophageal varices with bleeding                           K22.89, Other specified disease of esophagus                           K44.9, Diaphragmatic hernia without obstruction or                            gangrene                           K92.2, Gastrointestinal hemorrhage, unspecified                           K76.6, Portal hypertension                           K31.89, Other diseases of stomach and duodenum                           D62, Acute posthemorrhagic anemia                           K92.0, Hematemesis                           K92.1, Melena (includes Hematochezia)                           R19.5, Other fecal abnormalities CPT copyright 2022 [employer]. All rights reserved. The codes documented in this report are preliminary and upon coder review may  be revised to meet current compliance requirements. Justice Britain, MD 03/20/2022 12:07:44 PM Number of Addenda: 0

## 2022-03-20 NOTE — Anesthesia Preprocedure Evaluation (Addendum)
Anesthesia Evaluation  Patient identified by MRN, date of birth, ID band Patient awake    Reviewed: Allergy & Precautions, NPO status , Patient's Chart, lab work & pertinent test results  History of Anesthesia Complications Negative for: history of anesthetic complications  Airway Mallampati: II  TM Distance: >3 FB Neck ROM: Full    Dental no notable dental hx.    Pulmonary former smoker   Pulmonary exam normal        Cardiovascular + CAD  Normal cardiovascular exam     Neuro/Psych negative neurological ROS     GI/Hepatic hiatal hernia,,,(+) Cirrhosis   Esophageal Varices    , Hepatitis -, CGIB   Endo/Other  negative endocrine ROS    Renal/GU negative Renal ROS     Musculoskeletal negative musculoskeletal ROS (+)    Abdominal   Peds  Hematology  (+) Blood dyscrasia (Hgb 10.5, Plt 107), anemia INR 1.9   Anesthesia Other Findings Day of surgery medications reviewed with patient.  Reproductive/Obstetrics                              Anesthesia Physical Anesthesia Plan  ASA: 4  Anesthesia Plan: MAC   Post-op Pain Management: Minimal or no pain anticipated   Induction:   PONV Risk Score and Plan: 2 and Treatment may vary due to age or medical condition and Propofol infusion  Airway Management Planned: Natural Airway and Nasal Cannula  Additional Equipment: None  Intra-op Plan:   Post-operative Plan:   Informed Consent: I have reviewed the patients History and Physical, chart, labs and discussed the procedure including the risks, benefits and alternatives for the proposed anesthesia with the patient or authorized representative who has indicated his/her understanding and acceptance.       Plan Discussed with: CRNA  Anesthesia Plan Comments:          Anesthesia Quick Evaluation

## 2022-03-20 NOTE — Consult Note (Signed)
Referring Provider: Dr. Maudie Mercury, EDP Primary Care Physician:  Noralyn Pick, NP Primary Gastroenterologist:  Dr. Hilarie Fredrickson  Reason for Consultation:  Hematemesis  HPI: Kathy Little is a 62 y.o. female with a past medical history of recurrent cervical cancer s/p subtotal hysterectomy 2012 treated with brachytherapy 2016, chronic leukopenia, thrombocytopenia, chronic hepatitis C gentoype 1a who was recently diagnosed with decompensated cirrhosis.   She underwent EGD on 03/17/22 with grade 3 and large esophageal varices.  This was performed in our outpatient center so banding was not performed.  Consideration was given to perform EGD with banding in the hospital setting versus TIPS as she also has ascites and TIPS would manage both.  Unfortunately she developed hematemesis on the night of 3/12.  She says that it was a lot, half a bucket.  No further vomiting but passing blood in stool now.  Came to the ED.  Started on octreotide drip, PPI drip, and Rocephin.  INR 1.9 despite 10 mg of IV vitamin K.  Hgb 10.5 grams down from 13.5-14.5 grams baseline, this was after 2 units of packed red blood cells.  States that she has some abdominal pressure.  Had this previously and then after paracentesis was performed last time pain got better.  Had 6 L removed on 01/22/2022.   Past Medical History:  Diagnosis Date   Atherosclerosis    Cirrhosis ([place])    Hepatitis C 1997   Hepatitis C    Hiatal hernia    History of brachytherapy 11/28/14, 12/05/14. 12/12/14   proximal vagina 24 gray   Radiation 10/10/2014 through 11/07/201610/04/2014 through 11/13/2014   Pelvis 45 gray    Vaginal cancer [facility]     Past Surgical History:  Procedure Laterality Date   COLPOSCOPY  08/30/14   and vaginal biopsy   IR PARACENTESIS  01/22/2022   IR PARACENTESIS  02/21/2022   ROBOTIC ASSISTED LAPAROSCOPIC HYSTERECTOMY AND SALPINGECTOMY  2012   Dr. Loni Muse, [place]    Prior to Admission medications   Medication Sig  Start Date End Date Taking? Authorizing Provider  acetaminophen (TYLENOL) 500 MG tablet Take 1,000 mg by mouth daily as needed for moderate pain, fever or headache.   Yes [provider]  furosemide (LASIX) 40 MG tablet Take 2 tablets (80 mg total) by mouth daily. Patient taking differently: Take 40 mg by mouth daily. 02/19/22  Yes Noralyn Pick, NP  omeprazole (PRILOSEC) 40 MG capsule Take 1 capsule (40 mg total) by mouth daily. Patient taking differently: Take 40 mg by mouth daily as needed (heartburn). 01/17/22  Yes Noralyn Pick, NP  potassium chloride SA (KLOR-CON M) 20 MEQ tablet Take 1 tablet (20 mEq total) by mouth daily. 03/03/22 04/02/22 Yes Noralyn Pick, NP  spironolactone (ALDACTONE) 100 MG tablet Take 2 tablets (200 mg total) by mouth daily. Patient taking differently: Take 100 mg by mouth daily. 02/19/22  Yes Noralyn Pick, NP    Current Facility-Administered Medications  Medication Dose Route Frequency Provider Last Rate Last Admin   [MAR Hold] 0.9 %  sodium chloride infusion (Manually program via Guardrails IV Fluids)   Intravenous Once Iona Coach, MD       0.9 %  sodium chloride infusion   Intravenous Continuous Brodi Nery, Laban Emperor, PA-C       [facility] Hold] albumin human 25 % solution 25 g  25 g Intravenous Once Noralyn Pick, NP       [facility] Hold] cefTRIAXone (ROCEPHIN) 1 g in sodium chloride 0.9 %  100 mL IVPB  1 g Intravenous Q24H Gaylan Gerold, DO       [facility] Hold] midodrine (PROAMATINE) tablet 15 mg  15 mg Oral TID WC Idamae Schuller, MD   15 mg at 03/20/22 0818   octreotide (SANDOSTATIN) 500 mcg in sodium chloride 0.9 % 250 mL (2 mcg/mL) infusion  50 mcg/hr Intravenous Continuous Gaylan Gerold, DO 25 mL/hr at 03/20/22 0220 50 mcg/hr at 03/20/22 0220   [MAR Hold] ondansetron (ZOFRAN) tablet 4 mg  4 mg Oral Q6H PRN Gaylan Gerold, DO       Or   [MAR Hold] ondansetron [facility]) injection 4 mg  4 mg Intravenous Q6H PRN Gaylan Gerold,  DO   4 mg at 03/20/22 0154   pantoprozole (PROTONIX) 80 mg /NS 100 mL infusion  8 mg/hr Intravenous Continuous Gaylan Gerold, DO 10 mL/hr at 03/20/22 [id number] 8 mg/hr at 03/20/22 [id number]    Allergies as of 03/19/2022 - Review Complete 03/19/2022  Allergen Reaction Noted   Amoxicillin  12/16/2021   Flagyl [metronidazole] Nausea Only 09/13/2014    Family History  Problem Relation Age of Onset   Colon cancer Mother    Heart attack Father    Pancreatic cancer Maternal Grandfather    Rectal cancer Neg Hx    Stomach cancer Neg Hx    Esophageal cancer Neg Hx     Social History   Socioeconomic History   Marital status: Single    Spouse name: Not on file   Number of children: 3   Years of education: Not on file   Highest education level: Not on file  Occupational History   Not on file  Tobacco Use   Smoking status: Former    Packs/day: .25    Types: Cigarettes    Quit date: 08/28/2014    Years since quitting: 7.5   Smokeless tobacco: Never  Vaping Use   Vaping Use: Never used  Substance and Sexual Activity   Alcohol use: Yes    Alcohol/week: 0.0 standard drinks of alcohol    Comment: occasional beer   Drug use: No   Sexual activity: Yes    Birth control/protection: Condom  Other Topics Concern   Not on file  Social History Narrative   Not on file   Social Determinants of Health   Financial Resource Strain: Not on file  Food Insecurity: No Food Insecurity (03/20/2022)   Hunger Vital Sign    Worried About Running Out of Food in the Last Year: Never true    Ran Out of Food in the Last Year: Never true  Transportation Needs: No Transportation Needs (03/20/2022)   PRAPARE - Hydrologist (Medical): No    Lack of Transportation (Non-Medical): No  Physical Activity: Not on file  Stress: Not on file  Social Connections: Not on file  Intimate Partner Violence: Not At Risk (03/20/2022)   Humiliation, Afraid, Rape, and Kick questionnaire    Fear of  Current or Ex-Partner: No    Emotionally Abused: No    Physically Abused: No    Sexually Abused: No    Review of Systems: ROS is O/W negative except as mentioned in HPI.  Physical Exam: Vital signs in last 24 hours: Temp:  [97.2 F (36.2 C)-98.7 F (37.1 C)] 98.5 F (36.9 C) (03/14 0728) Pulse Rate:  [56-143] 124 (03/14 0728) Resp:  [18-28] 22 (03/14 0728) BP: (93-130)/(60-89) 113/73 (03/14 0728) SpO2:  [93 %-100 %] 93 % (03/14 0728) Weight:  [71.7 kg]  71.7 kg (03/14 0500) Last BM Date : 03/19/22 General:  Alert, Well-developed, well-nourished, pleasant and cooperative in NAD Head:  Normocephalic and atraumatic. Eyes:  Sclera clear, no icterus.  Conjunctiva pink. Ears:  Normal auditory acuity. Mouth:  No deformity or lesions.   Lungs:  Clear throughout to auscultation.  No wheezes, crackles, or rhonchi.  Heart:  Tachy but regular rhythm; no murmurs, clicks, rubs, or gallops. Abdomen:  Soft, distended with ascites fluid.  BS present.  Non-tender.   Msk:  Symmetrical without gross deformities. Pulses:  Normal pulses noted. Extremities:  Some pitting edema noted in B/L LEs. Neurologic:  Alert and oriented x 4;  grossly normal neurologically. Skin:  Intact without significant lesions or rashes. Psych:  Alert and cooperative. Normal mood and affect.  Intake/Output from previous day: 03/13 0701 - 03/14 0700 In: 706.3 [I.V.:379.6; Blood:326.7] Out: 3 [Urine:1; Stool:2]  Lab Results: Recent Labs    03/19/22 1917 03/20/22 0401 03/20/22 0545  WBC 9.4 9.2 9.7  HGB 9.4* 11.1* 10.5*  HCT 28.3* 31.6* 30.7*  PLT 121* 118* 107*   BMET Recent Labs    03/19/22 1620 03/19/22 1629 03/20/22 0401  NA 134* 131* 131*  K 4.4 4.3 5.1  CL 101 103 103  CO2  --  18* 18*  GLUCOSE 121* 127* 100*  BUN 25* 19 25*  CREATININE 0.50 0.70 0.87  CALCIUM  --  7.9* 8.4*   LFT Recent Labs    03/20/22 0401  PROT 5.6*  ALBUMIN 2.2*  AST 314*  ALT 117*  ALKPHOS 105  BILITOT 6.2*    PT/INR Recent Labs    03/19/22 1535 03/20/22 0815  LABPROT 17.9* 21.5*  INR 1.5* 1.9*   Studies/Results: DG Chest Portable 1 View  Result Date: 03/19/2022 CLINICAL DATA:  Hematemesis.  Recent endoscopy. EXAM: PORTABLE CHEST 1 VIEW COMPARISON:  None Available. FINDINGS: The heart is normal in size. The mediastinal and hilar contours are normal. Minimal streaky basilar scarring changes versus subsegmental atelectasis. No pleural effusions or pulmonary infiltrates. The bony thorax is intact. IMPRESSION: 1. Minimal streaky basilar scarring changes versus subsegmental atelectasis. 2. No infiltrates or effusions. Electronically Signed   By: Marijo Sanes M.D.   On: 03/19/2022 15:52    IMPRESSION:  62 year old female with chronic hepatitis C genotype 1a with concomitant Hep B core total antibody positivity newly diagnosed with decompensated cirrhosis. Failed Hep C treatment with Interferon in the 1990's.  Hep C RNA quant 1,200,000. AFP 102.9. MR liver showed cirrhosis, no hepatoma. ANA positive. IgG 2,430. Normal SMA. S/P paracentesis 01/22/2022 (6L of peritoneal fluid removed). No evidence of SBP and cytology was negative for malignancy. No overt hepatic encephalopathy.    Hematemesis:  Hgb 10.5 grams down from 13.5-14.5 grams baseline.  This was after 2 units PRBCs.  Coagulopathy. INR 1.9  Thrombocytopenia:  Platelets 107  Hyponatremia:  Na+ 131  MELD 3.0: 26 at 03/20/2022  8:15 AM MELD-Na: 25 at 03/20/2022  8:15 AM Calculated from: Serum Creatinine: 0.87 mg/dL (Using min of 1 mg/dL) at 03/20/2022  4:01 AM Serum Sodium: 131 mmol/L at 03/20/2022  4:01 AM Total Bilirubin: 6.2 mg/dL at 03/20/2022  4:01 AM Serum Albumin: 2.2 g/dL at 03/20/2022  4:01 AM INR(ratio): 1.9 at 03/20/2022  8:15 AM Age at listing (hypothetical): 62 years Sex: Female at 03/20/2022  8:15 AM  PLAN: -Is on octreotide drip, PPI drip, Rocephin.  Continue those. -EGD later this morning. -May require TIPS procedure for both  varices and ascites. -  Trend labs. -Supposed to be seen by Thornton Papas at [facility] liver clinic.  Laban Emperor. Daxx Tiggs  03/20/2022, 10:23 AM

## 2022-03-20 NOTE — Transfer of Care (Signed)
Immediate Anesthesia Transfer of Care Note  Patient: Kathy Little  Procedure(s) Performed: ESOPHAGOGASTRODUODENOSCOPY (EGD) WITH PROPOFOL ESOPHAGEAL BANDING  Patient Location: PACU  Anesthesia Type:MAC  Level of Consciousness: drowsy, patient cooperative, and responds to stimulation  Airway & Oxygen Therapy: Patient Spontanous Breathing  Post-op Assessment: Report given to RN, Post -op Vital signs reviewed and stable, Patient moving all extremities X 4, and Patient able to stick tongue midline  Post vital signs: Reviewed  Last Vitals:  Vitals Value Taken Time  BP 138/64 03/20/22 1151  Temp 97.6   Pulse 118 03/20/22 1152  Resp 31 03/20/22 1152  SpO2 98 % 03/20/22 1152  Vitals shown include unvalidated device data.  Last Pain:  Vitals:   03/20/22 1023  TempSrc:   PainSc: 6          Complications: No notable events documented.

## 2022-03-20 NOTE — Progress Notes (Signed)
Echocardiogram 2D Echocardiogram has been performed.  Ronny Flurry 03/20/2022, 1:34 PM

## 2022-03-20 NOTE — H&P (Signed)
GASTROENTEROLOGY PROCEDURE H&P NOTE   Primary Care Physician: Noralyn Pick, NP  HPI: Kathy Little is a 62 y.o. female who presents for EGD for likely variceal bleeding with hematemesis and hematochezia.  Past Medical History:  Diagnosis Date   Atherosclerosis    Cirrhosis ([place])    Hepatitis C 1997   Hepatitis C    Hiatal hernia    History of brachytherapy 11/28/14, 12/05/14. 12/12/14   proximal vagina 24 gray   Radiation 10/10/2014 through 11/07/201610/04/2014 through 11/13/2014   Pelvis 45 gray    Vaginal cancer ([place])    Past Surgical History:  Procedure Laterality Date   COLPOSCOPY  08/30/14   and vaginal biopsy   IR PARACENTESIS  01/22/2022   IR PARACENTESIS  02/21/2022   ROBOTIC ASSISTED LAPAROSCOPIC HYSTERECTOMY AND SALPINGECTOMY  2012   Dr. Loni Muse, [place]   Current Facility-Administered Medications  Medication Dose Route Frequency Provider Last Rate Last Admin   [MAR Hold] 0.9 %  sodium chloride infusion (Manually program via Guardrails IV Fluids)   Intravenous Once Iona Coach, MD       0.9 %  sodium chloride infusion   Intravenous Continuous Zehr, Laban Emperor, PA-C       [MAR Hold] albumin human 25 % solution 25 g  25 g Intravenous Once Noralyn Pick, NP       [MAR Hold] cefTRIAXone (ROCEPHIN) 1 g in sodium chloride 0.9 % 100 mL IVPB  1 g Intravenous Q24H Gaylan Gerold, DO       lactated ringers infusion   Intravenous Continuous Mansouraty, Telford Nab., MD 10 mL/hr at 03/20/22 1101 Continued from Pre-op at 03/20/22 1101   [MAR Hold] midodrine (PROAMATINE) tablet 15 mg  15 mg Oral TID WC Idamae Schuller, MD   15 mg at 03/20/22 0818   octreotide (SANDOSTATIN) 500 mcg in sodium chloride 0.9 % 250 mL (2 mcg/mL) infusion  50 mcg/hr Intravenous Continuous Gaylan Gerold, DO 25 mL/hr at 03/20/22 0220 50 mcg/hr at 03/20/22 0220   [MAR Hold] ondansetron (ZOFRAN) tablet 4 mg  4 mg Oral Q6H PRN Gaylan Gerold, DO       Or   [MAR Hold] ondansetron (ZOFRAN) injection  4 mg  4 mg Intravenous Q6H PRN Gaylan Gerold, DO   4 mg at 03/20/22 0154   pantoprozole (PROTONIX) 80 mg /NS 100 mL infusion  8 mg/hr Intravenous Continuous Gaylan Gerold, DO 10 mL/hr at 03/20/22 0832 8 mg/hr at 03/20/22 [id number]    Current Facility-Administered Medications:    [MAR Hold] 0.9 %  sodium chloride infusion (Manually program via Guardrails IV Fluids), , Intravenous, Once, Iona Coach, MD   0.9 %  sodium chloride infusion, , Intravenous, Continuous, Zehr, Laban Emperor, PA-C   [MAR Hold] albumin human 25 % solution 25 g, 25 g, Intravenous, Once, Kennedy-Smith, Colleen M, NP   [MAR Hold] cefTRIAXone (ROCEPHIN) 1 g in sodium chloride 0.9 % 100 mL IVPB, 1 g, Intravenous, Q24H, Gaylan Gerold, DO   lactated ringers infusion, , Intravenous, Continuous, Mansouraty, Telford Nab., MD, Last Rate: 10 mL/hr at 03/20/22 1101, Continued from Pre-op at 03/20/22 1101   [MAR Hold] midodrine (PROAMATINE) tablet 15 mg, 15 mg, Oral, TID WC, Idamae Schuller, MD, 15 mg at 03/20/22 0818   [COMPLETED] octreotide (SANDOSTATIN) 2 mcg/mL load via infusion 50 mcg, 50 mcg, Intravenous, Once, 50 mcg at 03/19/22 1620 **AND** octreotide (SANDOSTATIN) 500 mcg in sodium chloride 0.9 % 250 mL (2 mcg/mL) infusion, 50 mcg/hr, Intravenous, Continuous, Gaylan Gerold, DO, Last Rate:  25 mL/hr at 03/20/22 0220, 50 mcg/hr at 03/20/22 0220   [MAR Hold] ondansetron (ZOFRAN) tablet 4 mg, 4 mg, Oral, Q6H PRN **OR** [MAR Hold] ondansetron (ZOFRAN) injection 4 mg, 4 mg, Intravenous, Q6H PRN, Gaylan Gerold, DO, 4 mg at 03/20/22 0154   pantoprozole (PROTONIX) 80 mg /NS 100 mL infusion, 8 mg/hr, Intravenous, Continuous, Gaylan Gerold, DO, Last Rate: 10 mL/hr at 03/20/22 0832, 8 mg/hr at 03/20/22 [id number] Allergies  Allergen Reactions   Amoxicillin    Flagyl [Metronidazole] Nausea Only   Family History  Problem Relation Age of Onset   Colon cancer Mother    Heart attack Father    Pancreatic cancer Maternal Grandfather    Rectal cancer Neg Hx     Stomach cancer Neg Hx    Esophageal cancer Neg Hx    Social History   Socioeconomic History   Marital status: Single    Spouse name: Not on file   Number of children: 3   Years of education: Not on file   Highest education level: Not on file  Occupational History   Not on file  Tobacco Use   Smoking status: Former    Packs/day: .25    Types: Cigarettes    Quit date: 08/28/2014    Years since quitting: 7.5   Smokeless tobacco: Never  Vaping Use   Vaping Use: Never used  Substance and Sexual Activity   Alcohol use: Yes    Alcohol/week: 0.0 standard drinks of alcohol    Comment: occasional beer   Drug use: No   Sexual activity: Yes    Birth control/protection: Condom  Other Topics Concern   Not on file  Social History Narrative   Not on file   Social Determinants of Health   Financial Resource Strain: Not on file  Food Insecurity: No Food Insecurity (03/20/2022)   Hunger Vital Sign    Worried About Running Out of Food in the Last Year: Never true    Ran Out of Food in the Last Year: Never true  Transportation Needs: No Transportation Needs (03/20/2022)   PRAPARE - Hydrologist (Medical): No    Lack of Transportation (Non-Medical): No  Physical Activity: Not on file  Stress: Not on file  Social Connections: Not on file  Intimate Partner Violence: Not At Risk (03/20/2022)   Humiliation, Afraid, Rape, and Kick questionnaire    Fear of Current or Ex-Partner: No    Emotionally Abused: No    Physically Abused: No    Sexually Abused: No    Physical Exam: Today's Vitals   03/20/22 0308 03/20/22 0500 03/20/22 0728 03/20/22 1023  BP: 118/87  113/73 (!) 106/58  Pulse: (!) 132  (!) 124 (!) 111  Resp: 20  (!) 22 (!) 25  Temp: 98.3 F (36.8 C)  98.5 F (36.9 C) 98.3 F (36.8 C)  TempSrc: Oral  Oral   SpO2: 95%  93% 95%  Weight:  71.7 kg  71.7 kg  Height:    '5\' 7"'$  (1.702 m)  PainSc:    6    Body mass index is 24.76 kg/m. GEN: NAD EYE:  Sclerae anicteric ENT: MMM CV: Non-tachycardic GI: Soft, NT/ND NEURO:  Alert & Oriented x 3  Lab Results: Recent Labs    03/19/22 1917 03/20/22 0401 03/20/22 0545  WBC 9.4 9.2 9.7  HGB 9.4* 11.1* 10.5*  HCT 28.3* 31.6* 30.7*  PLT 121* 118* 107*   BMET Recent Labs    03/19/22 1620  03/19/22 1629 03/20/22 0401  NA 134* 131* 131*  K 4.4 4.3 5.1  CL 101 103 103  CO2  --  18* 18*  GLUCOSE 121* 127* 100*  BUN 25* 19 25*  CREATININE 0.50 0.70 0.87  CALCIUM  --  7.9* 8.4*   LFT Recent Labs    03/20/22 0401  PROT 5.6*  ALBUMIN 2.2*  AST 314*  ALT 117*  ALKPHOS 105  BILITOT 6.2*   PT/INR Recent Labs    03/19/22 1535 03/20/22 0815  LABPROT 17.9* 21.5*  INR 1.5* 1.9*     Impression / Plan: This is a 62 y.o.female who presents for EGD for likely variceal bleeding with hematemesis and hematochezia.  The risks and benefits of endoscopic evaluation/treatment were discussed with the patient and/or family; these include but are not limited to the risk of perforation, infection, bleeding, missed lesions, lack of diagnosis, severe illness requiring hospitalization, as well as anesthesia and sedation related illnesses.  The patient's history has been reviewed, patient examined, no change in status, and deemed stable for procedure.  The patient and/or family is agreeable to proceed.    The patient is aware that should esophageal variceal band ligation not be successful and she were to need an emergent TIPS procedure that she is in agreement with this.  We are trying to plan for her to be evaluated by interventional radiology but in the setting of her acute bleeding will need to proceed with band ligation attempt.  If her echocardiogram is reasonable, we likely will ask whether an early TIPS for this patient may help decrease her issues with ascites as well as now what looks to be variceal bleed risk.  If she is unable to give any consent because she were to need emergency  intubation, she is comfortable with [place] talking with her son and daughter.   Justice Britain, MD [facility] # [phone]

## 2022-03-21 ENCOUNTER — Inpatient Hospital Stay (HOSPITAL_COMMUNITY): Payer: Medicaid Other

## 2022-03-21 DIAGNOSIS — R188 Other ascites: Secondary | ICD-10-CM

## 2022-03-21 DIAGNOSIS — D689 Coagulation defect, unspecified: Secondary | ICD-10-CM

## 2022-03-21 DIAGNOSIS — D696 Thrombocytopenia, unspecified: Secondary | ICD-10-CM

## 2022-03-21 DIAGNOSIS — K922 Gastrointestinal hemorrhage, unspecified: Principal | ICD-10-CM

## 2022-03-21 DIAGNOSIS — I8511 Secondary esophageal varices with bleeding: Secondary | ICD-10-CM | POA: Insufficient documentation

## 2022-03-21 DIAGNOSIS — E871 Hypo-osmolality and hyponatremia: Secondary | ICD-10-CM

## 2022-03-21 DIAGNOSIS — K652 Spontaneous bacterial peritonitis: Secondary | ICD-10-CM

## 2022-03-21 HISTORY — PX: IR PARACENTESIS: IMG2679

## 2022-03-21 LAB — COMPREHENSIVE METABOLIC PANEL
ALT: 232 U/L — ABNORMAL HIGH (ref 0–44)
ALT: 215 U/L — ABNORMAL HIGH (ref 0–44)
ALT: 170 U/L — ABNORMAL HIGH (ref 0–44)
AST: 583 U/L — ABNORMAL HIGH (ref 15–41)
AST: 485 U/L — ABNORMAL HIGH (ref 15–41)
AST: 387 U/L — ABNORMAL HIGH (ref 15–41)
Albumin: 2 g/dL — ABNORMAL LOW (ref 3.5–5.0)
Albumin: 2 g/dL — ABNORMAL LOW (ref 3.5–5.0)
Albumin: 2.8 g/dL — ABNORMAL LOW (ref 3.5–5.0)
Alkaline Phosphatase: 103 U/L (ref 38–126)
Alkaline Phosphatase: 91 U/L (ref 38–126)
Alkaline Phosphatase: 80 U/L (ref 38–126)
Anion gap: 9 (ref 5–15)
Anion gap: 3 — ABNORMAL LOW (ref 5–15)
Anion gap: 6 (ref 5–15)
BUN: 37 mg/dL — ABNORMAL HIGH (ref 8–23)
BUN: 33 mg/dL — ABNORMAL HIGH (ref 8–23)
BUN: 29 mg/dL — ABNORMAL HIGH (ref 8–23)
CO2: 17 mmol/L — ABNORMAL LOW (ref 22–32)
CO2: 22 mmol/L (ref 22–32)
CO2: 21 mmol/L — ABNORMAL LOW (ref 22–32)
Calcium: 8.3 mg/dL — ABNORMAL LOW (ref 8.9–10.3)
Calcium: 8.3 mg/dL — ABNORMAL LOW (ref 8.9–10.3)
Calcium: 8.4 mg/dL — ABNORMAL LOW (ref 8.9–10.3)
Chloride: 102 mmol/L (ref 98–111)
Chloride: 104 mmol/L (ref 98–111)
Chloride: 104 mmol/L (ref 98–111)
Creatinine, Ser: 1.01 mg/dL — ABNORMAL HIGH (ref 0.44–1.00)
Creatinine, Ser: 0.98 mg/dL (ref 0.44–1.00)
Creatinine, Ser: 0.95 mg/dL (ref 0.44–1.00)
GFR, Estimated: >60 mL/min (ref >60)
GFR, Estimated: >60 mL/min (ref >60)
GFR, Estimated: >60 mL/min (ref >60)
Glucose, Bld: 106 mg/dL — ABNORMAL HIGH (ref 70–99)
Glucose, Bld: 89 mg/dL (ref 70–99)
Glucose, Bld: 128 mg/dL — ABNORMAL HIGH (ref 70–99)
Potassium: 4.7 mmol/L (ref 3.5–5.1)
Potassium: 4.5 mmol/L (ref 3.5–5.1)
Potassium: 3.9 mmol/L (ref 3.5–5.1)
Sodium: 128 mmol/L — ABNORMAL LOW (ref 135–145)
Sodium: 129 mmol/L — ABNORMAL LOW (ref 135–145)
Sodium: 131 mmol/L — ABNORMAL LOW (ref 135–145)
Total Bilirubin: 6.6 mg/dL — ABNORMAL HIGH (ref 0.3–1.2)
Total Bilirubin: 6.8 mg/dL — ABNORMAL HIGH (ref 0.3–1.2)
Total Bilirubin: 5.9 mg/dL — ABNORMAL HIGH (ref 0.3–1.2)
Total Protein: 5.3 g/dL — ABNORMAL LOW (ref 6.5–8.1)
Total Protein: 5.1 g/dL — ABNORMAL LOW (ref 6.5–8.1)
Total Protein: 5.2 g/dL — ABNORMAL LOW (ref 6.5–8.1)

## 2022-03-21 LAB — PROTIME-INR
INR: 1.8 — ABNORMAL HIGH (ref 0.8–1.2)
Prothrombin Time: 20.9 seconds — ABNORMAL HIGH (ref 11.4–15.2)

## 2022-03-21 LAB — ALBUMIN, PLEURAL OR PERITONEAL FLUID: Albumin, Fluid: <1.5 g/dL

## 2022-03-21 LAB — CBC
HCT: 27.4 % — ABNORMAL LOW (ref 36.0–46.0)
HCT: 23.8 % — ABNORMAL LOW (ref 36.0–46.0)
Hemoglobin: 9.2 g/dL — ABNORMAL LOW (ref 12.0–15.0)
Hemoglobin: 8.1 g/dL — ABNORMAL LOW (ref 12.0–15.0)
MCH: 31.7 pg (ref 26.0–34.0)
MCH: 32.3 pg (ref 26.0–34.0)
MCHC: 33.6 g/dL (ref 30.0–36.0)
MCHC: 34 g/dL (ref 30.0–36.0)
MCV: 94.5 fL (ref 80.0–100.0)
MCV: 94.8 fL (ref 80.0–100.0)
Platelets: 101 10*3/uL — ABNORMAL LOW (ref 150–400)
Platelets: 77 10*3/uL — ABNORMAL LOW (ref 150–400)
RBC: 2.9 MIL/uL — ABNORMAL LOW (ref 3.87–5.11)
RBC: 2.51 MIL/uL — ABNORMAL LOW (ref 3.87–5.11)
RDW: 21.5 % — ABNORMAL HIGH (ref 11.5–15.5)
RDW: 21.2 % — ABNORMAL HIGH (ref 11.5–15.5)
WBC: 9.2 10*3/uL (ref 4.0–10.5)
WBC: 4.2 10*3/uL (ref 4.0–10.5)
nRBC: 0.7 % — ABNORMAL HIGH (ref 0.0–0.2)
nRBC: 0.7 % — ABNORMAL HIGH (ref 0.0–0.2)

## 2022-03-21 LAB — BODY FLUID CELL COUNT WITH DIFFERENTIAL
Eos, Fluid: 0 %
Lymphs, Fluid: 1 %
Monocyte-Macrophage-Serous Fluid: 21 % — ABNORMAL LOW (ref 50–90)
Neutrophil Count, Fluid: 78 % — ABNORMAL HIGH (ref 0–25)
Total Nucleated Cell Count, Fluid: 1071 cu mm — ABNORMAL HIGH (ref 0–1000)

## 2022-03-21 LAB — PROTEIN, PLEURAL OR PERITONEAL FLUID: Total protein, fluid: <3 g/dL

## 2022-03-21 LAB — BILIRUBIN, DIRECT: Bilirubin, Direct: 3.4 mg/dL — ABNORMAL HIGH (ref 0.0–0.2)

## 2022-03-21 LAB — AMMONIA: Ammonia: 52 umol/L — ABNORMAL HIGH (ref 9–35)

## 2022-03-21 MED ORDER — ALBUMIN HUMAN 25 % IV SOLN
50.0000 g | Freq: Four times a day (QID) | INTRAVENOUS | Status: DC
  Administered 2022-03-21: 50 g via INTRAVENOUS
  Filled 2022-03-21: qty 200

## 2022-03-21 MED ORDER — ALBUMIN HUMAN 25 % IV SOLN: 25.0000 g | Freq: Once | INTRAVENOUS | Status: DC

## 2022-03-21 MED ORDER — LACTULOSE 10 GM/15ML PO SOLN
10.0000 g | Freq: Three times a day (TID) | ORAL | Status: DC
  Administered 2022-03-21 – 2022-03-22 (×5): 10 g via ORAL
  Filled 2022-03-21 (×5): qty 30

## 2022-03-21 MED ORDER — LIDOCAINE HCL 1 % IJ SOLN
INTRAMUSCULAR | Status: AC
  Administered 2022-03-21: 7 mL
  Filled 2022-03-21: qty 20

## 2022-03-21 MED ORDER — ALBUMIN HUMAN 25 % IV SOLN: 50.0000 g | Freq: Once | INTRAVENOUS | Status: DC

## 2022-03-21 MED ORDER — IOHEXOL 350 MG/ML SOLN
100.0000 mL | Freq: Once | INTRAVENOUS | Status: AC | PRN
  Administered 2022-03-21: 100 mL via INTRAVENOUS

## 2022-03-21 MED ORDER — ALBUMIN HUMAN 25 % IV SOLN
25.0000 g | Freq: Four times a day (QID) | INTRAVENOUS | Status: AC
  Administered 2022-03-21 – 2022-03-22 (×2): 25 g via INTRAVENOUS
  Filled 2022-03-21 (×2): qty 100

## 2022-03-21 MED ORDER — ALBUMIN HUMAN 25 % IV SOLN
25.0000 g | Freq: Three times a day (TID) | INTRAVENOUS | Status: AC
  Administered 2022-03-23: 25 g via INTRAVENOUS
  Administered 2022-03-23 (×2): 12.5 g via INTRAVENOUS
  Filled 2022-03-21 (×2): qty 100

## 2022-03-21 NOTE — Progress Notes (Signed)
   Subjective: Patient reports having diarrhea overnight, multiple episodes blood. No n/v. No abdominal pain after paracentesis. No CP/SOB. Eating well. Alert oriented to person, place, not time. Oriented to situation.  She feels like she is really confused. Couldn't think straight or remember things.   Objective:  Vital signs in last 24 hours: Vitals:   03/21/22 0347 03/21/22 0500 03/21/22 0746 03/21/22 0825  BP: (!) 151/79  (!) 98/58 112/64  Pulse:   76   Resp:   (!) 21 19  Temp: 97.7 F (36.5 C)  (!) 97.4 F (36.3 C)   TempSrc: Oral  Oral   SpO2:   97%   Weight:  70.4 kg    Height:      Physical Exam: [id number] ill appearing, no acute distress, laying in bed cooperatively HEENT:scleral icterus Pulm:normal wob, LCTAB [id number], regular rhythm, no m/r/g, 2+ bilateral radial and DP pulses Abd/GI:soft, non-distended, no ptp, no dullness to percussion Extremities/skin: warm, 2+ LE edema bilaterally just above the ankles,jaundiced Neuro: alert and oriented to person/place/situation but not time, mild asterixis  Assessment/Plan:  Principal Problem:   Esophageal varices with bleeding (HCC) Active Problems:   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 Baires is a 62 y/o female with a pmh of cirrhosis 2/2 to untreated HCV, esophageal varices who presented with bleed esophageal varices now s/p banding and LVP. Developed encephalopathy today.   Decompensated Cirrhosis 2/2 HCVHepatic encephalopathy Esophageal varices UGIB c/b acute blood loss anemia EGD yesterday with 5 bands placed and no bleeding from varices at conclusion of the procedure. Hgb stable at 9.2, same as yesterday. NA 128, cr 1.01, AST/ALT increasing to 583/232, total bili increasing 6.2 to 6.6, d bili 3.4, INR stable at 1.8. Overall worsening picture of liver disease on labs. S/p LVP with 8 L removed today and 50g albumin. Will follow up CMP  later today.HDS on midodrine 15 TID,will continue given LVP today and plan to wean starting tomorrow. No elective TIPS per IR at this time. CT BRTO protocol for further eval, ordered per GI. Will need to transition octreotide to beta blocker, protonix infusion to IV/PO option. Will continue ceftriaxone for empiric SBP treatment and follow labs.Alert oriented to person, place, not time. Oriented to situation. She feels like she is really confused. Couldn't think straight or remember things. Will start treatment for hepatic encephalopathy -IV ceftriaxone,Protonix infusion,IV octreotide -S/p LVP, 50g albumin q6 hrs -lactulose 10g TID, titrate to 3 BMs -midodrine 15 TID, try to wean tomorrow -F/u CMP 5PM -Regular diet -GI following,F/u recs   Hyponatremia elevated creatinine Sodium from 131 to 128, creatinine from 0.87 to 1.01. Suspect this is in the setting of ascites, total body fluid up and intravascularly down. No urine studies at this time, suspect LVP and albumin will correct. -CMP 5AM + 5PM daily   Prior to Admission Living Arrangement: Anticipated Discharge Location: Barriers to Discharge: Dispo: Anticipated discharge in approximately 2 day(s).   Iona Coach, MD 03/21/2022, 1:06 PM Pager: Iona Coach, MD Internal Medicine Resident, PGY-1 [facility]  Pager: [phone] After 5pm on weekdays and 1pm on weekends: On Call pager [phone]

## 2022-03-21 NOTE — Progress Notes (Signed)
[facility] Progress Note  CC:  Hematemesis, cirrhosis  Subjective: No further sign of bleeding.  Says that she feels much better since EGD and paracentesis.  Feels a little foggy headed.  Objective:  Vital signs in last 24 hours: Temp:  [97.4 F (36.3 C)-98.3 F (36.8 C)] 97.4 F (36.3 C) (03/15 0746) Pulse Rate:  [76-110] 76 (03/15 0746) Resp:  [19-24] 19 (03/15 0825) BP: (98-151)/(57-79) 112/64 (03/15 0825) SpO2:  [94 %-97 %] 97 % (03/15 0746) Weight:  [70.4 kg] 70.4 kg (03/15 0500) Last BM Date : 03/20/22 General:  Alert, chronically ill-appearing, in NAD Heart:  Regular rate and rhythm; no murmurs Pulm:  CTAB.  No W/R/R. Abdomen:  Softly distended.  BS present.  Non-tender.    Extremities:  Without edema. Neurologic:  Alert and oriented x 4; maybe slight asterixis on exam.  Intake/Output from previous day: 03/14 0701 - 03/15 0700 In: 1482.2 [P.O.:120; I.V.:1241.9; IV Piggyback:120.3] Out: -   Lab Results: Recent Labs    03/20/22 0545 03/20/22 1849 03/21/22 0545  WBC 9.7 9.8 9.2  HGB 10.5* 9.2* 9.2*  HCT 30.7* 26.3* 27.4*  PLT 107* 117* 101*   BMET Recent Labs    03/19/22 1629 03/20/22 0401 03/21/22 0545  NA 131* 131* 128*  K 4.3 5.1 4.7  CL 103 103 102  CO2 18* 18* 17*  GLUCOSE 127* 100* 106*  BUN 19 25* 37*  CREATININE 0.70 0.87 1.01*  CALCIUM 7.9* 8.4* 8.3*   LFT Recent Labs    03/21/22 0545  PROT 5.3*  ALBUMIN 2.0*  AST 583*  ALT 232*  ALKPHOS 103  BILITOT 6.6*  BILIDIR 3.4*   PT/INR Recent Labs    03/20/22 0815 03/21/22 0545  LABPROT 21.5* 20.9*  INR 1.9* 1.8*   IR Paracentesis  Result Date: 03/21/2022 INDICATION: Patient history of hepatitis-C, decompensated cirrhosis, with esophageal varices and ascites. Request is for therapeutic and diagnostic paracentesis EXAM: ULTRASOUND GUIDED THERAPEUTIC AND DIAGNOSTIC PARACENTESIS MEDICATIONS: LIDOCAINE 1% 10 ML COMPLICATIONS: None immediate. PROCEDURE: Informed  written consent was obtained from the patient after a discussion of the risks, benefits and alternatives to treatment. A timeout was performed prior to the initiation of the procedure. Initial ultrasound scanning demonstrates a large amount of ascites within the right lower abdominal quadrant. The right lower abdomen was prepped and draped in the usual sterile fashion. 1% lidocaine was used for local anesthesia. Following this, a 19 gauge, 7-cm, Yueh catheter was introduced. An ultrasound image was saved for documentation purposes. The paracentesis was performed. The catheter was removed and a dressing was applied. The patient tolerated the procedure well without immediate post procedural complication. Patient received post-procedure intravenous albumin; see nursing notes for details. FINDINGS: A total of approximately 8 L of straw-colored fluid was removed. Samples were sent to the laboratory as requested by the clinical team. IMPRESSION: Successful ultrasound-guided paracentesis yielding 8 liters of peritoneal fluid. Read by: Rushie Nyhan, NP PLAN: If the patient eventually requires >/=2 paracenteses in a 30 day period, candidacy for formal evaluation by the [facility] will be assessed. Electronically Signed   By: Markus Daft M.D.   On: 03/21/2022 11:22   ECHOCARDIOGRAM COMPLETE  Result Date: 03/20/2022    ECHOCARDIOGRAM REPORT   Patient Name:   SHANNELL MARICLE Date of Exam: 03/20/2022 Medical Rec #:  [id number]      Height:       67.0 in Accession #:    [id number]  Weight:       158.1 lb Date of Birth:  Feb 23, 1960      BSA:          1.830 m Patient Age:    62 years       BP:           113/73 mmHg Patient Gender: F              HR:           110 bpm. Exam Location:  Inpatient Procedure: 2D Echo, Cardiac Doppler and Color Doppler Indications:    Abnormal ECG R94.31  History:        Patient has no prior history of Echocardiogram examinations.                  Risk Factors:Current Smoker.  Sonographer:    Ronny Flurry Referring Phys: [id number] [place]  1. Left ventricular ejection fraction, by estimation, is 45 to 50%. The left ventricle has mildly decreased function. The left ventricle demonstrates global hypokinesis. There is mild concentric left ventricular hypertrophy. Left ventricular diastolic parameters are indeterminate.  2. Right ventricular systolic function is normal. The right ventricular size is normal.  3. The mitral valve is normal in structure. Trivial mitral valve regurgitation. No evidence of mitral stenosis.  4. The aortic valve is tricuspid. Aortic valve regurgitation is trivial. Aortic valve sclerosis/calcification is present, without any evidence of aortic stenosis.  5. Aortic dilatation noted. There is mild dilatation of the ascending aorta, measuring 39 mm. Comparison(s): No prior Echocardiogram. FINDINGS  Left Ventricle: Left ventricular ejection fraction, by estimation, is 45 to 50%. The left ventricle has mildly decreased function. The left ventricle demonstrates global hypokinesis. The left ventricular internal cavity size was normal in size. There is  mild concentric left ventricular hypertrophy. Left ventricular diastolic parameters are indeterminate. Right Ventricle: The right ventricular size is normal. Right ventricular systolic function is normal. Left Atrium: Left atrial size was normal in size. Right Atrium: Right atrial size was normal in size. Pericardium: Trivial pericardial effusion is present. Mitral Valve: The mitral valve is normal in structure. Trivial mitral valve regurgitation. No evidence of mitral valve stenosis. Tricuspid Valve: The tricuspid valve is normal in structure. Tricuspid valve regurgitation is trivial. No evidence of tricuspid stenosis. Aortic Valve: The aortic valve is tricuspid. Aortic valve regurgitation is trivial. Aortic valve sclerosis/calcification is present, without any  evidence of aortic stenosis. Aortic valve mean gradient measures 6.5 mmHg. Aortic valve peak gradient measures 10.9 mmHg. Aortic valve area, by VTI measures 2.07 cm. Pulmonic Valve: The pulmonic valve was normal in structure. Pulmonic valve regurgitation is not visualized. No evidence of pulmonic stenosis. Aorta: Aortic dilatation noted. There is mild dilatation of the ascending aorta, measuring 39 mm. Venous: The inferior vena cava was not well visualized. IAS/Shunts: No atrial level shunt detected by color flow Doppler.  LEFT VENTRICLE PLAX 2D LVIDd:         4.60 cm   Diastology LVIDs:         3.00 cm   LV e' medial:    6.99 cm/s LV PW:         1.10 cm   LV E/e' medial:  8.0 LV IVS:        1.20 cm   LV e' lateral:   11.80 cm/s LVOT diam:     1.90 cm   LV E/e' lateral: 4.7 LV SV:         48  LV SV Index:   26 LVOT Area:     2.84 cm  RIGHT VENTRICLE RV S prime:     20.00 cm/s TAPSE (M-mode): 2.4 cm LEFT ATRIUM             Index        RIGHT ATRIUM           Index LA diam:        3.50 cm 1.91 cm/m   RA Area:     13.20 cm LA Vol (A2C):   56.2 ml 30.69 ml/m  RA Volume:   25.70 ml  14.05 ml/m LA Vol (A4C):   17.0 ml 9.29 ml/m LA Biplane Vol: 28.4 ml 15.52 ml/m  AORTIC VALVE AV Area (Vmax):    2.23 cm AV Area (Vmean):   2.04 cm AV Area (VTI):     2.07 cm AV Vmax:           165.00 cm/s AV Vmean:          116.500 cm/s AV VTI:            0.234 m AV Peak Grad:      10.9 mmHg AV Mean Grad:      6.5 mmHg LVOT Vmax:         130.00 cm/s LVOT Vmean:        83.633 cm/s LVOT VTI:          0.171 m LVOT/AV VTI ratio: 0.73  AORTA Ao Root diam: 3.50 cm Ao Asc diam:  3.90 cm MITRAL VALVE                TRICUSPID VALVE MV Area (PHT): 7.59 cm     TR Peak grad:   18.7 mmHg MV Decel Time: 100 msec     TR Vmax:        216.00 cm/s MV E velocity: 55.70 cm/s MV A velocity: 121.00 cm/s  SHUNTS MV E/A ratio:  0.46         Systemic VTI:  0.17 m                             Systemic Diam: 1.90 cm Kirk Ruths MD Electronically signed by  Kirk Ruths MD Signature Date/Time: 03/20/2022/3:12:18 PM    Final    DG Chest Portable 1 View  Result Date: 03/19/2022 CLINICAL DATA:  Hematemesis.  Recent endoscopy. EXAM: PORTABLE CHEST 1 VIEW COMPARISON:  None Available. FINDINGS: The heart is normal in size. The mediastinal and hilar contours are normal. Minimal streaky basilar scarring changes versus subsegmental atelectasis. No pleural effusions or pulmonary infiltrates. The bony thorax is intact. IMPRESSION: 1. Minimal streaky basilar scarring changes versus subsegmental atelectasis. 2. No infiltrates or effusions. Electronically Signed   By: Marijo Sanes M.D.   On: 03/19/2022 15:52    Assessment / Plan: 62 year old female with chronic hepatitis C genotype 1a with concomitant Hep B core total antibody positivity newly diagnosed with decompensated cirrhosis. Failed Hep C treatment with Interferon in the 1990's.  Hep C RNA quant 1,200,000. AFP 102.9. MR liver showed cirrhosis, no hepatoma. ANA positive. IgG 2,430. Normal SMA. S/P paracentesis 01/22/2022 (6L of peritoneal fluid removed). No evidence of SBP and cytology was negative for malignancy. No overt hepatic encephalopathy, but nurse reports maybe some mild confusion today.  Ammonia level is 52.  Had another paracentesis today with 8 Liters removed.  Cell count from today c/w SBP.  Is already on Rocephin.  Hematemesis:  Hgb 10.5 grams down from 13.5-14.5 grams baseline.  This was after 2 units PRBCs.  Hgb this AM stable at 9.2 grams.  EGD 3/14: - No gross lesions in the proximal esophagus. - Grade III esophageal varices oozing blood. Completely eradicated. Banded. - Z-line irregular, 35 cm from the incisors. - 2 cm hiatal hernia. - Clotted blood in the cardia, in the gastric fundus and in the gastric body. - Portal hypertensive gastropathy. - No gross lesions in the duodenal bulb, in the first portion of the duodenum and in the second portion of the duodenum.  Not deemed to be candidate  for elective TIPS at this time per IR.  They recommended continuing to trend her labs and MELD score and order CT scan BRTO for reassessment by them early next week.   Coagulopathy. INR 1.8   Thrombocytopenia:  Platelets 101   Hyponatremia:  Na+ 131  MELD 3.0: 27 at 03/21/2022  5:45 AM MELD-Na: 26 at 03/21/2022  5:45 AM Calculated from: Serum Creatinine: 1.01 mg/dL at 03/21/2022  5:45 AM Serum Sodium: 128 mmol/L at 03/21/2022  5:45 AM Total Bilirubin: 6.6 mg/dL at 03/21/2022  5:45 AM Serum Albumin: 2.0 g/dL at 03/21/2022  5:45 AM INR(ratio): 1.8 at 03/21/2022  5:45 AM Age at listing (hypothetical): 62 years Sex: Female at 03/21/2022  5:45 AM   -PPI gtt for 72 hours from admission. -Octreotide gtt for 72 hours from admission. -Continue Rocephin. -Carafate slurry BID. -CT BRTO per request of IR. -Lactulose 10 grams TID has been ordered.  Titrate to 2-3 soft stools daily. -Will need another diagnostic paracentesis in 2-3 days to be sure cell count is trending down. -Albumin 50 grams has been given today and should receive a total of 100 grams today and another 75 grams on 3/17.  I have relayed this to the medicine team.   LOS: 2 days   Laban Emperor. Wylee Dorantes  03/21/2022, 1:15 PM

## 2022-03-21 NOTE — Consult Note (Addendum)
Chief Complaint: Patient was seen in consultation today for decompensated cirrhosis/ TIPS Chief Complaint  Patient presents with   Abdominal Pain   Hematemesis   at the request of Mansouraty, Valarie Merino   Referring Physician(s): Mansouraty, Valarie Merino   Supervising Physician: Markus Daft  Patient Status: [facility] - In-pt  History of Present Illness: Kathy Little is a 62 y.o. female with PMHs of with a past medical history of recurrent cervical cancer 10+  years ago, hep C with recent diagnosis of  decompensated cirrhosis in Jan 2024, bleeding esophageal varices s/p banding on 3/14 who was referred to IR for possible TIPS.   Patient developed generalized abdominal pain with anasarca, CT AP with was obtained on 01/17/22 which showed development of advanced hepatic cirrhosis with large amount of ascites and diffuse soft tissue edema w/o  focal hepatic lesion. MR liver on 2/12 showed morphologic features of the liver compatible with cirrhosis w/o hepatic lesion, stigmata of portal venous hypertension including splenomegaly, esophageal, gastric and splenic varices and large volume of ascites.  Patient underwent paracentesis three times since January which were all large volume over 5 L. Patient underwent EGD at the outpatient center on 3/11 which showed esophageal varices, banding could not be performed as the procedure was performed at outpatient [facility]. Patient developed hematemesis on the night of 3/12, she came to ED and admitted, GI was consulted and now she is s/p repeat EGD on 3/14  which showed oozing from eso varices, 5 band were placed.  IR was requested for possible TIPS.   MELD  - 3/13: 16 - 3/14: 21 - due to sudden increase in T bili, 3.7 to 6.2 - 3/15: 20  MELD- Na  - 3/13: 21 - 3/14: 25 - 3/15: 26   Child Pugh C, FIPS 1.11  Patient seen with Dr. Anselm Pancoast while she was brought down for paracentesis.  She is laying in bed, NAD. Appears to be tired.  States that she feels much  better after the paracentesis.   Benefit and risks of TIPS discussed thoroughly with the patient.  She was informed that she is currently not a good candidate for elective TIPS due to high bilirubin and hx of encephalopathy. She will certainly be a candidate for emergent TIPS if she were to rebleed and becomes hemodynamically unstable, but she will still remain as high risk for post procedure complications.  She verbalized understanding.  Case was also reviewed with Dr. Serafina Royals who agrees patient is currently not optimal candidate for elective TIPS, recommend trending LFT/MELD and CT BRTO protocol over the weekend to further evaluate candidacy of elective TIPS.  GI notified.   Past Medical History:  Diagnosis Date   Atherosclerosis    Cirrhosis ([place])    Hepatitis C 1997   Hepatitis C    Hiatal hernia    History of brachytherapy 11/28/14, 12/05/14. 12/12/14   proximal vagina 24 gray   Radiation 10/10/2014 through 11/07/201610/04/2014 through 11/13/2014   Pelvis 45 gray    Vaginal cancer [facility])     Past Surgical History:  Procedure Laterality Date   COLPOSCOPY  08/30/14   and vaginal biopsy   IR PARACENTESIS  01/22/2022   IR PARACENTESIS  02/21/2022   ROBOTIC ASSISTED LAPAROSCOPIC HYSTERECTOMY AND SALPINGECTOMY  2012   Dr. Loni Muse, [place]    Allergies: Amoxicillin and Flagyl [metronidazole]  Medications: Prior to Admission medications   Medication Sig Start Date End Date Taking? Authorizing Provider  acetaminophen (TYLENOL) 500 MG tablet Take 1,000 mg by mouth  daily as needed for moderate pain, fever or headache.   Yes [provider]  furosemide (LASIX) 40 MG tablet Take 2 tablets (80 mg total) by mouth daily. Patient taking differently: Take 40 mg by mouth daily. 02/19/22  Yes Noralyn Pick, NP  omeprazole (PRILOSEC) 40 MG capsule Take 1 capsule (40 mg total) by mouth daily. Patient taking differently: Take 40 mg by mouth daily as needed (heartburn). 01/17/22  Yes  Noralyn Pick, NP  potassium chloride SA (KLOR-CON M) 20 MEQ tablet Take 1 tablet (20 mEq total) by mouth daily. 03/03/22 04/02/22 Yes Noralyn Pick, NP  spironolactone (ALDACTONE) 100 MG tablet Take 2 tablets (200 mg total) by mouth daily. Patient taking differently: Take 100 mg by mouth daily. 02/19/22  Yes Noralyn Pick, NP     Family History  Problem Relation Age of Onset   Colon cancer Mother    Heart attack Father    Pancreatic cancer Maternal Grandfather    Rectal cancer Neg Hx    Stomach cancer Neg Hx    Esophageal cancer Neg Hx     Social History   Socioeconomic History   Marital status: Single    Spouse name: Not on file   Number of children: 3   Years of education: Not on file   Highest education level: Not on file  Occupational History   Not on file  Tobacco Use   Smoking status: Former    Packs/day: .25    Types: Cigarettes    Quit date: 08/28/2014    Years since quitting: 7.5   Smokeless tobacco: Never  Vaping Use   Vaping Use: Never used  Substance and Sexual Activity   Alcohol use: Yes    Alcohol/week: 0.0 standard drinks of alcohol    Comment: occasional beer   Drug use: No   Sexual activity: Yes    Birth control/protection: Condom  Other Topics Concern   Not on file  Social History Narrative   Not on file   Social Determinants of Health   Financial Resource Strain: Not on file  Food Insecurity: No Food Insecurity (03/20/2022)   Hunger Vital Sign    Worried About Running Out of Food in the Last Year: Never true    Ran Out of Food in the Last Year: Never true  Transportation Needs: No Transportation Needs (03/20/2022)   PRAPARE - Hydrologist (Medical): No    Lack of Transportation (Non-Medical): No  Physical Activity: Not on file  Stress: Not on file  Social Connections: Not on file     Review of Systems: A 12 point ROS discussed and pertinent positives are indicated in the HPI  above.  All other systems are negative.  Vital Signs: BP 112/64   Pulse 76   Temp (!) 97.4 F (36.3 C) (Oral)   Resp 19   Ht 5\' 7"  (1.702 m)   Wt 155 lb 3.3 oz (70.4 kg)   SpO2 97%   BMI 24.31 kg/m    Physical Exam Vitals reviewed.  Constitutional:      General: She is not in acute distress.    Appearance: She is ill-appearing.  HENT:     Head: Normocephalic.     Mouth/Throat:     Mouth: Mucous membranes are moist.     Pharynx: Oropharynx is clear.  Cardiovascular:     Rate and Rhythm: Normal rate and regular rhythm.  Pulmonary:     Effort: Pulmonary effort  is normal.     Breath sounds: Normal breath sounds.  Abdominal:     General: Abdomen is flat. Bowel sounds are normal.     Palpations: Abdomen is soft.  Skin:    General: Skin is warm and dry.     Coloration: Skin is jaundiced.  Neurological:     Mental Status: She is alert and oriented to person, place, and time.  Psychiatric:        Mood and Affect: Mood normal.        Behavior: Behavior normal.      Imaging: ECHOCARDIOGRAM COMPLETE  Result Date: 03/20/2022    ECHOCARDIOGRAM REPORT   Patient Name:   Kathy Little Date of Exam: 03/20/2022 Medical Rec #:  [id number]      Height:       67.0 in Accession #:    [id number]     Weight:       158.1 lb Date of Birth:  [birth year]      BSA:          1.830 m Patient Age:    79 years       BP:           113/73 mmHg Patient Gender: F              HR:           110 bpm. Exam Location:  Inpatient Procedure: 2D Echo, Cardiac Doppler and Color Doppler Indications:    Abnormal ECG R94.31  History:        Patient has no prior history of Echocardiogram examinations.                 Risk Factors:Current Smoker.  Sonographer:    Ronny Flurry Referring Phys: [id number] [place]  1. Left ventricular ejection fraction, by estimation, is 45 to 50%. The left ventricle has mildly decreased function. The left ventricle demonstrates global hypokinesis. There is mild  concentric left ventricular hypertrophy. Left ventricular diastolic parameters are indeterminate.  2. Right ventricular systolic function is normal. The right ventricular size is normal.  3. The mitral valve is normal in structure. Trivial mitral valve regurgitation. No evidence of mitral stenosis.  4. The aortic valve is tricuspid. Aortic valve regurgitation is trivial. Aortic valve sclerosis/calcification is present, without any evidence of aortic stenosis.  5. Aortic dilatation noted. There is mild dilatation of the ascending aorta, measuring 39 mm. Comparison(s): No prior Echocardiogram. FINDINGS  Left Ventricle: Left ventricular ejection fraction, by estimation, is 45 to 50%. The left ventricle has mildly decreased function. The left ventricle demonstrates global hypokinesis. The left ventricular internal cavity size was normal in size. There is  mild concentric left ventricular hypertrophy. Left ventricular diastolic parameters are indeterminate. Right Ventricle: The right ventricular size is normal. Right ventricular systolic function is normal. Left Atrium: Left atrial size was normal in size. Right Atrium: Right atrial size was normal in size. Pericardium: Trivial pericardial effusion is present. Mitral Valve: The mitral valve is normal in structure. Trivial mitral valve regurgitation. No evidence of mitral valve stenosis. Tricuspid Valve: The tricuspid valve is normal in structure. Tricuspid valve regurgitation is trivial. No evidence of tricuspid stenosis. Aortic Valve: The aortic valve is tricuspid. Aortic valve regurgitation is trivial. Aortic valve sclerosis/calcification is present, without any evidence of aortic stenosis. Aortic valve mean gradient measures 6.5 mmHg. Aortic valve peak gradient measures 10.9 mmHg. Aortic valve area, by VTI measures 2.07 cm. Pulmonic Valve: The pulmonic valve was normal  in structure. Pulmonic valve regurgitation is not visualized. No evidence of pulmonic stenosis.  Aorta: Aortic dilatation noted. There is mild dilatation of the ascending aorta, measuring 39 mm. Venous: The inferior vena cava was not well visualized. IAS/Shunts: No atrial level shunt detected by color flow Doppler.  LEFT VENTRICLE PLAX 2D LVIDd:         4.60 cm   Diastology LVIDs:         3.00 cm   LV e' medial:    6.99 cm/s LV PW:         1.10 cm   LV E/e' medial:  8.0 LV IVS:        1.20 cm   LV e' lateral:   11.80 cm/s LVOT diam:     1.90 cm   LV E/e' lateral: 4.7 LV SV:         48 LV SV Index:   26 LVOT Area:     2.84 cm  RIGHT VENTRICLE RV S prime:     20.00 cm/s TAPSE (M-mode): 2.4 cm LEFT ATRIUM             Index        RIGHT ATRIUM           Index LA diam:        3.50 cm 1.91 cm/m   RA Area:     13.20 cm LA Vol (A2C):   56.2 ml 30.69 ml/m  RA Volume:   25.70 ml  14.05 ml/m LA Vol (A4C):   17.0 ml 9.29 ml/m LA Biplane Vol: 28.4 ml 15.52 ml/m  AORTIC VALVE AV Area (Vmax):    2.23 cm AV Area (Vmean):   2.04 cm AV Area (VTI):     2.07 cm AV Vmax:           165.00 cm/s AV Vmean:          116.500 cm/s AV VTI:            0.234 m AV Peak Grad:      10.9 mmHg AV Mean Grad:      6.5 mmHg LVOT Vmax:         130.00 cm/s LVOT Vmean:        83.633 cm/s LVOT VTI:          0.171 m LVOT/AV VTI ratio: 0.73  AORTA Ao Root diam: 3.50 cm Ao Asc diam:  3.90 cm MITRAL VALVE                TRICUSPID VALVE MV Area (PHT): 7.59 cm     TR Peak grad:   18.7 mmHg MV Decel Time: 100 msec     TR Vmax:        216.00 cm/s MV E velocity: 55.70 cm/s MV A velocity: 121.00 cm/s  SHUNTS MV E/A ratio:  0.46         Systemic VTI:  0.17 m                             Systemic Diam: 1.90 cm Kirk Ruths MD Electronically signed by Kirk Ruths MD Signature Date/Time: 03/20/2022/3:12:18 PM    Final    DG Chest Portable 1 View  Result Date: 03/19/2022 CLINICAL DATA:  Hematemesis.  Recent endoscopy. EXAM: PORTABLE CHEST 1 VIEW COMPARISON:  None Available. FINDINGS: The heart is normal in size. The mediastinal and hilar contours are  normal. Minimal streaky basilar scarring changes versus  subsegmental atelectasis. No pleural effusions or pulmonary infiltrates. The bony thorax is intact. IMPRESSION: 1. Minimal streaky basilar scarring changes versus subsegmental atelectasis. 2. No infiltrates or effusions. Electronically Signed   By: Marijo Sanes M.D.   On: 03/19/2022 15:52   IR Paracentesis  Result Date: 02/21/2022 INDICATION: 62 year old female. History of hepatitis C with progression to cirrhosis with recurrent ascites. Request for therapeutic and diagnostic paracentesis. Maximum of 7 liters. EXAM: ULTRASOUND GUIDED THERAPEUTIC AND DIAGNOSTIC PARACENTESIS MEDICATIONS: Lidocaine 1% 10 mL COMPLICATIONS: None immediate. PROCEDURE: Informed written consent was obtained from the patient after a discussion of the risks, benefits and alternatives to treatment. A timeout was performed prior to the initiation of the procedure. Initial ultrasound scanning demonstrates a large amount of ascites within the right lower abdominal quadrant. The right lower abdomen was prepped and draped in the usual sterile fashion. 1% lidocaine was used for local anesthesia. Following this, a 19 gauge, 7-cm, Yueh catheter was introduced. An ultrasound image was saved for documentation purposes. The paracentesis was performed. The catheter was removed and a dressing was applied. The patient tolerated the procedure well without immediate post procedural complication. Patient received post-procedure intravenous albumin; see nursing notes for details. FINDINGS: A total of approximately 7L of straw colored fluid was removed. Samples were sent to the laboratory as requested by the clinical team. IMPRESSION: Successful ultrasound-guided paracentesis yielding 7 liters of peritoneal fluid. Read by: Rushie Nyhan, NP PLAN: If the patient eventually requires >/=2 paracenteses in a 30 day period, candidacy for formal evaluation by the [facility] will be assessed. Electronically Signed   By: Sandi Mariscal M.D.   On: 02/21/2022 12:58    Labs:  CBC: Recent Labs    03/20/22 0401 03/20/22 0545 03/20/22 1849 03/21/22 0545  WBC 9.2 9.7 9.8 9.2  HGB 11.1* 10.5* 9.2* 9.2*  HCT 31.6* 30.7* 26.3* 27.4*  PLT 118* 107* 117* 101*    COAGS: Recent Labs    03/14/22 1503 03/19/22 1535 03/20/22 0815 03/21/22 0545  INR 1.4* 1.5* 1.9* 1.8*  APTT  --  34  --   --     BMP: Recent Labs    12/16/21 1040 01/17/22 1053 03/14/22 1503 03/19/22 1620 03/19/22 1629 03/20/22 0401 03/21/22 0545  NA 140   < > 135 134* 131* 131* 128*  K 3.4*   < > 3.7 4.4 4.3 5.1 4.7  CL 106   < > 104 101 103 103 102  CO2 25   < > 27  --  18* 18* 17*  GLUCOSE 94   < > 78 121* 127* 100* 106*  BUN 6   < > 12 25* 19 25* 37*  CALCIUM 8.6*   < > 8.4  --  7.9* 8.4* 8.3*  CREATININE 0.73   < > 0.69 0.50 0.70 0.87 1.01*  GFRNONAA >60  --   --   --  >60 >60 >60   < > = values in this interval not displayed.    LIVER FUNCTION TESTS: Recent Labs    03/14/22 1503 03/19/22 1629 03/20/22 0401 03/21/22 0545  BILITOT 2.8* 3.7* 6.2* 6.6*  AST 95* 168* 314* 583*  ALT 48* 68* 117* 232*  ALKPHOS 222* 136* 105 103  PROT 6.7 6.0* 5.6* 5.3*  ALBUMIN 2.5* 2.0* 2.2* 2.0*    TUMOR MARKERS: Recent Labs    01/17/22 1053  AFPTM 102.9*    Assessment and Plan: 62 y.o. female with recent diagnosis of decompensated  cirrhosis with recurrent ascites, UGIB from esophageal varices who was referred to IR for possible TIPS.    Risks and benefits of TIPS, BRTO and/or additional variceal embolization were discussed with the patient and/or the patient's family including, but not limited to, infection, bleeding, damage to adjacent structures, worsening hepatic and/or cardiac function, worsening and/or the development of altered mental status/encephalopathy, non-target embolization and death.   After thorough review and discussion with the patient,  it was determined that patient is not a good candidate for elective TIPS at the moment due to worsening LFT, patient potentially in acute decomensated liver failure.   Recommendations: - Trend LFT and other labs for MELD over weekend  - CT  BRTO  for further evaluation - IR will follow to determine if she can be scheduled for elective TIPS as outpatient - If patient rebleeds and become hemodynamically unstable, emergent TIPS can be considered. Please reach out to on call radiologist over the weekend.  - Consent for TIPS was NOT obtained during today's visit   Above recommendations were communicated with primary and GI team.  Please call IR for questions and concerns.    Thank you for this interesting consult.  I greatly enjoyed meeting Kathy Little and look forward to participating in their care.  A copy of this report was sent to the requesting provider on this date.  Electronically Signed: Tera Mater, PA-C 03/21/2022, 11:05 AM   I spent a total of 40 Minutes    in face to face in clinical consultation, greater than 50% of which was counseling/coordinating care for TIPS consult.   This chart was dictated using voice recognition software.  Despite best efforts to proofread,  errors can occur which can change the documentation meaning.

## 2022-03-21 NOTE — TOC Initial Note (Signed)
Transition of Care (TOC) - Initial/Assessment Note  Marvetta Gibbons RN,BSN Transitions of Care Unit 4NP (Non Trauma)- RN Case Manager See Treatment Team for direct Phone #   Patient Details  Name: Kathy Little MRN: [id number] Date of Birth: 1960/03/03  Transition of Care [facility]) CM/SW Contact:    Dawayne Patricia, RN Phone Number: 03/21/2022, 3:16 PM  Clinical Narrative:                 Transition of Care Department [facility]) has reviewed patient and note pt not a candidate for elective TIPS at this time per IR . Pt from home, anticipate return home when stable. We will continue to monitor patient advancement through interdisciplinary progression rounds. If new patient transition needs arise, please place a TOC consult.    Expected Discharge Plan: Home/Self Care Barriers to Discharge: Continued Medical Work up   Patient Goals and CMS Choice            Expected Discharge Plan and Services                                              Prior Living Arrangements/Services                       Activities of Daily Living Home Assistive Devices/Equipment: None ADL Screening (condition at time of admission) Patient's cognitive ability adequate to safely complete daily activities?: Yes Is the patient deaf or have difficulty hearing?: No Does the patient have difficulty seeing, even when wearing glasses/contacts?: No Does the patient have difficulty concentrating, remembering, or making decisions?: No Patient able to express need for assistance with ADLs?: Yes Does the patient have difficulty dressing or bathing?: No Independently performs ADLs?: No Communication: Independent Dressing (OT): Independent Grooming: Independent Feeding: Independent Does the patient have difficulty walking or climbing stairs?: No Weakness of Legs: Both Weakness of Arms/Hands: None  Permission Sought/Granted                  Emotional Assessment               Admission diagnosis:  Upper GI bleed [K92.2] Cirrhosis of liver with ascites, unspecified hepatic cirrhosis type ([place]) [K74.60, R18.8] Patient Active Problem List   Diagnosis Date Noted   Ascites 03/20/2022   Portal hypertension ([place]) 03/20/2022   Esophageal varices with bleeding ([place]) 03/19/2022   Decompensated cirrhosis related to hepatitis C virus (HCV) ([place]) 10/17/2014   Coronary artery calcification seen on CAT scan 09/17/2014   Elevated liver function tests 09/17/2014   Recurrent cervical cancer ([place]) 09/15/2014   Thrombocytopenia ([place]) 09/15/2014   Atherosclerosis of coronary artery 09/15/2014   Thoracic aorta atherosclerosis ([place]) 09/15/2014   Tobacco abuse, 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   Colon polyps 09/15/2014   Family hx of colon cancer 09/15/2014   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

## 2022-03-21 NOTE — Procedures (Signed)
PROCEDURE SUMMARY:  Successful ultrasound guided paracentesis from the right lower quadrant.  Yielded 8 liters of straw colored fluid.  No immediate complications.  The patient tolerated the procedure well.   Specimen was sent for labs.  EBL < 27mL  If the patient eventually requires >/=2 paracenteses in a 30 day period, screening evaluation by the [facility] will be assessed.

## 2022-03-22 DIAGNOSIS — I8511 Secondary esophageal varices with bleeding: Secondary | ICD-10-CM

## 2022-03-22 DIAGNOSIS — K7460 Unspecified cirrhosis of liver: Secondary | ICD-10-CM | POA: Diagnosis not present

## 2022-03-22 DIAGNOSIS — K652 Spontaneous bacterial peritonitis: Secondary | ICD-10-CM

## 2022-03-22 DIAGNOSIS — B1920 Unspecified viral hepatitis C without hepatic coma: Secondary | ICD-10-CM

## 2022-03-22 DIAGNOSIS — K7682 Hepatic encephalopathy: Secondary | ICD-10-CM | POA: Diagnosis not present

## 2022-03-22 DIAGNOSIS — R188 Other ascites: Secondary | ICD-10-CM

## 2022-03-22 DIAGNOSIS — Z87891 Personal history of nicotine dependence: Secondary | ICD-10-CM

## 2022-03-22 LAB — CBC
HCT: 22.5 % — ABNORMAL LOW (ref 36.0–46.0)
Hemoglobin: 7.5 g/dL — ABNORMAL LOW (ref 12.0–15.0)
MCH: 32.2 pg (ref 26.0–34.0)
MCHC: 33.3 g/dL (ref 30.0–36.0)
MCV: 96.6 fL (ref 80.0–100.0)
Platelets: 61 10*3/uL — ABNORMAL LOW (ref 150–400)
RBC: 2.33 MIL/uL — ABNORMAL LOW (ref 3.87–5.11)
RDW: 21.5 % — ABNORMAL HIGH (ref 11.5–15.5)
WBC: 2.4 10*3/uL — ABNORMAL LOW (ref 4.0–10.5)
nRBC: 0 % (ref 0.0–0.2)

## 2022-03-22 LAB — CBC WITH DIFFERENTIAL/PLATELET
Abs Immature Granulocytes: 0 10*3/uL (ref 0.00–0.07)
Basophils Absolute: 0.1 10*3/uL (ref 0.0–0.1)
Basophils Relative: 2 %
Eosinophils Absolute: 0.1 10*3/uL (ref 0.0–0.5)
Eosinophils Relative: 3 %
HCT: 25.3 % — ABNORMAL LOW (ref 36.0–46.0)
Hemoglobin: 8.7 g/dL — ABNORMAL LOW (ref 12.0–15.0)
Immature Granulocytes: 0 %
Lymphocytes Relative: 5 %
Lymphs Abs: 0.1 10*3/uL — ABNORMAL LOW (ref 0.7–4.0)
MCH: 32.7 pg (ref 26.0–34.0)
MCHC: 34.4 g/dL (ref 30.0–36.0)
MCV: 95.1 fL (ref 80.0–100.0)
Monocytes Absolute: 0.3 10*3/uL (ref 0.1–1.0)
Monocytes Relative: 9 %
Neutro Abs: 2.3 10*3/uL (ref 1.7–7.7)
Neutrophils Relative %: 81 %
Platelets: 76 10*3/uL — ABNORMAL LOW (ref 150–400)
RBC: 2.66 MIL/uL — ABNORMAL LOW (ref 3.87–5.11)
RDW: 21.8 % — ABNORMAL HIGH (ref 11.5–15.5)
Smear Review: DECREASED
WBC: 2.9 10*3/uL — ABNORMAL LOW (ref 4.0–10.5)
nRBC: 0 % (ref 0.0–0.2)

## 2022-03-22 LAB — COMPREHENSIVE METABOLIC PANEL
ALT: 121 U/L — ABNORMAL HIGH (ref 0–44)
ALT: 118 U/L — ABNORMAL HIGH (ref 0–44)
AST: 241 U/L — ABNORMAL HIGH (ref 15–41)
AST: 210 U/L — ABNORMAL HIGH (ref 15–41)
Albumin: 2.9 g/dL — ABNORMAL LOW (ref 3.5–5.0)
Albumin: 2.8 g/dL — ABNORMAL LOW (ref 3.5–5.0)
Alkaline Phosphatase: 68 U/L (ref 38–126)
Alkaline Phosphatase: 72 U/L (ref 38–126)
Anion gap: 6 (ref 5–15)
Anion gap: 5 (ref 5–15)
BUN: 19 mg/dL (ref 8–23)
BUN: 16 mg/dL (ref 8–23)
CO2: 21 mmol/L — ABNORMAL LOW (ref 22–32)
CO2: 21 mmol/L — ABNORMAL LOW (ref 22–32)
Calcium: 8.1 mg/dL — ABNORMAL LOW (ref 8.9–10.3)
Calcium: 8.1 mg/dL — ABNORMAL LOW (ref 8.9–10.3)
Chloride: 105 mmol/L (ref 98–111)
Chloride: 106 mmol/L (ref 98–111)
Creatinine, Ser: 0.83 mg/dL (ref 0.44–1.00)
Creatinine, Ser: 0.72 mg/dL (ref 0.44–1.00)
GFR, Estimated: >60 mL/min (ref >60)
GFR, Estimated: >60 mL/min (ref >60)
Glucose, Bld: 125 mg/dL — ABNORMAL HIGH (ref 70–99)
Glucose, Bld: 102 mg/dL — ABNORMAL HIGH (ref 70–99)
Potassium: 3.5 mmol/L (ref 3.5–5.1)
Potassium: 3.7 mmol/L (ref 3.5–5.1)
Sodium: 132 mmol/L — ABNORMAL LOW (ref 135–145)
Sodium: 132 mmol/L — ABNORMAL LOW (ref 135–145)
Total Bilirubin: 5.8 mg/dL — ABNORMAL HIGH (ref 0.3–1.2)
Total Bilirubin: 6.1 mg/dL — ABNORMAL HIGH (ref 0.3–1.2)
Total Protein: 5 g/dL — ABNORMAL LOW (ref 6.5–8.1)
Total Protein: 5.1 g/dL — ABNORMAL LOW (ref 6.5–8.1)

## 2022-03-22 LAB — GRAM STAIN

## 2022-03-22 LAB — TECHNOLOGIST SMEAR REVIEW: Plt Morphology: DECREASED

## 2022-03-22 MED ORDER — OXYCODONE HCL 5 MG PO TABS
5.0000 mg | ORAL_TABLET | Freq: Four times a day (QID) | ORAL | Status: DC | PRN
  Administered 2022-03-22: 5 mg via ORAL
  Filled 2022-03-22: qty 1

## 2022-03-22 NOTE — Progress Notes (Signed)
[facility] Progress Note  CC:  Hematemesis, cirrhosis   Subjective:  Looks like she had some BMs yesterday but nothing yet charted for today.  No reported signs of ongoing bleeding.  She says that she feels clearer in her head today.  Objective:  Vital signs in last 24 hours: Temp:  [97.3 F (36.3 C)-98.3 F (36.8 C)] 98 F (36.7 C) (03/16 1042) Pulse Rate:  [61-82] 80 (03/16 1042) Resp:  [15-20] 20 (03/16 1042) BP: (94-117)/(56-71) 112/68 (03/16 1042) SpO2:  [93 %-100 %] 95 % (03/16 1042) Weight:  [62.8 kg] 62.8 kg (03/16 0436) Last BM Date : 03/20/22 General:  Alert, chronically ill-appearing, in NAD Heart:  Regular rate and rhythm; no murmurs Pulm:  CTAB.  No W/R/R. Abdomen:  Soft, distended with ascites fluid.  BS present.  Some TTP. Extremities:  Without edema. Neurologic:  Alert and oriented x 4;  grossly normal neurologically. Psych:  Alert and cooperative. Normal mood and affect.  Intake/Output from previous day: 03/15 0701 - 03/16 0700 In: 1403.7 [P.O.:360; I.V.:872.4; IV Piggyback:171.4] Out: 625 [Urine:625]  Lab Results: Recent Labs    03/21/22 0545 03/21/22 1727 03/22/22 0706  WBC 9.2 4.2 2.4*  HGB 9.2* 8.1* 7.5*  HCT 27.4* 23.8* 22.5*  PLT 101* 77* 61*   BMET Recent Labs    03/21/22 1256 03/21/22 1727 03/22/22 0706  NA 129* 131* 132*  K 4.5 3.9 3.5  CL 104 104 105  CO2 22 21* 21*  GLUCOSE 89 128* 125*  BUN 33* 29* 19  CREATININE 0.98 0.95 0.83  CALCIUM 8.3* 8.4* 8.1*   LFT Recent Labs    03/21/22 0545 03/21/22 1256 03/22/22 0706  PROT 5.3*   < > 5.0*  ALBUMIN 2.0*   < > 2.9*  AST 583*   < > 241*  ALT 232*   < > 121*  ALKPHOS 103   < > 68  BILITOT 6.6*   < > 5.8*  BILIDIR 3.4*  --   --    < > = values in this interval not displayed.   PT/INR Recent Labs    03/20/22 0815 03/21/22 0545  LABPROT 21.5* 20.9*  INR 1.9* 1.8*   US LIVER DOPPLER  Result Date: 03/21/2022 CLINICAL DATA:  Cirrhosis EXAM: DUPLEX  ULTRASOUND OF LIVER TECHNIQUE: Color and duplex Doppler ultrasound was performed to evaluate the hepatic in-flow and out-flow vessels. COMPARISON:  03/21/2022 FINDINGS: Liver: Normal parenchymal echogenicity. Nodular hepatic contours. No focal lesion, mass or intrahepatic biliary ductal dilatation. Main Portal Vein size: 1.6 cm Portal Vein Velocities Main Prox:  19 cm/sec Main Mid: 26 cm/sec Main Dist:  21 cm/sec Right: 26 cm/sec Left: 20 cm/sec Hepatic Vein Velocities Right:  25 cm/sec Middle:  20 cm/sec Left:  21 cm/sec IVC: Present and patent with normal respiratory phasicity. Hepatic Artery Velocity:  93 cm/sec Splenic Vein Velocity:  24 cm/sec Spleen: 15 cm x 14 cm x 6 cm with a total volume of 661 cm^3 (411 cm^3 is upper limit normal) Portal Vein Occlusion/Thrombus: No Splenic Vein Occlusion/Thrombus: No Ascites: Mild perihepatic ascites Varices: None IMPRESSION: 1. Patent portal vein with appropriate direction of flow. 2. Cirrhotic liver morphology without focal hepatic lesion. 3. Splenomegaly. 4. Sludge seen within the gallbladder. Electronically Signed   By: Miachel Roux M.D.   On: 03/21/2022 16:11   CT ANGIO ABD/PELVIS BRTO  Result Date: 03/21/2022 CLINICAL DATA:  62 year old female with a history of esophageal varices EXAM: CTA ABDOMEN AND PELVIS WITHOUT  AND WITH CONTRAST TECHNIQUE: Multidetector CT imaging of the abdomen and pelvis was performed using the standard protocol during bolus administration of intravenous contrast. Multiplanar reconstructed images and MIPs were obtained and reviewed to evaluate the vascular anatomy. RADIATION DOSE REDUCTION: This exam was performed according to the departmental dose-optimization program which includes automated exposure control, adjustment of the mA and/or kV according to patient size and/or use of iterative reconstruction technique. CONTRAST:  180mL OMNIPAQUE IOHEXOL 350 MG/ML SOLN COMPARISON:  01/17/2022 FINDINGS: VASCULAR Aorta: Mild atherosclerotic  changes of the abdominal aorta. No aneurysm. No dissection. No ulcerated plaque or pedunculated plaque. Celiac: Celiac artery patent without significant atherosclerosis. SMA: SMA patent without significant atherosclerosis. Renals: - Right: Mild atherosclerosis at the origin of the right renal artery. Irregular beading in the mid and distal segment of the right renal artery. No aneurysm. - Left: Mild atherosclerotic changes at the origin of the left renal artery. Irregular beading in the mid segment of the left renal artery. IMA: Inferior mesenteric artery is patent. Right lower extremity: Unremarkable course, caliber, and contour of the right iliac system. Mild atherosclerotic changes. No aneurysm, dissection, or occlusion. Hypogastric artery is patent. Common femoral artery patent. Proximal SFA and profunda femoris patent. Left lower extremity: Unremarkable course, caliber, and contour of the left iliac system. Mild atherosclerotic changes. No aneurysm, dissection, or occlusion. Hypogastric artery is patent. Common femoral artery patent. Proximal SFA and profunda femoris patent. Venous: Portal venous: Hepatic veins patent with typical arrangement of the right middle and left hepatic veins. There is an accessory right hepatic vein to segment 6. Portal vein patent as well as the right and left portal vein branches. No intraluminal thrombus. Splenic vein patent. SMV and IMV patent. The left gastric vein/cardiac vein contributes to esophageal varices in the lower mediastinum. CT negative for evidence of gastric varices. There is no significant spleno gastro renal shunt. Systemic venous: Unremarkable hepatic IVC.  Unremarkable abdominal IVC. Unremarkable bilateral renal veins. Right iliac venous: The right common iliac vein and external iliac vein are patent. No evidence of thrombus. The right internal iliac veins are patent. No significant stenosis of the right common iliac vein. The visualized proximal femoral veins  appear patent. Left iliac venous: The left common iliac vein is somewhat compressed by the overlying right iliac arterial system in a typical configuration with no significant stenosis identified or evidence of thrombus. Left common iliac vein, external iliac vein, and the left internal iliac veins appear patent. The visualized proximal left femoral veins appear patent. Review of the MIP images confirms the above findings. NON-VASCULAR Lower chest: No acute finding of the lower chest. Atelectasis/scarring lower chest. Hepatobiliary: Nodular contour of the liver, with enlarged caudate relative to the decreasing size of the remainder of the liver compatible with cirrhosis. Unremarkable gallbladder. Pancreas: Unremarkable Spleen: Diameter of the spleen on the axial images at least 11 cm. Adrenals/Urinary Tract: - Right adrenal gland: Unremarkable - Left adrenal gland: Unremarkable. - Right kidney: No hydronephrosis, nephrolithiasis, inflammation, or ureteral dilation. No focal lesion. - Left Kidney: No hydronephrosis, nephrolithiasis, inflammation, or ureteral dilation. No focal lesion. - Urinary Bladder: Unremarkable. Stomach/Bowel: - Stomach: Negative for hemorrhage. Global edema of the gastric wall. No focality. Negative for gastric varices. - Small bowel: Diffuse small bowel wall circumferential thickening/edema. No abnormal distension. No air-fluid levels. Enhancement of the mucosa maintained. - Appendix: Appendix is not visualized, however, no inflammatory changes are present adjacent to the cecum to indicate an appendicitis. - Colon: Mild edema and colon  wall thickening. Enhancement of the mucosa maintained. Lymphatic: No adenopathy. Mesenteric: Small to moderate volume ascites. Reproductive: Hysterectomy Other: Body wall edema/anasarca Musculoskeletal: No acute displaced fracture. No bony canal narrowing. IMPRESSION: CT angiogram confirms the presence of esophageal varices, with no evidence of gastric varices.  Negative for significant spleno gastro renal shunt. Additional stigmata of cirrhosis and portal hypertension with small-moderate volume ascites and splenomegaly. Diffuse bowel wall edema/thickening involving the stomach, small bowel, and colon, with the mucosal enhancement maintained. Enteropathy of protein loss would be a leading differential, however, other inflammatory or infectious enteritis could also have this appearance. Aortic Atherosclerosis (ICD10-I70.0). Additional ancillary findings as above. Signed, Dulcy Fanny. Nadene Rubins, RPVI [facility] Electronically Signed   By: Corrie Mckusick D.O.   On: 03/21/2022 15:51   IR Paracentesis  Result Date: 03/21/2022 INDICATION: Patient history of hepatitis-C, decompensated cirrhosis, with esophageal varices and ascites. Request is for therapeutic and diagnostic paracentesis EXAM: ULTRASOUND GUIDED THERAPEUTIC AND DIAGNOSTIC PARACENTESIS MEDICATIONS: LIDOCAINE 1% 10 ML COMPLICATIONS: None immediate. PROCEDURE: Informed written consent was obtained from the patient after a discussion of the risks, benefits and alternatives to treatment. A timeout was performed prior to the initiation of the procedure. Initial ultrasound scanning demonstrates a large amount of ascites within the right lower abdominal quadrant. The right lower abdomen was prepped and draped in the usual sterile fashion. 1% lidocaine was used for local anesthesia. Following this, a 19 gauge, 7-cm, Yueh catheter was introduced. An ultrasound image was saved for documentation purposes. The paracentesis was performed. The catheter was removed and a dressing was applied. The patient tolerated the procedure well without immediate post procedural complication. Patient received post-procedure intravenous albumin; see nursing notes for details. FINDINGS: A total of approximately 8 L of straw-colored fluid was removed. Samples were sent to the  laboratory as requested by the clinical team. IMPRESSION: Successful ultrasound-guided paracentesis yielding 8 liters of peritoneal fluid. Read by: Rushie Nyhan, NP PLAN: If the patient eventually requires >/=2 paracenteses in a 30 day period, candidacy for formal evaluation by the [facility] will be assessed. Electronically Signed   By: Markus Daft M.D.   On: 03/21/2022 11:22   ECHOCARDIOGRAM COMPLETE  Result Date: 03/20/2022    ECHOCARDIOGRAM REPORT   Patient Name:   ROGENIA LIMON Date of Exam: 03/20/2022 Medical Rec #:  [id number]      Height:       67.0 in Accession #:    [id number]     Weight:       158.1 lb Date of Birth:  19-Dec-1960      BSA:          1.830 m Patient Age:    65 years       BP:           113/73 mmHg Patient Gender: F              HR:           110 bpm. Exam Location:  Inpatient Procedure: 2D Echo, Cardiac Doppler and Color Doppler Indications:    Abnormal ECG R94.31  History:        Patient has no prior history of Echocardiogram examinations.                 Risk Factors:Current Smoker.  Sonographer:    Ronny Flurry Referring Phys: [id number] [place]  1. Left ventricular ejection fraction, by estimation, is 45  to 50%. The left ventricle has mildly decreased function. The left ventricle demonstrates global hypokinesis. There is mild concentric left ventricular hypertrophy. Left ventricular diastolic parameters are indeterminate.  2. Right ventricular systolic function is normal. The right ventricular size is normal.  3. The mitral valve is normal in structure. Trivial mitral valve regurgitation. No evidence of mitral stenosis.  4. The aortic valve is tricuspid. Aortic valve regurgitation is trivial. Aortic valve sclerosis/calcification is present, without any evidence of aortic stenosis.  5. Aortic dilatation noted. There is mild dilatation of the ascending aorta, measuring 39 mm. Comparison(s): No prior  Echocardiogram. FINDINGS  Left Ventricle: Left ventricular ejection fraction, by estimation, is 45 to 50%. The left ventricle has mildly decreased function. The left ventricle demonstrates global hypokinesis. The left ventricular internal cavity size was normal in size. There is  mild concentric left ventricular hypertrophy. Left ventricular diastolic parameters are indeterminate. Right Ventricle: The right ventricular size is normal. Right ventricular systolic function is normal. Left Atrium: Left atrial size was normal in size. Right Atrium: Right atrial size was normal in size. Pericardium: Trivial pericardial effusion is present. Mitral Valve: The mitral valve is normal in structure. Trivial mitral valve regurgitation. No evidence of mitral valve stenosis. Tricuspid Valve: The tricuspid valve is normal in structure. Tricuspid valve regurgitation is trivial. No evidence of tricuspid stenosis. Aortic Valve: The aortic valve is tricuspid. Aortic valve regurgitation is trivial. Aortic valve sclerosis/calcification is present, without any evidence of aortic stenosis. Aortic valve mean gradient measures 6.5 mmHg. Aortic valve peak gradient measures 10.9 mmHg. Aortic valve area, by VTI measures 2.07 cm. Pulmonic Valve: The pulmonic valve was normal in structure. Pulmonic valve regurgitation is not visualized. No evidence of pulmonic stenosis. Aorta: Aortic dilatation noted. There is mild dilatation of the ascending aorta, measuring 39 mm. Venous: The inferior vena cava was not well visualized. IAS/Shunts: No atrial level shunt detected by color flow Doppler.  LEFT VENTRICLE PLAX 2D LVIDd:         4.60 cm   Diastology LVIDs:         3.00 cm   LV e' medial:    6.99 cm/s LV PW:         1.10 cm   LV E/e' medial:  8.0 LV IVS:        1.20 cm   LV e' lateral:   11.80 cm/s LVOT diam:     1.90 cm   LV E/e' lateral: 4.7 LV SV:         48 LV SV Index:   26 LVOT Area:     2.84 cm  RIGHT VENTRICLE RV S prime:     20.00 cm/s TAPSE  (M-mode): 2.4 cm LEFT ATRIUM             Index        RIGHT ATRIUM           Index LA diam:        3.50 cm 1.91 cm/m   RA Area:     13.20 cm LA Vol (A2C):   56.2 ml 30.69 ml/m  RA Volume:   25.70 ml  14.05 ml/m LA Vol (A4C):   17.0 ml 9.29 ml/m LA Biplane Vol: 28.4 ml 15.52 ml/m  AORTIC VALVE AV Area (Vmax):    2.23 cm AV Area (Vmean):   2.04 cm AV Area (VTI):     2.07 cm AV Vmax:           165.00 cm/s AV  Vmean:          116.500 cm/s AV VTI:            0.234 m AV Peak Grad:      10.9 mmHg AV Mean Grad:      6.5 mmHg LVOT Vmax:         130.00 cm/s LVOT Vmean:        83.633 cm/s LVOT VTI:          0.171 m LVOT/AV VTI ratio: 0.73  AORTA Ao Root diam: 3.50 cm Ao Asc diam:  3.90 cm MITRAL VALVE                TRICUSPID VALVE MV Area (PHT): 7.59 cm     TR Peak grad:   18.7 mmHg MV Decel Time: 100 msec     TR Vmax:        216.00 cm/s MV E velocity: 55.70 cm/s MV A velocity: 121.00 cm/s  SHUNTS MV E/A ratio:  0.46         Systemic VTI:  0.17 m                             Systemic Diam: 1.90 cm Kirk Ruths MD Electronically signed by Kirk Ruths MD Signature Date/Time: 03/20/2022/3:12:18 PM    Final     Assessment / Plan: 62 year old female with chronic hepatitis C genotype 1a with concomitant Hep B core total antibody positivity newly diagnosed with decompensated cirrhosis. Failed Hep C treatment with Interferon in the 1990's.  Hep C RNA quant 1,200,000. AFP 102.9. MR liver showed cirrhosis, no hepatoma. ANA positive. IgG 2,430. Normal SMA. S/P paracentesis 01/22/2022 (6L of peritoneal fluid removed). No evidence of SBP and cytology was negative for malignancy. No overt hepatic encephalopathy, but nurse reports maybe some mild confusion today.  Ammonia level is 52.  Had another paracentesis today with 8 Liters removed.   Cell count from today c/w SBP.  Is already on Rocephin.   Hematemesis:  Hgb 10.5 grams down from 13.5-14.5 grams baseline.  This was after 2 units PRBCs.  Hgb this AM stable at  9.2-->8.1-->7.5 grams.  No further sign of bleeding.   EGD 3/14: - No gross lesions in the proximal esophagus. - Grade III esophageal varices oozing blood. Completely eradicated. Banded. - Z-line irregular, 35 cm from the incisors. - 2 cm hiatal hernia. - Clotted blood in the cardia, in the gastric fundus and in the gastric body. - Portal hypertensive gastropathy. - No gross lesions in the duodenal bulb, in the first portion of the duodenum and in the second portion of the duodenum.   Not deemed to be candidate for elective TIPS at this time per IR.  They recommended continuing to trend her labs and MELD score and order CT scan BRTO for reassessment by them early next week.  They will reassess based on these findings.   Coagulopathy. INR 1.8   Thrombocytopenia:  Platelets 61   Hyponatremia:  Na+ 132  MELD 3.0: 24 at 03/22/2022  7:06 AM MELD-Na: 23 at 03/22/2022  7:06 AM Calculated from: Serum Creatinine: 0.83 mg/dL (Using min of 1 mg/dL) at 03/22/2022  7:06 AM Serum Sodium: 132 mmol/L at 03/22/2022  7:06 AM Total Bilirubin: 5.8 mg/dL at 03/22/2022  7:06 AM Serum Albumin: 2.9 g/dL at 03/22/2022  7:06 AM INR(ratio): 1.8 at 03/21/2022  5:45 AM Age at listing (hypothetical): 62 years Sex: Female at 03/22/2022  7:06 AM   -PPI gtt for 72 hours from admission. -Octreotide gtt for 72 hours from admission. -Continue Rocephin. -Carafate slurry BID. -Lactulose 10 grams TID has been ordered.  Titrate to 2-3 soft stools daily. -Will need another diagnostic paracentesis on 3/18 to be sure cell count is trending down. -Albumin 100 grams was given 3/15 and and should receive another 75 grams on 3/17.    LOS: 3 days   Laban Emperor. Adella Manolis  03/22/2022, 1:01 PM

## 2022-03-22 NOTE — Progress Notes (Signed)
HD#3 Subjective:  Overnight Events: No events  Patient is in good spirits today.  She is alert, awake and oriented.  Answers all questions appropriately.  Patient states she is doing better but she is having some abdominal pain this morning from the site of the paracentesis. She denies further hematemesis. She is unsure if she has bleeding in the stool. Had 3-4 bowel movements yesterday.   Objective:  Vital signs in last 24 hours: Vitals:   03/22/22 0410 03/22/22 0436 03/22/22 0851 03/22/22 1042  BP:   (!) 94/59 112/68  Pulse:   82 80  Resp:   17 20  Temp: 97.9 F (36.6 C)  98.3 F (36.8 C) 98 F (36.7 C)  TempSrc: Oral  Oral Oral  SpO2:   94% 95%  Weight:  62.8 kg    Height:       Supplemental O2: Room Air SpO2: 95 %   Physical Exam:  Physical Exam Constitutional:      General: She is not in acute distress. HENT:     Head: Normocephalic.  Eyes:     General: Scleral icterus present.        Right eye: No discharge.        Left eye: No discharge.  Cardiovascular:     Rate and Rhythm: Normal rate and regular rhythm.  Pulmonary:     Effort: Pulmonary effort is normal. No respiratory distress.  Abdominal:     Comments: Abdomen is soft.  Mild tenderness to palpation in the right lower abdomen at the paracentesis site.  There is no hematoma or change in overlying skin.  Bowel sound present.  Musculoskeletal:     Right lower leg: Edema (+1) present.     Left lower leg: Edema (+1) present.  Skin:    General: Skin is warm.  Neurological:     Mental Status: She is alert. Mental status is at baseline.  Psychiatric:        Mood and Affect: Mood normal.     Filed Weights   03/20/22 1023 03/21/22 0500 03/22/22 0436  Weight: 71.7 kg 70.4 kg 62.8 kg     Intake/Output Summary (Last 24 hours) at 03/22/2022 1207 Last data filed at 03/22/2022 0600 Gross per 24 hour  Intake 1403.74 ml  Output 625 ml  Net 778.74 ml   Net IO Since Admission: 2,967.24 mL [03/22/22  1207]  Pertinent Labs:    Latest Ref Rng & Units 03/22/2022    7:06 AM 03/21/2022    5:27 PM 03/21/2022    5:45 AM  CBC  WBC 4.0 - 10.5 K/uL 2.4  4.2  9.2   Hemoglobin 12.0 - 15.0 g/dL 7.5  8.1  9.2   Hematocrit 36.0 - 46.0 % 22.5  23.8  27.4   Platelets 150 - 400 K/uL 61  77  101        Latest Ref Rng & Units 03/22/2022    7:06 AM 03/21/2022    5:27 PM 03/21/2022   12:56 PM  CMP  Glucose 70 - 99 mg/dL 125  128  89   BUN 8 - 23 mg/dL 19  29  33   Creatinine 0.44 - 1.00 mg/dL 0.83  0.95  0.98   Sodium 135 - 145 mmol/L 132  131  129   Potassium 3.5 - 5.1 mmol/L 3.5  3.9  4.5   Chloride 98 - 111 mmol/L 105  104  104   CO2 22 - 32 mmol/L 21  21  22   Calcium 8.9 - 10.3 mg/dL 8.1  8.4  8.3   Total Protein 6.5 - 8.1 g/dL 5.0  5.2  5.1   Total Bilirubin 0.3 - 1.2 mg/dL 5.8  5.9  6.8   Alkaline Phos 38 - 126 U/L 68  80  91   AST 15 - 41 U/L 241  387  485   ALT 0 - 44 U/L 121  170  215     Imaging: US LIVER DOPPLER  Result Date: 03/21/2022 CLINICAL DATA:  Cirrhosis EXAM: DUPLEX ULTRASOUND OF LIVER TECHNIQUE: Color and duplex Doppler ultrasound was performed to evaluate the hepatic in-flow and out-flow vessels. COMPARISON:  03/21/2022 FINDINGS: Liver: Normal parenchymal echogenicity. Nodular hepatic contours. No focal lesion, mass or intrahepatic biliary ductal dilatation. Main Portal Vein size: 1.6 cm Portal Vein Velocities Main Prox:  19 cm/sec Main Mid: 26 cm/sec Main Dist:  21 cm/sec Right: 26 cm/sec Left: 20 cm/sec Hepatic Vein Velocities Right:  25 cm/sec Middle:  20 cm/sec Left:  21 cm/sec IVC: Present and patent with normal respiratory phasicity. Hepatic Artery Velocity:  93 cm/sec Splenic Vein Velocity:  24 cm/sec Spleen: 15 cm x 14 cm x 6 cm with a total volume of 661 cm^3 (411 cm^3 is upper limit normal) Portal Vein Occlusion/Thrombus: No Splenic Vein Occlusion/Thrombus: No Ascites: Mild perihepatic ascites Varices: None IMPRESSION: 1. Patent portal vein with appropriate direction  of flow. 2. Cirrhotic liver morphology without focal hepatic lesion. 3. Splenomegaly. 4. Sludge seen within the gallbladder. Electronically Signed   By: Miachel Roux M.D.   On: 03/21/2022 16:11   CT ANGIO ABD/PELVIS BRTO  Result Date: 03/21/2022 CLINICAL DATA:  62 year old female with a history of esophageal varices EXAM: CTA ABDOMEN AND PELVIS WITHOUT AND WITH CONTRAST TECHNIQUE: Multidetector CT imaging of the abdomen and pelvis was performed using the standard protocol during bolus administration of intravenous contrast. Multiplanar reconstructed images and MIPs were obtained and reviewed to evaluate the vascular anatomy. RADIATION DOSE REDUCTION: This exam was performed according to the departmental dose-optimization program which includes automated exposure control, adjustment of the mA and/or kV according to patient size and/or use of iterative reconstruction technique. CONTRAST:  160mL OMNIPAQUE IOHEXOL 350 MG/ML SOLN COMPARISON:  01/17/2022 FINDINGS: VASCULAR Aorta: Mild atherosclerotic changes of the abdominal aorta. No aneurysm. No dissection. No ulcerated plaque or pedunculated plaque. Celiac: Celiac artery patent without significant atherosclerosis. SMA: SMA patent without significant atherosclerosis. Renals: - Right: Mild atherosclerosis at the origin of the right renal artery. Irregular beading in the mid and distal segment of the right renal artery. No aneurysm. - Left: Mild atherosclerotic changes at the origin of the left renal artery. Irregular beading in the mid segment of the left renal artery. IMA: Inferior mesenteric artery is patent. Right lower extremity: Unremarkable course, caliber, and contour of the right iliac system. Mild atherosclerotic changes. No aneurysm, dissection, or occlusion. Hypogastric artery is patent. Common femoral artery patent. Proximal SFA and profunda femoris patent. Left lower extremity: Unremarkable course, caliber, and contour of the left iliac system. Mild  atherosclerotic changes. No aneurysm, dissection, or occlusion. Hypogastric artery is patent. Common femoral artery patent. Proximal SFA and profunda femoris patent. Venous: Portal venous: Hepatic veins patent with typical arrangement of the right middle and left hepatic veins. There is an accessory right hepatic vein to segment 6. Portal vein patent as well as the right and left portal vein branches. No intraluminal thrombus. Splenic vein patent. SMV and IMV patent. The left  gastric vein/cardiac vein contributes to esophageal varices in the lower mediastinum. CT negative for evidence of gastric varices. There is no significant spleno gastro renal shunt. Systemic venous: Unremarkable hepatic IVC.  Unremarkable abdominal IVC. Unremarkable bilateral renal veins. Right iliac venous: The right common iliac vein and external iliac vein are patent. No evidence of thrombus. The right internal iliac veins are patent. No significant stenosis of the right common iliac vein. The visualized proximal femoral veins appear patent. Left iliac venous: The left common iliac vein is somewhat compressed by the overlying right iliac arterial system in a typical configuration with no significant stenosis identified or evidence of thrombus. Left common iliac vein, external iliac vein, and the left internal iliac veins appear patent. The visualized proximal left femoral veins appear patent. Review of the MIP images confirms the above findings. NON-VASCULAR Lower chest: No acute finding of the lower chest. Atelectasis/scarring lower chest. Hepatobiliary: Nodular contour of the liver, with enlarged caudate relative to the decreasing size of the remainder of the liver compatible with cirrhosis. Unremarkable gallbladder. Pancreas: Unremarkable Spleen: Diameter of the spleen on the axial images at least 11 cm. Adrenals/Urinary Tract: - Right adrenal gland: Unremarkable - Left adrenal gland: Unremarkable. - Right kidney: No hydronephrosis,  nephrolithiasis, inflammation, or ureteral dilation. No focal lesion. - Left Kidney: No hydronephrosis, nephrolithiasis, inflammation, or ureteral dilation. No focal lesion. - Urinary Bladder: Unremarkable. Stomach/Bowel: - Stomach: Negative for hemorrhage. Global edema of the gastric wall. No focality. Negative for gastric varices. - Small bowel: Diffuse small bowel wall circumferential thickening/edema. No abnormal distension. No air-fluid levels. Enhancement of the mucosa maintained. - Appendix: Appendix is not visualized, however, no inflammatory changes are present adjacent to the cecum to indicate an appendicitis. - Colon: Mild edema and colon wall thickening. Enhancement of the mucosa maintained. Lymphatic: No adenopathy. Mesenteric: Small to moderate volume ascites. Reproductive: Hysterectomy Other: Body wall edema/anasarca Musculoskeletal: No acute displaced fracture. No bony canal narrowing. IMPRESSION: CT angiogram confirms the presence of esophageal varices, with no evidence of gastric varices. Negative for significant spleno gastro renal shunt. Additional stigmata of cirrhosis and portal hypertension with small-moderate volume ascites and splenomegaly. Diffuse bowel wall edema/thickening involving the stomach, small bowel, and colon, with the mucosal enhancement maintained. Enteropathy of protein loss would be a leading differential, however, other inflammatory or infectious enteritis could also have this appearance. Aortic Atherosclerosis (ICD10-I70.0). Additional ancillary findings as above. Signed, Dulcy Fanny. Nadene Rubins, RPVI [facility] Electronically Signed   By: Corrie Mckusick D.O.   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   Bleeding esophageal varices Patient no longer has bleeding episode.  She underwent EGD with 5 bands placed hemoglobin trending down slowly likely catching up from prior bleed. Patient is not a candidate for emergent TIPS per IR due to her encephalopathy liver injury.  IR will reevaluate her on Monday. -Protonix infusion and octreotide (day 2/3) -Continue sucralfate -Continue midodrine for now.  Can titrate  off if blood pressure allows. -CBC twice daily -Transfuse if hemoglobin < 7  Decompensated cirrhosis Untreated hep C Poor prognosis overall with multiple complication, cirrhosis including esophageal varices, ascites, SBP and cephalopathy.  MELD score is 20 and she is child Pugh class C, very high risk for abdominal procedure.  Patient will need to follow-up with her hepatologist and possible transplant continue with curative.  Will need to follow-up with ID office for her hep C treatment as well. -No evidence of hepatorenal  SBP Ascites fluid showed 1000 cell count with 78% neutrophil counts, with SBP.  This is a first SBP episode.  Given she is on IV ceftriaxone.  Blood cultures negative to date.  GI recommended to rule a repeat paracentesis on 3/18 to look for response of antibiotic. -Follow ascitic culture -Continue albumin protocol per GI (75 g on 3/17)  Hepatic encephalopathy Her mental status improved significantly today.  She is alert, awake and oriented.  She had 3 BM yesterday. -Lactulose 10 g TID  Thrombocytopenia Platelet is 61 today, trended down from 110 since admission.  This could be due to her liver injury and acute illness.  She has not had any heparin products.  No signs of DIC.  -Obtain  peripheral blood smear -Trend CBC  Leukopenia This is a chronic issue.  Her WBC was normal at admission but is actually elevated from her baseline.  Likely due to her GI bleed and SBP. -Trend CBC  Diet:  full liquid IVF: None,None VTE: None SCD Code: Full PT/OT recs: None, none. TOC recs: NA  Dispo: Anticipated discharge to Home in 4 days pending IV PPI and TIPS eval.   Gaylan Gerold, DO 03/22/2022, 12:07 PM Pager: [phone]  Please contact the on call pager after 5 pm and on weekends at [phone].

## 2022-03-23 DIAGNOSIS — I8511 Secondary esophageal varices with bleeding: Secondary | ICD-10-CM | POA: Diagnosis not present

## 2022-03-23 DIAGNOSIS — K652 Spontaneous bacterial peritonitis: Secondary | ICD-10-CM | POA: Diagnosis not present

## 2022-03-23 DIAGNOSIS — K7682 Hepatic encephalopathy: Secondary | ICD-10-CM | POA: Diagnosis not present

## 2022-03-23 DIAGNOSIS — R7989 Other specified abnormal findings of blood chemistry: Secondary | ICD-10-CM

## 2022-03-23 DIAGNOSIS — K7460 Unspecified cirrhosis of liver: Secondary | ICD-10-CM | POA: Diagnosis not present

## 2022-03-23 LAB — COMPREHENSIVE METABOLIC PANEL
ALT: 94 U/L — ABNORMAL HIGH (ref 0–44)
AST: 147 U/L — ABNORMAL HIGH (ref 15–41)
Albumin: 2.9 g/dL — ABNORMAL LOW (ref 3.5–5.0)
Alkaline Phosphatase: 65 U/L (ref 38–126)
Anion gap: 6 (ref 5–15)
BUN: 14 mg/dL (ref 8–23)
CO2: 18 mmol/L — ABNORMAL LOW (ref 22–32)
Calcium: 8.1 mg/dL — ABNORMAL LOW (ref 8.9–10.3)
Chloride: 107 mmol/L (ref 98–111)
Creatinine, Ser: 0.69 mg/dL (ref 0.44–1.00)
GFR, Estimated: >60 mL/min (ref >60)
Glucose, Bld: 137 mg/dL — ABNORMAL HIGH (ref 70–99)
Potassium: 3.6 mmol/L (ref 3.5–5.1)
Sodium: 131 mmol/L — ABNORMAL LOW (ref 135–145)
Total Bilirubin: 6.5 mg/dL — ABNORMAL HIGH (ref 0.3–1.2)
Total Protein: 5.1 g/dL — ABNORMAL LOW (ref 6.5–8.1)

## 2022-03-23 LAB — CBC
HCT: 25.5 % — ABNORMAL LOW (ref 36.0–46.0)
Hemoglobin: 8.4 g/dL — ABNORMAL LOW (ref 12.0–15.0)
MCH: 33.1 pg (ref 26.0–34.0)
MCHC: 32.9 g/dL (ref 30.0–36.0)
MCV: 100.4 fL — ABNORMAL HIGH (ref 80.0–100.0)
Platelets: 69 10*3/uL — ABNORMAL LOW (ref 150–400)
RBC: 2.54 MIL/uL — ABNORMAL LOW (ref 3.87–5.11)
RDW: 22.5 % — ABNORMAL HIGH (ref 11.5–15.5)
WBC: 1.9 10*3/uL — ABNORMAL LOW (ref 4.0–10.5)
nRBC: 0 % (ref 0.0–0.2)

## 2022-03-23 LAB — GLUCOSE, CAPILLARY: Glucose-Capillary: 184 mg/dL — ABNORMAL HIGH (ref 70–99)

## 2022-03-23 MED ORDER — ALBUMIN HUMAN 25 % IV SOLN
25.0000 g | Freq: Once | INTRAVENOUS | Status: AC
  Administered 2022-03-23: 25 g via INTRAVENOUS
  Filled 2022-03-23: qty 100

## 2022-03-23 MED ORDER — LACTULOSE 10 GM/15ML PO SOLN
20.0000 g | Freq: Three times a day (TID) | ORAL | Status: DC
  Administered 2022-03-23 (×3): 20 g via ORAL
  Filled 2022-03-23 (×3): qty 30

## 2022-03-23 MED ORDER — ALBUMIN HUMAN 25 % IV SOLN
25.0000 g | Freq: Once | INTRAVENOUS | Status: AC
  Administered 2022-03-24: 25 g via INTRAVENOUS
  Filled 2022-03-23: qty 100

## 2022-03-23 NOTE — Progress Notes (Signed)
[facility] Progress Note  CC:  Hematemesis, cirrhosis   Subjective:  Feels pretty good.  Abdomen filling up with fluid again.  Would like to eat.   Objective:  Vital signs in last 24 hours: Temp:  [97.5 F (36.4 C)-98.6 F (37 C)] 98.5 F (36.9 C) (03/17 1102) Pulse Rate:  [68-76] 76 (03/17 1102) Resp:  [14-22] 20 (03/17 1102) BP: (110-122)/(65-70) 122/68 (03/17 1102) SpO2:  [91 %-98 %] 97 % (03/17 1102) Weight:  [65.9 kg] 65.9 kg (03/17 0500) Last BM Date : 03/20/22 General:  Alert, Well-developed, in NAD Heart:  Regular rate and rhythm; no murmurs Pulm:  CTAB.  No W/R/R. Abdomen:  Soft, distended with ascites fluid.  BS present.  Non-tender. Extremities:  Without edema. Neurologic:  Alert and oriented x 4;  grossly normal neurologically. Psych:  Alert and cooperative. Normal mood and affect.  Intake/Output from previous day: 03/16 0701 - 03/17 0700 In: 795.5 [I.V.:645.5; IV Piggyback:150] Out: -  Intake/Output this shift: Total I/O In: 198.1 [I.V.:198.1] Out: -   Lab Results: Recent Labs    03/22/22 0706 03/22/22 1816 03/23/22 0922  WBC 2.4* 2.9* 1.9*  HGB 7.5* 8.7* 8.4*  HCT 22.5* 25.3* 25.5*  PLT 61* 76* 69*   BMET Recent Labs    03/22/22 0706 03/22/22 1816 03/23/22 0922  NA 132* 132* 131*  K 3.5 3.7 3.6  CL 105 106 107  CO2 21* 21* 18*  GLUCOSE 125* 102* 137*  BUN 19 16 14   CREATININE 0.83 0.72 0.69  CALCIUM 8.1* 8.1* 8.1*   LFT Recent Labs    03/21/22 0545 03/21/22 1256 03/23/22 0922  PROT 5.3*   < > 5.1*  ALBUMIN 2.0*   < > 2.9*  AST 583*   < > 147*  ALT 232*   < > 94*  ALKPHOS 103   < > 65  BILITOT 6.6*   < > 6.5*  BILIDIR 3.4*  --   --    < > = values in this interval not displayed.   PT/INR Recent Labs    03/21/22 0545  LABPROT 20.9*  INR 1.8*   US LIVER DOPPLER  Result Date: 03/21/2022 CLINICAL DATA:  Cirrhosis EXAM: DUPLEX ULTRASOUND OF LIVER TECHNIQUE: Color and duplex Doppler ultrasound was  performed to evaluate the hepatic in-flow and out-flow vessels. COMPARISON:  03/21/2022 FINDINGS: Liver: Normal parenchymal echogenicity. Nodular hepatic contours. No focal lesion, mass or intrahepatic biliary ductal dilatation. Main Portal Vein size: 1.6 cm Portal Vein Velocities Main Prox:  19 cm/sec Main Mid: 26 cm/sec Main Dist:  21 cm/sec Right: 26 cm/sec Left: 20 cm/sec Hepatic Vein Velocities Right:  25 cm/sec Middle:  20 cm/sec Left:  21 cm/sec IVC: Present and patent with normal respiratory phasicity. Hepatic Artery Velocity:  93 cm/sec Splenic Vein Velocity:  24 cm/sec Spleen: 15 cm x 14 cm x 6 cm with a total volume of 661 cm^3 (411 cm^3 is upper limit normal) Portal Vein Occlusion/Thrombus: No Splenic Vein Occlusion/Thrombus: No Ascites: Mild perihepatic ascites Varices: None IMPRESSION: 1. Patent portal vein with appropriate direction of flow. 2. Cirrhotic liver morphology without focal hepatic lesion. 3. Splenomegaly. 4. Sludge seen within the gallbladder. Electronically Signed   By: Miachel Roux M.D.   On: 03/21/2022 16:11   CT ANGIO ABD/PELVIS BRTO  Result Date: 03/21/2022 CLINICAL DATA:  62 year old female with a history of esophageal varices EXAM: CTA ABDOMEN AND PELVIS WITHOUT AND WITH CONTRAST TECHNIQUE: Multidetector CT imaging of the abdomen and  pelvis was performed using the standard protocol during bolus administration of intravenous contrast. Multiplanar reconstructed images and MIPs were obtained and reviewed to evaluate the vascular anatomy. RADIATION DOSE REDUCTION: This exam was performed according to the departmental dose-optimization program which includes automated exposure control, adjustment of the mA and/or kV according to patient size and/or use of iterative reconstruction technique. CONTRAST:  142mL OMNIPAQUE IOHEXOL 350 MG/ML SOLN COMPARISON:  01/17/2022 FINDINGS: VASCULAR Aorta: Mild atherosclerotic changes of the abdominal aorta. No aneurysm. No dissection. No ulcerated  plaque or pedunculated plaque. Celiac: Celiac artery patent without significant atherosclerosis. SMA: SMA patent without significant atherosclerosis. Renals: - Right: Mild atherosclerosis at the origin of the right renal artery. Irregular beading in the mid and distal segment of the right renal artery. No aneurysm. - Left: Mild atherosclerotic changes at the origin of the left renal artery. Irregular beading in the mid segment of the left renal artery. IMA: Inferior mesenteric artery is patent. Right lower extremity: Unremarkable course, caliber, and contour of the right iliac system. Mild atherosclerotic changes. No aneurysm, dissection, or occlusion. Hypogastric artery is patent. Common femoral artery patent. Proximal SFA and profunda femoris patent. Left lower extremity: Unremarkable course, caliber, and contour of the left iliac system. Mild atherosclerotic changes. No aneurysm, dissection, or occlusion. Hypogastric artery is patent. Common femoral artery patent. Proximal SFA and profunda femoris patent. Venous: Portal venous: Hepatic veins patent with typical arrangement of the right middle and left hepatic veins. There is an accessory right hepatic vein to segment 6. Portal vein patent as well as the right and left portal vein branches. No intraluminal thrombus. Splenic vein patent. SMV and IMV patent. The left gastric vein/cardiac vein contributes to esophageal varices in the lower mediastinum. CT negative for evidence of gastric varices. There is no significant spleno gastro renal shunt. Systemic venous: Unremarkable hepatic IVC.  Unremarkable abdominal IVC. Unremarkable bilateral renal veins. Right iliac venous: The right common iliac vein and external iliac vein are patent. No evidence of thrombus. The right internal iliac veins are patent. No significant stenosis of the right common iliac vein. The visualized proximal femoral veins appear patent. Left iliac venous: The left common iliac vein is somewhat  compressed by the overlying right iliac arterial system in a typical configuration with no significant stenosis identified or evidence of thrombus. Left common iliac vein, external iliac vein, and the left internal iliac veins appear patent. The visualized proximal left femoral veins appear patent. Review of the MIP images confirms the above findings. NON-VASCULAR Lower chest: No acute finding of the lower chest. Atelectasis/scarring lower chest. Hepatobiliary: Nodular contour of the liver, with enlarged caudate relative to the decreasing size of the remainder of the liver compatible with cirrhosis. Unremarkable gallbladder. Pancreas: Unremarkable Spleen: Diameter of the spleen on the axial images at least 11 cm. Adrenals/Urinary Tract: - Right adrenal gland: Unremarkable - Left adrenal gland: Unremarkable. - Right kidney: No hydronephrosis, nephrolithiasis, inflammation, or ureteral dilation. No focal lesion. - Left Kidney: No hydronephrosis, nephrolithiasis, inflammation, or ureteral dilation. No focal lesion. - Urinary Bladder: Unremarkable. Stomach/Bowel: - Stomach: Negative for hemorrhage. Global edema of the gastric wall. No focality. Negative for gastric varices. - Small bowel: Diffuse small bowel wall circumferential thickening/edema. No abnormal distension. No air-fluid levels. Enhancement of the mucosa maintained. - Appendix: Appendix is not visualized, however, no inflammatory changes are present adjacent to the cecum to indicate an appendicitis. - Colon: Mild edema and colon wall thickening. Enhancement of the mucosa maintained. Lymphatic: No adenopathy. Mesenteric:  Small to moderate volume ascites. Reproductive: Hysterectomy Other: Body wall edema/anasarca Musculoskeletal: No acute displaced fracture. No bony canal narrowing. IMPRESSION: CT angiogram confirms the presence of esophageal varices, with no evidence of gastric varices. Negative for significant spleno gastro renal shunt. Additional stigmata  of cirrhosis and portal hypertension with small-moderate volume ascites and splenomegaly. Diffuse bowel wall edema/thickening involving the stomach, small bowel, and colon, with the mucosal enhancement maintained. Enteropathy of protein loss would be a leading differential, however, other inflammatory or infectious enteritis could also have this appearance. Aortic Atherosclerosis (ICD10-I70.0). Additional ancillary findings as above. Signed, Dulcy Fanny. Nadene Rubins, RPVI [facility] Electronically Signed   By: Corrie Mckusick D.O.   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 Failed Hep C treatment with Interferon in the 1990's.  Hep C RNA quant 1,200,000. AFP 102.9. MR liver showed cirrhosis, no hepatoma. ANA positive. IgG 2,430. Normal SMA. S/P paracentesis 01/22/2022 (6L of peritoneal fluid removed). No evidence of SBP and cytology was negative for malignancy. No overt hepatic encephalopathy, but nurse reports maybe some mild confusion today.  Ammonia level is 52.  Had another paracentesis on 3/15 with 8 Liters removed.   Cell count from 3/15 c/w SBP.  Is on Rocephin.   Hematemesis:  Hgb 10.5 grams down from 13.5-14.5 grams baseline.  This was after 2 units PRBCs.  Hgb stable at 8.4 grams.  No further sign of bleeding.   EGD 3/14: - No gross lesions in the proximal esophagus. - Grade III esophageal varices oozing blood. Completely eradicated. Banded. - Z-line irregular, 35 cm from the incisors. - 2 cm hiatal hernia. - Clotted blood in the cardia, in the gastric fundus and in the gastric body. - Portal hypertensive gastropathy. - No gross lesions in the duodenal bulb, in the first portion of the duodenum and in the second portion of the duodenum.   Not deemed to be candidate for elective TIPS  at this time per IR.  They recommended continuing to trend her labs and MELD score and order CT scan BRTO for reassessment by them early next week.  They will reassess based on these findings.   Coagulopathy. INR 1.8   Thrombocytopenia:  Platelets 61   Hyponatremia:  Na+ 131  MELD 3.0: 25 at 03/23/2022  9:22 AM MELD-Na: 24 at 03/23/2022  9:22 AM Calculated from: Serum Creatinine: 0.69 mg/dL (Using min of 1 mg/dL) at 03/23/2022  9:22 AM Serum Sodium: 131 mmol/L at 03/23/2022  9:22 AM Total Bilirubin: 6.5 mg/dL at 03/23/2022  9:22 AM Serum Albumin: 2.9 g/dL at 03/23/2022  9:22 AM INR(ratio): 1.8 at 03/21/2022  5:45 AM Age at listing (hypothetical): 62 years Sex: Female at 03/23/2022  9:22 AM   -PPI gtt for 72 hours from admission then IV BID. -Octreotide gtt for 72 hours from admission. -Continue Rocephin. -Carafate slurry BID. -Lactulose 20 grams TID has been ordered.  Titrate to 2-3 soft stools daily. -Will need another diagnostic paracentesis on 3/18 to be sure cell count is trending down.  I have ordered this for max 6 liters removed as she feels that her abdomen is already filling up with fluid again. -Albumin 100 grams was given 3/15 and and should receive another 75 grams today (orders are in). -[place] for regular diet today.   LOS: 4 days   Kathy Little  D. Ivon Roedel  03/23/2022, 12:19 PM

## 2022-03-23 NOTE — Progress Notes (Signed)
Pt with scant amount of dark red/maroon blood from rectum. Alfonse Spruce, DO, was notified.   Justice Rocher, RN

## 2022-03-23 NOTE — Progress Notes (Signed)
HD#4 Subjective:  Overnight Events: No events  O x 4. States that she feels better and clear headed, although has been doing some things out of character like calling her aunt and speaking jibberish. No BM in the past day she says. No N/V. No bleeding episodes. Feels like fluid already re- accumulated in her abdomen. Up and ambulating well.  Objective:  Vital signs in last 24 hours: Vitals:   03/23/22 0347 03/23/22 0500 03/23/22 0706 03/23/22 1102  BP: 113/68  110/65 122/68  Pulse: 71  70 76  Resp: 14  20 20   Temp: (!) 97.5 F (36.4 C)  (!) 97.5 F (36.4 C)   TempSrc: Axillary  Oral Oral  SpO2: 91%  96% 97%  Weight:  65.9 kg    Height:       Supplemental O2: Room Air SpO2: 97 %   Physical Exam:  Physical Exam: [id number] ill appearing, no acute distress, laying in bed cooperatively HEENT:scleral icterus Pulm:normal wob, LCTAB [id number], regular rhythm, no m/r/g, 2+ bilateral radial and DP pulses Abd/GI:firmer, distended, centrally tympanitic to percussion, ruq ptp, no rebound or guarding Extremities/skin: warm, 1+ LE edema bilaterally just above the ankles,jaundiced Neuro: alert and oriented x4, mild asterixis   Filed Weights   03/21/22 0500 03/22/22 0436 03/23/22 0500  Weight: 70.4 kg 62.8 kg 65.9 kg     Intake/Output Summary (Last 24 hours) at 03/23/2022 1132 Last data filed at 03/23/2022 0626 Gross per 24 hour  Intake 727.21 ml  Output --  Net 727.21 ml    Net IO Since Admission: 3,762.69 mL [03/23/22 1132]  Pertinent Labs:    Latest Ref Rng & Units 03/23/2022    9:22 AM 03/22/2022    6:16 PM 03/22/2022    7:06 AM  CBC  WBC 4.0 - 10.5 K/uL 1.9  2.9  2.4   Hemoglobin 12.0 - 15.0 g/dL 8.4  8.7  7.5   Hematocrit 36.0 - 46.0 % 25.5  25.3  22.5   Platelets 150 - 400 K/uL 69  76  61        Latest Ref Rng & Units 03/23/2022    9:22 AM 03/22/2022    6:16 PM 03/22/2022    7:06 AM  CMP  Glucose 70 - 99 mg/dL 137  102  125   BUN 8 - 23 mg/dL 14   16  19    Creatinine 0.44 - 1.00 mg/dL 0.69  0.72  0.83   Sodium 135 - 145 mmol/L 131  132  132   Potassium 3.5 - 5.1 mmol/L 3.6  3.7  3.5   Chloride 98 - 111 mmol/L 107  106  105   CO2 22 - 32 mmol/L 18  21  21    Calcium 8.9 - 10.3 mg/dL 8.1  8.1  8.1   Total Protein 6.5 - 8.1 g/dL 5.1  5.1  5.0   Total Bilirubin 0.3 - 1.2 mg/dL 6.5  6.1  5.8   Alkaline Phos 38 - 126 U/L 65  72  68   AST 15 - 41 U/L 147  210  241   ALT 0 - 44 U/L 94  118  121     Imaging: No results found.  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 is a 62 y/o female with a pmh of cirrhosis 2/2 to untreated HCV, esophageal varices, and ascites who presented with bleed esophageal varices now s/p banding and LVP.   Bleeding esophageal varices Patient with no evidence of further bleeding, hemoglobin stable from 8.7 to 8.4.  She underwent EGD with 5  Patient was not a candidate for elective TIPS upon evaluation 3/15 by IR given her high bilirubin at that time and hx of encephalopathy. T bili continues to be high at 6.5.Will reevaluate her on Monday. Will hold of on midodrine weaning as the patient may get another LVP tomorrow and do not want to drop her BP with fluid shifts. -Protonix infusion and octreotide (day 3/3) -Continue sucralfate -Continue midodrine for now.  Can titrate off if blood pressure allows after paracentesis 3/18. -CBC twice daily -Transfuse if hemoglobin < 7  Decompensated cirrhosis Untreated hep C Poor prognosis overall with multiple complication, cirrhosis including esophageal varices, ascites, SBP and cephalopathy.  MELD-NA  is 25 and she is child Pugh class C, very high risk for abdominal procedure.  Patient will need to follow-up with her hepatologist and possible transplant continue with curative.  Will need to follow-up with ID office for her hep C treatment as well. -daily CMP  SBPs/p LVP Ascites fluid showed 1000 cell count with 78% neutrophil counts, with SBP.  This is a first SBP episode.  Given she is on IV ceftriaxone.  Blood cultures negative to date.  GI recommended to rule a repeat paracentesis on 3/18 to look for response of antibiotic. -Follow ascitic culture -Continue albumin protocol per GI, 75 g today -ceftriaxone 1g IV x 7 days(day 4/7)  Hepatic encephalopathy Her mental status is stable today, alert and oriented x4. Endorses some out of character behavior. Having some memory difficulty.  No recorded BM over past 2 days, patient also endorsing no BM. -Lactulose increase to 20 g TID  Thrombocytopenia Platelet is 69 today, trended down from 110 since admission.  Smear with no evidence of hemolysis or abnormal morphology, most likely from portal hypertension and liver disease. -Trend CBC  Leukopenia Chronic leukopenia, no neutropenia. Will ctm. -Trend CBC  Diet:  full liquid IVF: None,None VTE: None SCD Code: Full PT/OT recs: None, none. TOC recs: NA  Dispo: Anticipated discharge to Home in >2 days pending IV PPI and TIPS eval.   Iona Coach, MD 03/23/2022, 11:32 AM Pager: [phone]  Please contact the on call pager after 5 pm and on weekends at [phone].

## 2022-03-24 ENCOUNTER — Encounter: Payer: Medicaid Other | Admitting: Obstetrics and Gynecology

## 2022-03-24 ENCOUNTER — Inpatient Hospital Stay (HOSPITAL_COMMUNITY): Payer: Medicaid Other

## 2022-03-24 DIAGNOSIS — K7290 Hepatic failure, unspecified without coma: Secondary | ICD-10-CM | POA: Diagnosis not present

## 2022-03-24 DIAGNOSIS — R188 Other ascites: Secondary | ICD-10-CM | POA: Diagnosis not present

## 2022-03-24 DIAGNOSIS — K7460 Unspecified cirrhosis of liver: Secondary | ICD-10-CM | POA: Diagnosis not present

## 2022-03-24 DIAGNOSIS — I8511 Secondary esophageal varices with bleeding: Secondary | ICD-10-CM | POA: Diagnosis not present

## 2022-03-24 DIAGNOSIS — K652 Spontaneous bacterial peritonitis: Secondary | ICD-10-CM | POA: Diagnosis not present

## 2022-03-24 DIAGNOSIS — K7682 Hepatic encephalopathy: Secondary | ICD-10-CM | POA: Diagnosis not present

## 2022-03-24 HISTORY — PX: IR PARACENTESIS: IMG2679

## 2022-03-24 LAB — CULTURE, BLOOD (ROUTINE X 2)
Culture: NO GROWTH
Culture: NO GROWTH

## 2022-03-24 LAB — BODY FLUID CELL COUNT WITH DIFFERENTIAL
Eos, Fluid: 0 %
Lymphs, Fluid: 35 %
Monocyte-Macrophage-Serous Fluid: 39 % — ABNORMAL LOW (ref 50–90)
Neutrophil Count, Fluid: 26 % — ABNORMAL HIGH (ref 0–25)
Total Nucleated Cell Count, Fluid: 69 cu mm (ref 0–1000)

## 2022-03-24 LAB — COMPREHENSIVE METABOLIC PANEL
ALT: 73 U/L — ABNORMAL HIGH (ref 0–44)
AST: 107 U/L — ABNORMAL HIGH (ref 15–41)
Albumin: 3.2 g/dL — ABNORMAL LOW (ref 3.5–5.0)
Alkaline Phosphatase: 76 U/L (ref 38–126)
Anion gap: 5 (ref 5–15)
BUN: 11 mg/dL (ref 8–23)
CO2: 21 mmol/L — ABNORMAL LOW (ref 22–32)
Calcium: 8.1 mg/dL — ABNORMAL LOW (ref 8.9–10.3)
Chloride: 103 mmol/L (ref 98–111)
Creatinine, Ser: 0.62 mg/dL (ref 0.44–1.00)
GFR, Estimated: >60 mL/min (ref >60)
Glucose, Bld: 109 mg/dL — ABNORMAL HIGH (ref 70–99)
Potassium: 3.7 mmol/L (ref 3.5–5.1)
Sodium: 129 mmol/L — ABNORMAL LOW (ref 135–145)
Total Bilirubin: 7.6 mg/dL — ABNORMAL HIGH (ref 0.3–1.2)
Total Protein: 5.4 g/dL — ABNORMAL LOW (ref 6.5–8.1)

## 2022-03-24 LAB — CBC
HCT: 26.6 % — ABNORMAL LOW (ref 36.0–46.0)
Hemoglobin: 8.9 g/dL — ABNORMAL LOW (ref 12.0–15.0)
MCH: 33.6 pg (ref 26.0–34.0)
MCHC: 33.5 g/dL (ref 30.0–36.0)
MCV: 100.4 fL — ABNORMAL HIGH (ref 80.0–100.0)
Platelets: 74 10*3/uL — ABNORMAL LOW (ref 150–400)
RBC: 2.65 MIL/uL — ABNORMAL LOW (ref 3.87–5.11)
RDW: 22.9 % — ABNORMAL HIGH (ref 11.5–15.5)
WBC: 2.5 10*3/uL — ABNORMAL LOW (ref 4.0–10.5)
nRBC: 0 % (ref 0.0–0.2)

## 2022-03-24 LAB — GLUCOSE, CAPILLARY: Glucose-Capillary: 117 mg/dL — ABNORMAL HIGH (ref 70–99)

## 2022-03-24 MED ORDER — PANTOPRAZOLE SODIUM 40 MG PO TBEC
40.0000 mg | DELAYED_RELEASE_TABLET | Freq: Two times a day (BID) | ORAL | Status: DC
  Administered 2022-03-24 – 2022-03-25 (×3): 40 mg via ORAL
  Filled 2022-03-24 (×3): qty 1

## 2022-03-24 MED ORDER — LIDOCAINE HCL 1 % IJ SOLN
INTRAMUSCULAR | Status: AC
  Administered 2022-03-24: 10 mL
  Filled 2022-03-24: qty 20

## 2022-03-24 MED ORDER — LACTULOSE 10 GM/15ML PO SOLN
10.0000 g | Freq: Three times a day (TID) | ORAL | Status: DC
  Administered 2022-03-24 – 2022-03-25 (×4): 10 g via ORAL
  Filled 2022-03-24 (×4): qty 30

## 2022-03-24 NOTE — Progress Notes (Signed)
HD#5 Subjective:  Overnight Events: No events  Patient reports that her thoughts are better this morning. She does not feel confused and is sleeping okay but has to get up to use the bathroom a lot. She is having abdominal pain. She denies any blood in her stool. She denies any nausea or vomiting. She reports that her feet her numb and tingling today. Explained the plan to the patient and answered her questions.   Objective:  Vital signs in last 24 hours: Vitals:   03/23/22 2327 03/24/22 0308 03/24/22 0500 03/24/22 0708  BP: 111/67 101/64  113/69  Pulse: 71 74  83  Resp: 18 20  (!) 21  Temp: 98.5 F (36.9 C) 98.6 F (37 C)  98.4 F (36.9 C)  TempSrc: Axillary Oral  Oral  SpO2: 94% 91%  92%  Weight:   69.1 kg   Height:       Supplemental O2: Room Air SpO2: 92 %   Physical Exam:  Physical Exam: [id number] ill appearing, no acute distress, laying in bed cooperatively HEENT:scleral icterus Pulm:normal wob, LCTAB [id number], regular rhythm, no m/r/g, 2+ bilateral radial and DP pulses Abd/GI:firmer, distended, centrally tympanitic to percussion, ruq ptp, no rebound or guarding Extremities/skin: warm, 1+ LE edema bilaterally just above the ankles,jaundiced Neuro: alert and oriented x4, no asterixis   Filed Weights   03/22/22 0436 03/23/22 0500 03/24/22 0500  Weight: 62.8 kg 65.9 kg 69.1 kg     Intake/Output Summary (Last 24 hours) at 03/24/2022 1141 Last data filed at 03/24/2022 0944 Gross per 24 hour  Intake 599.98 ml  Output --  Net 599.98 ml    Net IO Since Admission: 4,460.75 mL [03/24/22 1141]  Pertinent Labs:    Latest Ref Rng & Units 03/24/2022    8:25 AM 03/23/2022    9:22 AM 03/22/2022    6:16 PM  CBC  WBC 4.0 - 10.5 K/uL 2.5  1.9  2.9   Hemoglobin 12.0 - 15.0 g/dL 8.9  8.4  8.7   Hematocrit 36.0 - 46.0 % 26.6  25.5  25.3   Platelets 150 - 400 K/uL 74  69  76        Latest Ref Rng & Units 03/24/2022    8:25 AM 03/23/2022    9:22 AM  03/22/2022    6:16 PM  CMP  Glucose 70 - 99 mg/dL 109  137  102   BUN 8 - 23 mg/dL 11  14  16    Creatinine 0.44 - 1.00 mg/dL 0.62  0.69  0.72   Sodium 135 - 145 mmol/L 129  131  132   Potassium 3.5 - 5.1 mmol/L 3.7  3.6  3.7   Chloride 98 - 111 mmol/L 103  107  106   CO2 22 - 32 mmol/L 21  18  21    Calcium 8.9 - 10.3 mg/dL 8.1  8.1  8.1   Total Protein 6.5 - 8.1 g/dL 5.4  5.1  5.1   Total Bilirubin 0.3 - 1.2 mg/dL 7.6  6.5  6.1   Alkaline Phos 38 - 126 U/L 76  65  72   AST 15 - 41 U/L 107  147  210   ALT 0 - 44 U/L 73  94  118     Imaging: No results found.  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 Prak is a 62 y/o female with a pmh of cirrhosis 2/2 to untreated HCV, esophageal varices, and ascites who presented with bleed esophageal varices now s/p banding and LVP Found to have SBP, scheduled for repeat paracentesis today.   Bleeding esophageal varices Patient with no evidence of further bleeding, hemoglobin stable from 8.4 to 8.9.  She underwent EGD with 5  Patient was not a candidate for elective TIPS upon evaluation 3/15 by IR given her high bilirubin at that time and hx of encephalopathy. Bili persistently high to 7.6.Will continue TIPS evaluation by IR outpatient. Will hold of on midodrine weaning given LVP today. Will stop octreotide and Protonix infusion. Can follow up with GI about need for beta blocker given banding of varices. -start protonix 40mg  BID PO -Continue sucralfate -Continue midodrine for now.  Can titrate off if blood pressure allows after paracentesis -CBC daily -Transfuse if  hemoglobin < 7  SBPs/p LVP Ascites fluid showed 1000 cell count with 78% neutrophil counts, with SBP.  This is a first SBP episode.  Blood cultures negative to date.Paracentesis today to repeat SBP labs, will need to be on ceftriaxone until PMN<250. If large volume will replace albumin as needed. -Follow ascitic culture -Continue albumin protocol per GI -ceftriaxone 1g IV   Hepatic encephalopathy Her mental status is stable today, alert and oriented x4. No asterixis.6 bowel movements yesterday, decreasing lactulose dose. -Lactulose decrease to 10 g TID  Decompensated cirrhosis Untreated hep C Poor prognosis overall with multiple complication, cirrhosis including esophageal varices, ascites, SBP and cephalopathy.  MELD-NA is 26 and slowly rising throughout admission. She is child Pugh class C, very high risk for abdominal procedure.  Patient will need to follow-up with her hepatologist and possible transplant continue with curative.  Will need to follow-up with ID office for her hep C treatment as well. -daily CMP  Thrombocytopenia Platelet is 74 today, trended down from 110 since admission.  Smear with no evidence of hemolysis or abnormal morphology, most likely from portal hypertension and liver disease. -Trend CBC  Leukopenia Chronic leukopenia, no neutropenia. Will ctm. -Trend CBC  Diet:  full liquid IVF: None,None VTE: None SCD Code: Full PT/OT recs: None, none. TOC recs: NA  Dispo: Anticipated discharge to Home in 2 days  Iona Coach, MD 03/24/2022, 11:41 AM Pager: [phone]  Please contact the on call pager after 5 pm and on weekends at [phone].

## 2022-03-24 NOTE — Procedures (Signed)
PROCEDURE SUMMARY:  Successful ultrasound guided paracentesis from the left lower quadrant.  Yielded 4.4 of bright yellow fluid.  No immediate complications.  The patient tolerated the procedure well.   Specimen was sent for labs.  EBL < 52mL  The patient has required >/=2 paracenteses in a 30 day period and a screening evaluation by the [facility] has been arranged.

## 2022-03-24 NOTE — Progress Notes (Addendum)
Progress Note  Primary GI: Dr. Hilarie Fredrickson   Subjective  Chief Complaint: Hematemesis   No family was present at the time of my evaluation. Patient states her abdomen feels very swollen and tight, she is due for recheck paracentesis today. Patient started on lactulose and has been having bowel movements all evening.  Denies any dark black stool or blood in the stool.   States she has less confusion today. Ate breakfast this morning without any nausea, vomiting. Denies fever or chills    Objective   Vital signs in last 24 hours: Temp:  [98.3 F (36.8 C)-98.6 F (37 C)] 98.4 F (36.9 C) (03/18 0708) Pulse Rate:  [71-85] 83 (03/18 0708) Resp:  [15-21] 21 (03/18 0708) BP: (101-117)/(64-69) 113/69 (03/18 0708) SpO2:  [91 %-94 %] 92 % (03/18 0708) Weight:  [69.1 kg] 69.1 kg (03/18 0500) Last BM Date : 03/24/22 Last BM recorded by nurses in past 5 days Stool Type: Type 6 (Mushy consistency with ragged edges) (03/24/2022  2:57 AM)  General:  Alert, Well-developed, in NAD Heart:  Regular rate and rhythm; no murmurs Pulm:  CTAB.  No W/R/R. Abdomen:  Soft, distended with ascites fluid.  BS present.  Slight tenderness. Extremities:  Without edema. Neurologic:  Alert and oriented x 4;  grossly normal neurologically.  No asterixis Psych:  Alert and cooperative. Normal mood and affect.  Intake/Output from previous day: 03/17 0701 - 03/18 0700 In: 198.1 [I.V.:198.1] Out: -  Intake/Output this shift: Total I/O In: 500 [P.O.:500] Out: -   Studies/Results: No results found.  Lab Results: Recent Labs    03/22/22 1816 03/23/22 0922 03/24/22 0825  WBC 2.9* 1.9* 2.5*  HGB 8.7* 8.4* 8.9*  HCT 25.3* 25.5* 26.6*  PLT 76* 69* 74*   BMET Recent Labs    03/22/22 1816 03/23/22 0922 03/24/22 0825  NA 132* 131* 129*  K 3.7 3.6 3.7  CL 106 107 103  CO2 21* 18* 21*  GLUCOSE 102* 137* 109*  BUN 16 14 11   CREATININE 0.72 0.69 0.62  CALCIUM 8.1* 8.1* 8.1*   LFT Recent Labs     03/24/22 0825  PROT 5.4*  ALBUMIN 3.2*  AST 107*  ALT 73*  ALKPHOS 76  BILITOT 7.6*   PT/INR No results for input(s): "LABPROT", "INR" in the last 72 hours.   Scheduled Meds:  sodium chloride   Intravenous Once   lactulose  10 g Oral TID   midodrine  15 mg Oral TID WC   sucralfate  1 g Oral BID   Continuous Infusions:  cefTRIAXone (ROCEPHIN)  IV 1 g (03/23/22 1739)   octreotide (SANDOSTATIN) 500 mcg in sodium chloride 0.9 % 250 mL (2 mcg/mL) infusion 50 mcg/hr (03/24/22 0126)      Patient profile:    62 y.o. female with a past medical history of recurrent cervical cancer s/p subtotal hysterectomy 2012 treated with brachytherapy 2016, chronic leukopenia, thrombocytopenia, chronic hepatitis C gentoype 1a who was recently diagnosed with decompensated cirrhosis, esophageal varices, presents hematemesis.    Impression/Plan:   Decompensated cirrhosis secondary to chronic hepatitis C genotype Ia Failed Hep C treatment with Interferon in the 1990's.  Hep C RNA quant 1,200,000.  AFP 102.9. MR liver showed cirrhosis, no hepatoma.  ANA positive. IgG 2,430. Normal SMA.   WBC 2.5 HGB 8.9 Platelets 74 AST 107 ALT 73  Alkphos 76 TBili 7.6 INR 03/21/2022 1.8  MELD 3.0: 25 at 03/23/2022  9:22 AM MELD-Na: 24 at 03/23/2022  9:22 AM  Calculated from: Serum Creatinine: 0.69 mg/dL (Using min of 1 mg/dL) at 03/23/2022  9:22 AM Serum Sodium: 131 mmol/L at 03/23/2022  9:22 AM Total Bilirubin: 6.5 mg/dL at 03/23/2022  9:22 AM Serum Albumin: 2.9 g/dL at 03/23/2022  9:22 AM INR(ratio): 1.8 at 03/21/2022  5:45 AM Age at listing (hypothetical): 62 years Sex: Female at 03/23/2022  9:22 AM - Serial INR, CBC, CMET daily. - Daily MELD  Ascites 03/15 paracentesis 8 L removed, cell count with SBP, on rocephin Getting repeat paracentesis today to evaluate PMN for SBP Will likely need albumin with greater than 4 to 5 L removed. Most likely patient would benefit from long-term antibiotics, high risk for  recurrence. Patient on midodrine, not currently on any Lasix or spironolactone due to hyponatremia Sodium 129, creatinine and BUN stable, question when to restart diuretics versus TIPS.  Esophageal Varices with suspected bleed 03/20/2022 No gross lesions in the proximal esophagus. - Grade III esophageal varices oozing blood. Completely eradicated. Banded. - Z-line irregular, 35 cm from the incisors. - 2 cm hiatal hernia. - Clotted blood in the cardia, in the gastric fundus and in the gastric body. - Portal hypertensive gastropathy. - No gross lesions in the duodenal bulb, in the first portion of the duodenum and in the second portion of the duodenum.  - On protonix and octreotide -IV Ceftriaxone 2g x at least 5 days total  -Per IR not candidate for elective TIPS at this time, will continue to trend labs/MELD  -CT BRTO protocol 03/21/2022 confirms esophageal varices but no gastric varices.  Negative for significant spleen no gastrorenal shunt.  Portal hypertension small and moderate volume ascites and splenomegaly.  Diffuse bowel wall thickening likely enteropathy protein loss.   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 I performed a substantive portion of this encounter, including complete performance of at least one of the key components, in conjunction with the APP. I agree with the APP's note, impression and recommendations.   S/p paracentesis with removal of 4 L fluid.  Ascites fluid analysis PMN less than 250.  Continue IV ceftriaxone while inpatient, switch to oral antibiotics Augmentin 875 mg twice daily to complete additional 4 days for 10-day course After completion of Augmentin start Bactrim 1 double strength tablet daily for SBP secondary prophylaxis  Continue lactulose and rifaximin for hepatic encephalopathy  Esophageal variceal hemorrhage s/p band ligation.  Need surveillance EGD in 3-4 weeks Not a candidate for TIPS due to high MELD  Will need repeat labs next week, will plan to arrange through GI office.  CBC, CMP, PT and INR Follow-up with Dr. Hilarie Fredrickson or APP  in GI office in 2 weeks.  Okay to discharge home tomorrow from GI standpoint   The patient was provided an opportunity to ask questions and all were answered. The patient agreed with the plan and demonstrated an understanding of the instructions.   Damaris Hippo , MD [phone]

## 2022-03-25 ENCOUNTER — Other Ambulatory Visit (HOSPITAL_COMMUNITY): Payer: Self-pay

## 2022-03-25 ENCOUNTER — Telehealth (HOSPITAL_COMMUNITY): Payer: Self-pay | Admitting: Pharmacy Technician

## 2022-03-25 DIAGNOSIS — R188 Other ascites: Secondary | ICD-10-CM | POA: Diagnosis not present

## 2022-03-25 DIAGNOSIS — I8511 Secondary esophageal varices with bleeding: Secondary | ICD-10-CM | POA: Diagnosis not present

## 2022-03-25 DIAGNOSIS — K7682 Hepatic encephalopathy: Secondary | ICD-10-CM | POA: Diagnosis not present

## 2022-03-25 DIAGNOSIS — K7460 Unspecified cirrhosis of liver: Secondary | ICD-10-CM | POA: Diagnosis not present

## 2022-03-25 LAB — CBC
HCT: 26.8 % — ABNORMAL LOW (ref 36.0–46.0)
Hemoglobin: 8.9 g/dL — ABNORMAL LOW (ref 12.0–15.0)
MCH: 33.2 pg (ref 26.0–34.0)
MCHC: 33.2 g/dL (ref 30.0–36.0)
MCV: 100 fL (ref 80.0–100.0)
Platelets: 74 10*3/uL — ABNORMAL LOW (ref 150–400)
RBC: 2.68 MIL/uL — ABNORMAL LOW (ref 3.87–5.11)
RDW: 23.6 % — ABNORMAL HIGH (ref 11.5–15.5)
WBC: 2.5 10*3/uL — ABNORMAL LOW (ref 4.0–10.5)
nRBC: 0.8 % — ABNORMAL HIGH (ref 0.0–0.2)

## 2022-03-25 LAB — COMPREHENSIVE METABOLIC PANEL
ALT: 61 U/L — ABNORMAL HIGH (ref 0–44)
AST: 86 U/L — ABNORMAL HIGH (ref 15–41)
Albumin: 2.9 g/dL — ABNORMAL LOW (ref 3.5–5.0)
Alkaline Phosphatase: 77 U/L (ref 38–126)
Anion gap: 6 (ref 5–15)
BUN: 12 mg/dL (ref 8–23)
CO2: 21 mmol/L — ABNORMAL LOW (ref 22–32)
Calcium: 7.9 mg/dL — ABNORMAL LOW (ref 8.9–10.3)
Chloride: 106 mmol/L (ref 98–111)
Creatinine, Ser: 0.62 mg/dL (ref 0.44–1.00)
GFR, Estimated: >60 mL/min (ref >60)
Glucose, Bld: 145 mg/dL — ABNORMAL HIGH (ref 70–99)
Potassium: 4 mmol/L (ref 3.5–5.1)
Sodium: 133 mmol/L — ABNORMAL LOW (ref 135–145)
Total Bilirubin: 6.7 mg/dL — ABNORMAL HIGH (ref 0.3–1.2)
Total Protein: 4.9 g/dL — ABNORMAL LOW (ref 6.5–8.1)

## 2022-03-25 LAB — PROTIME-INR
INR: 1.9 — ABNORMAL HIGH (ref 0.8–1.2)
Prothrombin Time: 21.8 seconds — ABNORMAL HIGH (ref 11.4–15.2)

## 2022-03-25 LAB — CYTOLOGY - NON PAP

## 2022-03-25 LAB — PATHOLOGIST SMEAR REVIEW

## 2022-03-25 LAB — GLUCOSE, CAPILLARY: Glucose-Capillary: 124 mg/dL — ABNORMAL HIGH (ref 70–99)

## 2022-03-25 MED ORDER — RIFAXIMIN 550 MG PO TABS
550.0000 mg | ORAL_TABLET | Freq: Two times a day (BID) | ORAL | Status: DC
  Administered 2022-03-25: 550 mg via ORAL
  Filled 2022-03-25 (×2): qty 1

## 2022-03-25 MED ORDER — PANTOPRAZOLE SODIUM 40 MG PO TBEC
40.0000 mg | DELAYED_RELEASE_TABLET | Freq: Two times a day (BID) | ORAL | 0 refills | Status: DC
  Filled 2022-03-25: qty 60, 30d supply, fill #0

## 2022-03-25 MED ORDER — SULFAMETHOXAZOLE-TRIMETHOPRIM 800-160 MG PO TABS
1.0000 | ORAL_TABLET | Freq: Every day | ORAL | 0 refills | Status: DC
  Filled 2022-03-25: qty 30, 30d supply, fill #0

## 2022-03-25 MED ORDER — SUCRALFATE 1 GM/10ML PO SUSP
1.0000 g | Freq: Two times a day (BID) | ORAL | 0 refills | Status: DC
  Filled 2022-03-25: qty 600, 30d supply, fill #0

## 2022-03-25 MED ORDER — LACTULOSE 10 GM/15ML PO SOLN
10.0000 g | Freq: Three times a day (TID) | ORAL | 0 refills | Status: DC
  Filled 2022-03-25: qty 1419, 32d supply, fill #0

## 2022-03-25 MED ORDER — FUROSEMIDE 20 MG PO TABS
20.0000 mg | ORAL_TABLET | Freq: Every day | ORAL | 0 refills | Status: DC
  Filled 2022-03-25: qty 30, 30d supply, fill #0

## 2022-03-25 MED ORDER — MIDODRINE HCL 5 MG PO TABS
15.0000 mg | ORAL_TABLET | Freq: Three times a day (TID) | ORAL | 0 refills | Status: DC
  Filled 2022-03-25: qty 270, 30d supply, fill #0

## 2022-03-25 MED ORDER — RIFAXIMIN 550 MG PO TABS
550.0000 mg | ORAL_TABLET | Freq: Two times a day (BID) | ORAL | 0 refills | Status: DC
  Filled 2022-03-25: qty 60, 30d supply, fill #0

## 2022-03-25 MED ORDER — SPIRONOLACTONE 50 MG PO TABS
50.0000 mg | ORAL_TABLET | Freq: Every day | ORAL | 0 refills | Status: DC
  Filled 2022-03-25: qty 30, 30d supply, fill #0

## 2022-03-25 MED ORDER — AMOXICILLIN-POT CLAVULANATE 875-125 MG PO TABS
1.0000 | ORAL_TABLET | Freq: Two times a day (BID) | ORAL | 0 refills | Status: AC
  Filled 2022-03-25: qty 8, 4d supply, fill #0

## 2022-03-25 NOTE — Telephone Encounter (Signed)
Patient Advocate Encounter   Received notification that prior authorization for Xifaxan 550MG  tablets is required.   PA submitted on 03/25/2022 [place] Prior Authorization Form Status is pending       Lyndel Safe, [place] Patient Advocate Specialist [place] Patient Advocate Team Direct Number: [phone]  Fax: [phone]

## 2022-03-25 NOTE — Evaluation (Signed)
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 Repeat paracentesis 03/24/22.  Clinical Impression  Patient presents with mild balance deficits s/p above. Pt is independent with ADLs/IADLs and works PTA. Pt lives at home with family. Today, pt tolerated ambulation and stair training mod I-supervision for safety. Noted to have 2 instances of mild LOB but able to catch self without difficulty. HR up to 140 bpm post stairs needing 1 standing rest break due to SOB. Pt reports no pain and feeling well today. Continues to report swollen abdomen which is heavy. Pt is safe to d/c home from a mobility standpoint with support of family as needed. Functioning at [place] level. All education completed. Discharge from therapy.     Recommendations for follow up therapy are one component of a multi-disciplinary discharge planning process, led by the attending physician.  Recommendations may be updated based on patient status, additional functional criteria and insurance authorization.  Follow Up Recommendations No PT follow up      Assistance Recommended at Discharge PRN  Patient can return home with the following  Assist for transportation;Assistance with cooking/housework    Equipment Recommendations None recommended by PT  Recommendations for Other Services       Functional Status Assessment Patient has had a recent decline in their functional status and demonstrates the ability to make significant improvements in function in a reasonable and predictable amount of time.     Precautions / Restrictions Precautions Precautions: None Restrictions Weight Bearing Restrictions: No      Mobility  Bed Mobility                General bed mobility comments: up in chair upon PT arrival.    Transfers Overall transfer level: Independent Equipment used: None               General transfer comment: Stood from chair without difficulty or LOB.    Ambulation/Gait Ambulation/Gait assistance: Modified independent (Device/Increase time) Gait Distance (Feet): 250 Feet Assistive device: None Gait Pattern/deviations: Step-through pattern, Decreased stride length, Staggering left   Gait velocity interpretation: 1.31 - 2.62 ft/sec, indicative of limited community ambulator   General Gait Details: Slow, mostly steady gait with 2 instances of mild iimbalance but able to correct self. 1 standing rest breaks post stairs. HR up to 140 bpm.  Stairs Stairs: Yes Stairs assistance: Supervision Stair Management: One rail Left, Step to pattern Number of Stairs: 3 General stair comments: Cues for safety, mildly unsteady on the descent but no LOB.  Wheelchair Mobility    Modified Rankin (Stroke Patients Only)       Balance Overall balance assessment: Mild deficits observed, not formally tested                                           Pertinent Vitals/Pain Pain Assessment Pain Assessment: No/denies pain    Home Living Family/patient expects to be discharged to:: Private residence Living Arrangements: Spouse/significant other;Children Available Help at Discharge: Available PRN/intermittently;Family Type of Home: House Home Access: Stairs to enter Entrance Stairs-Rails: Right;Left;Can reach both Entrance Stairs-Number of Steps: no steps in back entrance in foyer/mud room/extra  room although once inside there are 2-3 steps to get into the home.   Home Layout: One level Home Equipment: None      Prior Function Prior Level of Function : Independent/Modified Independent;Working/employed             Mobility Comments: Does not drive. Works part time at [employer]. Does not use  any AD. ADLs Comments: independent     Hand Dominance   Dominant Hand: Left    Extremity/Trunk Assessment   Upper Extremity Assessment Upper Extremity Assessment: Defer to OT evaluation    Lower Extremity Assessment Lower Extremity Assessment: Overall WFL for tasks assessed    Cervical / Trunk Assessment Cervical / Trunk Assessment: Normal  Communication   Communication: No difficulties  Cognition Arousal/Alertness: Awake/alert Behavior During Therapy: WFL for tasks assessed/performed Overall Cognitive Status: Within Functional Limits for tasks assessed                                          General Comments General comments (skin integrity, edema, etc.): 2 instances of mild LOB but able to catch self without difficulty. HR up to 140 bpm post stairs.    Exercises     Assessment/Plan    PT Assessment Patient does not need any further PT services  PT Problem List         PT Treatment Interventions      PT Goals (Current goals can be found in the Care Plan section)  Acute Rehab PT Goals Patient Stated Goal: to go home and take a shower PT Goal Formulation: All assessment and education complete, DC therapy    Frequency       Co-evaluation               AM-PAC PT "6 Clicks" Mobility  Outcome Measure Help needed turning from your back to your side while in a flat bed without using bedrails?: None Help needed moving from lying on your back to sitting on the side of a flat bed without using bedrails?: None Help needed moving to and from a bed to a chair (including a wheelchair)?: None Help needed standing up from a chair using your arms (e.g., wheelchair or bedside chair)?: A Little Help needed to walk in hospital room?: A Little Help needed climbing 3-5 steps with a railing? : A Little 6 Click Score: 21    End of Session   Activity Tolerance: Patient tolerated treatment well Patient left: in chair;with call bell/phone within  reach Nurse Communication: Mobility status PT Visit Diagnosis: Unsteadiness on feet (R26.81);Difficulty in walking, not elsewhere classified (R26.2)    Time: [id number] PT Time Calculation (min) (ACUTE ONLY): 12 min   Charges:   PT Evaluation $PT Eval Low Complexity: 1 Low          Marisa Severin, PT, DPT Acute Rehabilitation Services Secure chat preferred Office [place] 03/25/2022, 10:02 AM

## 2022-03-25 NOTE — Evaluation (Signed)
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 Repeat paracentesis 03/24/22.   Clinical Impression   Patient evaluated by Occupational Therapy with no further acute OT needs identified. All education has been completed and the patient has no further questions. Prior to admit, pt was independent with all ADL tasks and functional mobility including working part time. Noted elevated HR while standing and completing ADL at sink. HR decreased once seated and resting. No follow up OT or DME recommended at discharge. OT is signing off. Thank you for this referral.       Recommendations for follow up therapy are one component of a multi-disciplinary discharge planning process, led by the attending physician.  Recommendations may be updated based on patient status, additional functional criteria and insurance authorization.   Follow Up Recommendations  No OT follow up     Assistance Recommended at Discharge None  Patient can return home with the following Assist for transportation       Equipment Recommendations  None recommended by OT       Precautions / Restrictions Precautions Precautions: None Restrictions Weight Bearing Restrictions: No      Mobility Bed Mobility Overal bed mobility: Independent   Patient Response: Cooperative  Transfers Overall transfer level: Independent Equipment used: None   General transfer comment: Provided Supervision during transfers and functional mobility initially to assess balance. Pt able to complete all functional mobility and transfers independently. No LOB.      Balance Overall balance assessment: Independent     ADL either performed or assessed with clinical  judgement   ADL Overall ADL's : Independent;At baseline         Vision Baseline Vision/History: 1 Wears glasses Ability to See in Adequate Light: 0 Adequate Vision Assessment?: No apparent visual deficits            Pertinent Vitals/Pain Pain Assessment Pain Assessment: No/denies pain     Hand Dominance Left   Extremity/Trunk Assessment Upper Extremity Assessment Upper Extremity Assessment: Overall WFL for tasks assessed   Lower Extremity Assessment Lower Extremity Assessment: Defer to PT evaluation   Cervical / Trunk Assessment Cervical / Trunk Assessment: Normal   Communication Communication Communication: No difficulties   Cognition Arousal/Alertness: Awake/alert Behavior During Therapy: WFL for tasks assessed/performed Overall Cognitive Status: Within Functional Limits for tasks assessed     General Comments  BP WNL. HR increased to 125 max while standing and complete ADL at sink level. Once seated in recliner, HR returned to 94-110.            Home Living Family/patient expects to be discharged to:: Private residence Living Arrangements: Spouse/significant other;Children (daughter, grandson, and Son) Available Help at Discharge: Available PRN/intermittently;Family Type of Home: House Home Access: Stairs to enter [employer] of Steps: no steps in back entrance in foyer/mud room/extra room although once inside there are 2-3 steps to get into the home. Entrance Stairs-Rails: Right;Left;Can reach both Home Layout: One level     Bathroom Shower/Tub: Teacher, early years/pre: Standard     Home Equipment: None          Prior Functioning/Environment Prior Level of Function : Independent/Modified Independent;Working/employed             Mobility Comments:  Does not drive. Works part time at [employer]. Does not use any AD. ADLs Comments: independent        OT Problem List: Decreased strength      OT  Treatment/Interventions:   Eval only   OT Goals(Current goals can be found in the care plan section) Acute Rehab OT Goals Patient Stated Goal: to go home  OT Frequency:  1 time visit       AM-PAC OT "6 Clicks" Daily Activity     Outcome Measure Help from another person eating meals?: None Help from another person taking care of personal grooming?: None Help from another person toileting, which includes using toliet, bedpan, or urinal?: None Help from another person bathing (including washing, rinsing, drying)?: None Help from another person to put on and taking off regular upper body clothing?: None Help from another person to put on and taking off regular lower body clothing?: None 6 Click Score: 24   End of Session Nurse Communication: Mobility status  Activity Tolerance: Patient tolerated treatment well Patient left: in chair;with call bell/phone within reach;Other (comment) (With medical staff.)  OT Visit Diagnosis: Muscle weakness (generalized) (M62.81)                Time: [id number] OT Time Calculation (min): 35 min Charges:  OT General Charges $OT Visit: 1 Visit OT Evaluation $OT Eval Low Complexity: 1 Low  Ailene Ravel, OTR/L,CBIS  Supplemental OT - [facility] and [facility] Secure Chat Preferred    Troyce Gieske, Clarene Duke 03/25/2022, 9:55 AM

## 2022-03-25 NOTE — TOC Benefit Eligibility Note (Signed)
Patient Teacher, English as a foreign language completed.    The patient is currently admitted and upon discharge could be taking Xifaxan 550 mg tablets.  Requires Prior Authorization  The patient is insured through [employer] and [place], [place] Patient Advocate Specialist [place] Patient Advocate Team Direct Number: [phone]  Fax: [phone]

## 2022-03-25 NOTE — Discharge Summary (Signed)
Name: Kathy Little MRN: [id number] DOB: 10/30/1960 62 y.o. PCP: Noralyn Pick, NP  Date of Admission: 03/19/2022  2:32 PM Date of Discharge: No discharge date for patient encounter. Attending Physician: Velna Ochs, MD  Discharge Diagnosis: 1. Principal Problem:   Esophageal varices with bleeding (HCC) Active Problems:   Decompensated hepatic cirrhosis (HCC)   Ascites   Portal hypertension (HCC)   Upper GI bleed   SBP (spontaneous bacterial peritonitis) ([place])   Cirrhosis of liver with ascites (HCC)   Encephalopathy, hepatic (HCC)   Liver cirrhosis secondary to NASH (HCC)   Abnormal LFTs    Discharge Medications: Allergies as of 03/25/2022       Reactions   Ampicillin Nausea And Vomiting   Flagyl [metronidazole] Nausea Only        Medication List     STOP taking these medications    omeprazole 40 MG capsule Commonly known as: PRILOSEC       TAKE these medications    acetaminophen 500 MG tablet Commonly known as: TYLENOL Take 1,000 mg by mouth daily as needed for moderate pain, fever or headache.   amoxicillin-clavulanate 875-125 MG tablet Commonly known as: AUGMENTIN Take 1 tablet by mouth 2 (two) times daily for 4 days.   furosemide 20 MG tablet Commonly known as: Lasix Take 1 tablet (20 mg total) by mouth daily. What changed:  medication strength how much to take   lactulose 10 GM/15ML solution Commonly known as: CHRONULAC Take 15 mLs (10 g total) by mouth 3 (three) times daily.   midodrine 5 MG tablet Commonly known as: PROAMATINE Take 3 tablets (15 mg total) by mouth 3 (three) times daily with meals.   pantoprazole 40 MG tablet Commonly known as: PROTONIX Take 1 tablet (40 mg total) by mouth 2 (two) times daily.   potassium chloride SA 20 MEQ tablet Commonly known as: KLOR-CON M Take 1 tablet (20 mEq total) by mouth daily.   rifaximin 550 MG Tabs tablet Commonly known as: XIFAXAN Take 1 tablet (550 mg total) by  mouth 2 (two) times daily.   spironolactone 50 MG tablet Commonly known as: Aldactone Take 1 tablet (50 mg total) by mouth daily. What changed:  medication strength how much to take   sucralfate 1 GM/10ML suspension Commonly known as: CARAFATE Take 10 mLs (1 g total) by mouth 2 (two) times daily.   sulfamethoxazole-trimethoprim 800-160 MG tablet Commonly known as: BACTRIM DS Take 1 tablet by mouth daily. Start taking on: March 30, 2022        Disposition and follow-up:   Kathy Little was discharged from [facility] in Good condition.  At the hospital follow up visit please address:  1.   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 Hilarie Fredrickson  SBPrecurrent ascites -IR day center referral for prn paracentesis -Augmentin x4 days, then bactrim double strength daily for SBP prophylaxis  Hepatic encephalopathy -titrate lactulose as needed  2.  Labs / imaging needed at time of follow-up: Cbc,CMP,INR  3.  Pending labs/ test needing follow-up: NA  Follow-up Appointments: 04/03/2022 9:15 AMHarper, Quillian Quince, [facility]4/02/2022 8:50 AMPyrtle, Lajuan Lines, [facility] Course by problem list: Kathy Little is a 62 y/o female with a pmh of cirrhosis 2/2 to untreated HCV, esophageal varices, and ascites who presented with bleed esophageal varices now s/p banding  and LVP.    Bleeding esophageal varices Presented with coffee ground emesis and bright blood per rectum. EGD 3/11  by Dr. Hilarie Fredrickson with grade 3 >15mm esophageal varices.On admission Hemoglobin to 10.3 from 13.6 5 days ago, baseline around 13-14. She was treated with IV protonix infusion, IV octreotide, IV ceftriaxone 1g x6 days.She was transfused 2u pRBCs during admission. She underwent EGD 3/14 with 5 bands placed. Initially noted  to having oozing esophageal varix, hemostasis achieved following banding. Hemoglobin stable throughout remainder of admission with no evidence of GI bleeding, hemoglobin 8.9 at discharge. Patient was not a candidate for elective TIPS upon evaluation 3/15 by IR given her high bilirubin at that time and hx of encephalopathy. [place] with patent portal vein and appropriate direction of flow. CT BRO showing esophageal varices, no gastric varices, enteropathy of protein loss, no spleno gastro renal shunt. Transitioned to protonix 40mg  BID at discharge.  Need surveillance EGD in 3-4 weeks.  SBPs/p LVP Initial paracentesis with 8L removed. Ascites fluid showed 1000 cell count with 78% neutrophil counts, with SBP. SBP albumin protocol of 100g day 1 and 75g day three. This is a first SBP episode. Blood cultures negative to date.  GI recommended to repeat paracentesis on 3/18 to look for response of antibiotic, total nucleated cell count 69 at that time with 4.4 total liters removed indicating antibiotic response. Completed ceftriaxone 1g IV x6 days, discharging with augmentin 875mg  PO x 4 days.Then on prophylaxis with Bactrim 800-160mg  daily.   Decompensated cirrhosis Untreated hep C Failed Hep C treatment with Interferon in the 1990's.  Hep C RNA quant 1,200,000.   MELD-NA around 25 during admission and she is child Pugh class C, very high risk for abdominal procedure. Will need to follow-up with ID office for her hep C treatment as well. Will follow up with GI for labs in 1 week and with Dr. Hilarie Fredrickson in 2 weeks. On discharge INR 1.9, AST/ALT 86/61, total bili 6.7, albumin 2.9, plt 74.   Hepatic encephalopathy Her mental status is stable today, alert and oriented x4. Endorses some out of character behavior. Having some memory difficulty.  No recorded BM over past 2 days, patient also endorsing no BM. -Lactulose increase to 20 g TID   Thrombocytopenia Platelet is 69 today, trended down from 110 since admission.   Smear with no evidence of hemolysis or abnormal morphology, most likely from portal hypertension and liver disease. -Trend CBC   Leukopenia Chronic leukopenia, no neutropenia. Will ctm. -Trend CBC  Lactic acidosis Lactate noted to be 4. Patient with previous leukopenia to 2.9 5 days ago now with WBC to 7.1. Will get blood cultures. Otherwise afebrile with no evidence of pulmonary infection, skin or soft tissue infection. Is being treated with ceftriaxone for UGIB with cirrhosis, will also empirically cover SBP. No EKG on admission, will get this now given elevated lactate and tachycardia. This is most likely secondary to acute blood loss anemia. -s/p 37.5mg  albumin -lactate q3 hours -f/u bcx -f/u EKG  Subjective: She states she is feeling better. She doe note that she needs more fluid off. She ate some french toast and some coffee. Denies any nausea or vomiting, She had a big bowel movement today and denies any blood in her stool. She is agreeable to see our clinic for primary care.   Discharge Exam:   BP (!) 112/57 (BP Location: Right Arm)   Pulse 89   Temp 98.3 F (36.8 C) (Oral)   Resp (!) 21  Ht 5\' 7"  (1.702 m)   Wt 64.9 kg   SpO2 95%   BMI 22.41 kg/m  Discharge exam:  [id number] ill appearing, no acute distress, laying in bed cooperatively HEENT:scleral icterus Pulm:normal wob, LCTAB [id number] rate, regular rhythm, no m/r/g, 2+ bilateral radial and DP pulses Abd/GI:distended, no ptp, no rebound or guarding Extremities/skin: warm, 1+ LE edema bilaterally just above the ankles,jaundiced Neuro: alert and oriented x4, no asterixis Pertinent Labs, Studies, and Procedures:     Latest Ref Rng & Units 03/25/2022    2:44 AM 03/24/2022    8:25 AM 03/23/2022    9:22 AM  CBC  WBC 4.0 - 10.5 K/uL 2.5  2.5  1.9   Hemoglobin 12.0 - 15.0 g/dL 8.9  8.9  8.4   Hematocrit 36.0 - 46.0 % 26.8  26.6  25.5   Platelets 150 - 400 K/uL 74  74  69        Latest Ref Rng & Units  03/25/2022    2:44 AM 03/24/2022    8:25 AM 03/23/2022    9:22 AM  CMP  Glucose 70 - 99 mg/dL 145  109  137   BUN 8 - 23 mg/dL 12  11  14    Creatinine 0.44 - 1.00 mg/dL 0.62  0.62  0.69   Sodium 135 - 145 mmol/L 133  129  131   Potassium 3.5 - 5.1 mmol/L 4.0  3.7  3.6   Chloride 98 - 111 mmol/L 106  103  107   CO2 22 - 32 mmol/L 21  21  18    Calcium 8.9 - 10.3 mg/dL 7.9  8.1  8.1   Total Protein 6.5 - 8.1 g/dL 4.9  5.4  5.1   Total Bilirubin 0.3 - 1.2 mg/dL 6.7  7.6  6.5   Alkaline Phos 38 - 126 U/L 77  76  65   AST 15 - 41 U/L 86  107  147   ALT 0 - 44 U/L 61  73  94    ECHOCARDIOGRAM COMPLETE  Result Date: 03/20/2022    ECHOCARDIOGRAM REPORT   Patient Name:   Kathy Little Date of Exam: 03/20/2022 Medical Rec #:  [id number]      Height:       67.0 in Accession #:    [id number]     Weight:       158.1 lb Date of Birth:  12-Oct-1960      BSA:          1.830 m Patient Age:    54 years       BP:           113/73 mmHg Patient Gender: F              HR:           110 bpm. Exam Location:  Inpatient Procedure: 2D Echo, Cardiac Doppler and Color Doppler Indications:    Abnormal ECG R94.31  History:        Patient has no prior history of Echocardiogram examinations.                 Risk Factors:Current Smoker.  Sonographer:    Ronny Flurry Referring Phys: [id number] [place]  1. Left ventricular ejection fraction, by estimation, is 45 to 50%. The left ventricle has mildly decreased function. The left ventricle demonstrates global hypokinesis. There is mild concentric left ventricular hypertrophy. Left ventricular diastolic parameters are indeterminate.  2. Right ventricular systolic function  is normal. The right ventricular size is normal.  3. The mitral valve is normal in structure. Trivial mitral valve regurgitation. No evidence of mitral stenosis.  4. The aortic valve is tricuspid. Aortic valve regurgitation is trivial. Aortic valve sclerosis/calcification is present, without any  evidence of aortic stenosis.  5. Aortic dilatation noted. There is mild dilatation of the ascending aorta, measuring 39 mm. Comparison(s): No prior Echocardiogram. FINDINGS  Left Ventricle: Left ventricular ejection fraction, by estimation, is 45 to 50%. The left ventricle has mildly decreased function. The left ventricle demonstrates global hypokinesis. The left ventricular internal cavity size was normal in size. There is  mild concentric left ventricular hypertrophy. Left ventricular diastolic parameters are indeterminate. Right Ventricle: The right ventricular size is normal. Right ventricular systolic function is normal. Left [facility]: Left atrial size was normal in size. Right [facility]: Right atrial size was normal in size. Pericardium: Trivial pericardial effusion is present. Mitral Valve: The mitral valve is normal in structure. Trivial mitral valve regurgitation. No evidence of mitral valve stenosis. Tricuspid Valve: The tricuspid valve is normal in structure. Tricuspid valve regurgitation is trivial. No evidence of tricuspid stenosis. Aortic Valve: The aortic valve is tricuspid. Aortic valve regurgitation is trivial. Aortic valve sclerosis/calcification is present, without any evidence of aortic stenosis. Aortic valve mean gradient measures 6.5 mmHg. Aortic valve peak gradient measures 10.9 mmHg. Aortic valve area, by VTI measures 2.07 cm. Pulmonic Valve: The pulmonic valve was normal in structure. Pulmonic valve regurgitation is not visualized. No evidence of pulmonic stenosis. Aorta: Aortic dilatation noted. There is mild dilatation of the ascending aorta, measuring 39 mm. Venous: The inferior vena cava was not well visualized. IAS/Shunts: No atrial level shunt detected by color flow Doppler.  LEFT VENTRICLE PLAX 2D LVIDd:         4.60 cm   Diastology LVIDs:         3.00 cm   LV e' medial:    6.99 cm/s LV PW:         1.10 cm   LV E/e' medial:  8.0 LV IVS:        1.20 cm   LV e' lateral:   11.80 cm/s LVOT  diam:     1.90 cm   LV E/e' lateral: 4.7 LV SV:         48 LV SV Index:   26 LVOT Area:     2.84 cm  RIGHT VENTRICLE RV S prime:     20.00 cm/s TAPSE (M-mode): 2.4 cm LEFT ATRIUM             Index        RIGHT ATRIUM           Index LA diam:        3.50 cm 1.91 cm/m   RA Area:     13.20 cm LA Vol (A2C):   56.2 ml 30.69 ml/m  RA Volume:   25.70 ml  14.05 ml/m LA Vol (A4C):   17.0 ml 9.29 ml/m LA Biplane Vol: 28.4 ml 15.52 ml/m  AORTIC VALVE AV Area (Vmax):    2.23 cm AV Area (Vmean):   2.04 cm AV Area (VTI):     2.07 cm AV Vmax:           165.00 cm/s AV Vmean:          116.500 cm/s AV VTI:            0.234 m AV Peak Grad:  10.9 mmHg AV Mean Grad:      6.5 mmHg LVOT Vmax:         130.00 cm/s LVOT Vmean:        83.633 cm/s LVOT VTI:          0.171 m LVOT/AV VTI ratio: 0.73  AORTA Ao Root diam: 3.50 cm Ao Asc diam:  3.90 cm MITRAL VALVE                TRICUSPID VALVE MV Area (PHT): 7.59 cm     TR Peak grad:   18.7 mmHg MV Decel Time: 100 msec     TR Vmax:        216.00 cm/s MV E velocity: 55.70 cm/s MV A velocity: 121.00 cm/s  SHUNTS MV E/A ratio:  0.46         Systemic VTI:  0.17 m                             Systemic Diam: 1.90 cm Kirk Ruths MD Electronically signed by Kirk Ruths MD Signature Date/Time: 03/20/2022/3:12:18 PM    Final    DG Chest Portable 1 View  Result Date: 03/19/2022 CLINICAL DATA:  Hematemesis.  Recent endoscopy. EXAM: PORTABLE CHEST 1 VIEW COMPARISON:  None Available. FINDINGS: The heart is normal in size. The mediastinal and hilar contours are normal. Minimal streaky basilar scarring changes versus subsegmental atelectasis. No pleural effusions or pulmonary infiltrates. The bony thorax is intact. IMPRESSION: 1. Minimal streaky basilar scarring changes versus subsegmental atelectasis. 2. No infiltrates or effusions. Electronically Signed   By: Marijo Sanes M.D.   On: 03/19/2022 15:52     Discharge Instructions: Discharge Instructions     Call MD for:  difficulty  breathing, headache or visual disturbances   Complete by: As directed    Call MD for:  persistant nausea and vomiting   Complete by: As directed    Call MD for:  redness, tenderness, or signs of infection (pain, swelling, redness, odor or green/yellow discharge around incision site)   Complete by: As directed    Call MD for:  severe uncontrolled pain   Complete by: As directed    Call MD for:  temperature >100.4   Complete by: As directed    Diet - low sodium heart healthy   Complete by: As directed    Increase activity slowly   Complete by: As directed      You were hospitalized for decompensated cirrhosis. You were found to have bleeding from dilated blood vessels in the esophagus called varices. You had an upper endoscopy and these were banded with no evidence of bleeding after. You developed fluid in the abdomen called ascites. We drained this and the labs showed an infection called spontaneous bacterial peritonitis (SBP). You were treated with IV antibiotics. We drained the fluid again and the infection was gone. You may need to get this drained in the outpatient setting. You will finish 4 days of an antibiotic called Augmentin for the infection. You will be on an antibiotic called bactrim for prevention.You developed some confusion called hepatic encephalopathy. You were started on lactulose and rifaximin for this. You were evaluated for TIPS, a procedure that puts a stent in the liver. You were not a candidate at this time. You will be evaluated outpatient for this. At home: Augmentin 875mg  BID by mouth for 4 days,then Bactrim 800-160mg  by mouth once daily after Lactulose 10mg  by mouth  three times daily Rifaximin 550mg  by mouth twice daily Spironolactone 50mg  and lasix 20mg  by mouth once daily F/u in the internal medicine center 04/03/22 at 9:15 AM. Call [phone] to reschedule  Signed: Iona Coach, MD 03/25/2022, 11:21 AM   Pager: Iona Coach, MD Internal Medicine Resident,  PGY-1 [facility]  Pager: [phone]

## 2022-03-25 NOTE — Telephone Encounter (Signed)
Patient Advocate Encounter  Prior Authorization for Xifaxan 550MG  tablets has been approved.    PA# [id number] Insurance [employer] Electronic Prior Authorization Form Effective dates: 03/25/2022 through 09/21/2022      Lyndel Safe, [place] Patient Advocate Specialist [place] Patient Advocate Team Direct Number: [phone]  Fax: [phone]

## 2022-03-25 NOTE — Progress Notes (Signed)
Progress Note  Primary GI: Dr. Hilarie Fredrickson   Subjective  Chief Complaint: Hematemesis   No family was present at the time of my evaluation. Patient sitting up on recliner states she is ready to go home. Bowel movements this morning, no melena or hematochezia. Minimal abdominal pain, feels better after paracentesis. No shortness of breath or chest pain.  No fevers or chills, nausea vomiting.    Objective   Vital signs in last 24 hours: Temp:  [97.8 F (36.6 C)-98.4 F (36.9 C)] 98.3 F (36.8 C) (03/19 0754) Pulse Rate:  [67-89] 89 (03/19 0754) Resp:  [15-21] 21 (03/19 0754) BP: (100-114)/(53-72) 112/57 (03/19 0754) SpO2:  [93 %-97 %] 95 % (03/19 0754) Weight:  [64.9 kg] 64.9 kg (03/19 0500) Last BM Date : 03/25/22 Last BM recorded by nurses in past 5 days Stool Type: Type 4 (Like a smooth, soft sausage or snake) (03/25/2022  9:13 AM)  General:  Alert, Well-developed, in NAD, jaundice Heart:  Regular rate and rhythm; no murmurs Pulm:  CTAB.  No W/R/R. Abdomen:  Soft, soft but still slightly distended. BS present.  Slight tenderness. Extremities:  Without edema. Neurologic:  Alert and oriented x 4;  grossly normal neurologically.  No asterixis Psych:  Alert and cooperative. Normal mood and affect.  Intake/Output from previous day: 03/18 0701 - 03/19 0700 In: 1200 [P.O.:1200] Out: -  Intake/Output this shift: No intake/output data recorded.  Studies/Results: IR Paracentesis  Result Date: 03/24/2022 INDICATION: Patient with a history of hepatitis-C, decompensated cirrhosis with esophageal varices and recurrent ascites. Request is for therapeutic and diagnostic paracentesis. EXAM: ULTRASOUND GUIDED THERAPEUTIC AND DIAGNOSTIC PARACENTESIS MEDICATIONS: Lidocaine 1% 10 mL COMPLICATIONS: None immediate. PROCEDURE: Informed written consent was obtained from the patient after a discussion of the risks, benefits and alternatives to treatment. A timeout was performed prior to the  initiation of the procedure. Initial ultrasound scanning demonstrates a moderate amount of ascites within the left lower abdominal quadrant. The left lower abdomen was prepped and draped in the usual sterile fashion. 1% lidocaine was used for local anesthesia. Following this, a 19 gauge, 7-cm, Yueh catheter was introduced. An ultrasound image was saved for documentation purposes. The paracentesis was performed. The catheter was removed and a dressing was applied. The patient tolerated the procedure well without immediate post procedural complication. FINDINGS: A total of approximately 4.4 L of bright yellow fluid was removed. Samples were sent to the laboratory as requested by the clinical team. IMPRESSION: Successful ultrasound-guided therapeutic and diagnostic paracentesis yielding 4.4 liters of peritoneal fluid. Read by: Rushie Nyhan, NP PLAN: The patient has previously been formally evaluated by the [facility] and is being actively followed for potential future intervention. Electronically Signed   By: Ruthann Cancer M.D.   On: 03/24/2022 14:38    Lab Results: Recent Labs    03/23/22 0922 03/24/22 0825 03/25/22 0244  WBC 1.9* 2.5* 2.5*  HGB 8.4* 8.9* 8.9*  HCT 25.5* 26.6* 26.8*  PLT 69* 74* 74*   BMET Recent Labs    03/23/22 0922 03/24/22 0825 03/25/22 0244  NA 131* 129* 133*  K 3.6 3.7 4.0  CL 107 103 106  CO2 18* 21* 21*  GLUCOSE 137* 109* 145*  BUN 14 11 12   CREATININE 0.69 0.62 0.62  CALCIUM 8.1* 8.1* 7.9*   LFT Recent Labs    03/25/22 0244  PROT 4.9*  ALBUMIN 2.9*  AST 86*  ALT 61*  ALKPHOS 77  BILITOT 6.7*  PT/INR Recent Labs    03/25/22 0244  LABPROT 21.8*  INR 1.9*     Scheduled Meds:  sodium chloride   Intravenous Once   lactulose  10 g Oral TID   midodrine  15 mg Oral TID WC   pantoprazole  40 mg Oral BID   rifaximin  550 mg Oral BID   sucralfate  1 g Oral BID   Continuous Infusions:   cefTRIAXone (ROCEPHIN)  IV 1 g (03/24/22 1617)      Patient profile:    62 y.o. female with a past medical history of recurrent cervical cancer s/p subtotal hysterectomy 2012 treated with brachytherapy 2016, chronic leukopenia, thrombocytopenia, chronic hepatitis C gentoype 1a who was recently diagnosed with decompensated cirrhosis, esophageal varices, presents hematemesis.    Impression/Plan:   Decompensated cirrhosis secondary to chronic hepatitis C genotype Ia Failed Hep C treatment with Interferon in the 1990's.  Hep C RNA quant 1,200,000.  AFP 102.9. MR liver showed cirrhosis, no hepatoma.  ANA positive. IgG 2,430. Normal SMA.   MELD-Na: 24  Due to high meld, not candidate for TIPS at this time Will monitor outpatient with repeat labs in 1 week and close follow up in 2 weeks  SBP and Ascites 03/15 paracentesis 8 L removed, cell count with SBP, on rocephin 03/18  paracentesis with removal of 4 L fluid.  Ascites fluid analysis PMN less than 250.  Transitioned from rocephin to Augmentin 875 mg twice daily to complete additional 4 days for 10-day course After completion of Augmentin start Bactrim 1 double strength tablet daily for SBP secondary prophylaxis Patient on midodrine Not TIPS candidate Start on lasix 20 mg and spironolactone 50 mg Will need repeat labs next week, will plan to arrange through GI office.   CBC, CMP, PT and INR Follow-up with Dr. Hilarie Fredrickson or APP in GI office on 04/02  Esophageal Varices with suspected bleed 03/20/2022 No gross lesions in the proximal esophagus. - Grade III esophageal varices oozing blood. Completely eradicated. Banded. - Z-line irregular, 35 cm from the incisors. - 2 cm hiatal hernia. - Clotted blood in the cardia, in the gastric fundus and in the gastric body. - Portal hypertensive gastropathy. - No gross lesions in the duodenal bulb, in the first portion of the duodenum and in the second portion of the duodenum.  -Per IR not candidate for  elective TIPS at this time, will continue to trend labs/MELD  -CT BRTO protocol 03/21/2022 confirms esophageal varices but no gastric varices.  Negative for significant spleen no gastrorenal shunt.  Portal hypertension small and moderate volume ascites and splenomegaly.  Diffuse bowel wall thickening likely enteropathy protein loss.   Hepatic Encephalopathy:  03/21/2022 Ammonia 52 Possible trigger: GI Bleed -There is no evidence of encephalopathy  Continue lactulose and rifaximin outpatient.  - advised to not drive.   with Coagulopathy 03/25/2022 INR 1.9   Principal Problem:   Esophageal varices with bleeding (HCC) Active Problems:   Decompensated hepatic cirrhosis (HCC)   Ascites   Portal hypertension (HCC)   Upper GI bleed   SBP (spontaneous bacterial peritonitis) ([place])   Cirrhosis of liver with ascites (HCC)   Encephalopathy, hepatic (HCC)   Liver cirrhosis secondary to NASH ([place])   Abnormal LFTs    LOS: 6 days   Vladimir Crofts  03/25/2022, 11:18 AM

## 2022-03-25 NOTE — Progress Notes (Signed)
Patient discharging home. Vital signs stable at time of discharge as reflected in discharge summary. Discharge instructions given and verbal understanding returned. No questions at this time. Patient to be discharged with TOC medications.

## 2022-03-26 ENCOUNTER — Other Ambulatory Visit: Payer: Self-pay

## 2022-03-26 ENCOUNTER — Other Ambulatory Visit (HOSPITAL_COMMUNITY): Payer: Self-pay

## 2022-03-26 ENCOUNTER — Telehealth: Payer: Self-pay | Admitting: Nurse Practitioner

## 2022-03-26 DIAGNOSIS — K7460 Unspecified cirrhosis of liver: Secondary | ICD-10-CM

## 2022-03-26 LAB — CULTURE, BODY FLUID W GRAM STAIN -BOTTLE: Culture: NO GROWTH

## 2022-03-26 NOTE — Telephone Encounter (Signed)
Kathy Little, pt was discharged home form the hospital yesterday. Pls send her to our lab in one week to have a CBC, CMP and PT/INR. She has a follow up appt scheduled with Dr. Hilarie Fredrickson 04/08/2022 and an appointment with Roosevelt Locks NP at the [facility] 04/14/2022.

## 2022-03-26 NOTE — Telephone Encounter (Signed)
Pt made aware of Carl Best NP recommendations.  Orders for labs placed in Epic. Pt made aware to come in 1 week and have labs drawn. Pt reminded of follow up appt scheduled with Dr. Hilarie Fredrickson 04/08/2022  Pt verbalized understanding with all questions answered.

## 2022-03-27 LAB — BODY FLUID CULTURE W GRAM STAIN
Culture: NO GROWTH
Gram Stain: NONE SEEN

## 2022-03-31 ENCOUNTER — Other Ambulatory Visit (INDEPENDENT_AMBULATORY_CARE_PROVIDER_SITE_OTHER): Payer: Medicaid Other

## 2022-03-31 DIAGNOSIS — K7460 Unspecified cirrhosis of liver: Secondary | ICD-10-CM | POA: Diagnosis not present

## 2022-03-31 DIAGNOSIS — R188 Other ascites: Secondary | ICD-10-CM

## 2022-03-31 LAB — COMPREHENSIVE METABOLIC PANEL
ALT: 46 U/L — ABNORMAL HIGH (ref 0–35)
AST: 72 U/L — ABNORMAL HIGH (ref 0–37)
Albumin: 3.2 g/dL — ABNORMAL LOW (ref 3.5–5.2)
Alkaline Phosphatase: 149 U/L — ABNORMAL HIGH (ref 39–117)
BUN: 10 mg/dL (ref 6–23)
CO2: 21 mEq/L (ref 19–32)
Calcium: 8.2 mg/dL — ABNORMAL LOW (ref 8.4–10.5)
Chloride: 100 mEq/L (ref 96–112)
Creatinine, Ser: 0.66 mg/dL (ref 0.40–1.20)
GFR: 94.93 mL/min (ref >60.00)
Glucose, Bld: 108 mg/dL — ABNORMAL HIGH (ref 70–99)
Potassium: 4 mEq/L (ref 3.5–5.1)
Sodium: 126 mEq/L — ABNORMAL LOW (ref 135–145)
Total Bilirubin: 6.7 mg/dL — ABNORMAL HIGH (ref 0.2–1.2)
Total Protein: 6.2 g/dL (ref 6.0–8.3)

## 2022-03-31 LAB — CBC WITH DIFFERENTIAL/PLATELET
Basophils Absolute: 0 10*3/uL (ref 0.0–0.1)
Basophils Relative: 0.8 % (ref 0.0–3.0)
Eosinophils Absolute: 0.1 10*3/uL (ref 0.0–0.7)
Eosinophils Relative: 1.4 % (ref 0.0–5.0)
HCT: 31.4 % — ABNORMAL LOW (ref 36.0–46.0)
Hemoglobin: 10.6 g/dL — ABNORMAL LOW (ref 12.0–15.0)
Lymphocytes Relative: 8.2 % — ABNORMAL LOW (ref 12.0–46.0)
Lymphs Abs: 0.4 10*3/uL — ABNORMAL LOW (ref 0.7–4.0)
MCHC: 33.8 g/dL (ref 30.0–36.0)
MCV: 100.2 fl — ABNORMAL HIGH (ref 78.0–100.0)
Monocytes Absolute: 0.5 10*3/uL (ref 0.1–1.0)
Monocytes Relative: 10.6 % (ref 3.0–12.0)
Neutro Abs: 3.4 10*3/uL (ref 1.4–7.7)
Neutrophils Relative %: 79 % — ABNORMAL HIGH (ref 43.0–77.0)
Platelets: 139 10*3/uL — ABNORMAL LOW (ref 150.0–400.0)
RBC: 3.13 Mil/uL — ABNORMAL LOW (ref 3.87–5.11)
RDW: 23.1 % — ABNORMAL HIGH (ref 11.5–15.5)
WBC: 4.3 10*3/uL (ref 4.0–10.5)

## 2022-03-31 LAB — PROTIME-INR
INR: 1.8 ratio — ABNORMAL HIGH (ref 0.8–1.0)
Prothrombin Time: 19 s — ABNORMAL HIGH (ref 9.6–13.1)

## 2022-04-01 NOTE — Progress Notes (Signed)
CC: Hospital Follow-up  HPI:   [name] is a 62 y.o. female with a past medical history of hepatitis C, cirrhosis, and vaginal cancer who presents for hospital follow-up. She was admitted to [facility] on 3-13 for decompensated cirrhosis, found to have esophageal variceal bleeding managed with banding, hospital course complicated by spontaneous bacterial peritonitis treated with antibiotics, and discharged 3-19 with new home medication regimen consisting of lactulose, rifaximin, spironolactone, and prophylactic trimethoprim-sulfamethoxazole.   Past Medical History:  Diagnosis Date   Atherosclerosis    Cirrhosis ([place])    Hepatitis C 1997   Hepatitis C    Hiatal hernia    History of brachytherapy 11/28/14, 12/05/14. 12/12/14   proximal vagina 24 gray   Radiation 10/10/2014 through 11/07/201610/04/2014 through 11/13/2014   Pelvis 45 gray    Vaginal cancer ([place])      Review of Systems:    Reports abdominal pain, leg swelling Denies fever, chills, sweats, nausea, constipation    Physical Exam:  Vitals:   04/03/22 0925  BP: 126/66  Pulse: 80  Temp: 97.7 F (36.5 C)  TempSrc: Oral  SpO2: 99%  Weight: 161 lb 12.8 oz (73.4 kg)  Height: 5\' 7"  (1.702 m)    General:   awake and alert, sitting comfortably in chair, cooperative, not in acute distress Skin:   warm and dry, jaundiced Lungs:   normal respiratory effort, breathing unlabored, symmetrical chest rise, no crackles or wheezing Cardiac:   regular rate and rhythm, normal S1 and S2 Abdomen:   soft and profusely distended, lower extremity 1-2+ pitting edema, no rigidity or guarding Neurologic:   oriented to person-place-time, backward spelling and basic addition intact, moving all extremities, asterixis bilaterally, no gross focal deficits Psychiatric:   euthymic mood with congruent affect, intelligible speech    Assessment & Plan:   Cirrhosis of liver with ascites ([place]) Patient has history of cirrhosis secondary to  chronic hepatitis C infection. Recently hospitalized for decompensation complicated by esophageal variceal bleeding and spontaneous bacterial peritonitis. Two paracenteses drained total of 12.4L fluid during hospitalization. Weight at discharge was 143lbs, currently 161lbs. Since leaving the hospital, her abdomen has swelled and she has been experiencing increasing discomfort. Reports abdominal pain, leg swelling, low appetite, and fatigue. Denies nausea, fever, chills, and sweats. Exam notable for abdominal distension, lower extremity swelling, asterixis, and jaundice. Fully oriented and mental status at baseline. Backward spelling and basic addition abilities intact. Given weight gain of 18lbs and profuse abdominal distension on exam, patient would benefit from urgent paracentesis. Plan to increase diuretic regimen, BMP performed in clinic only notable for Na 129 in setting of hypervolemia.   - Check CMP, CBC, and PT-INR - Increase spironolactone from 50mg  to 100mg  q24 - Increase furosemide from 20mg  to 40mg  q24 - Urgent paracentesis per IR team - Return to [facility] in about one week    SBP (spontaneous bacterial peritonitis) ([place]) Patient recently hospitalized for decompensated cirrhosis complicated by spontaneous bacterial peritonitis. Prescribed course of amoxicillin-clavulanate with plan to then transition into prophylactic trimethoprim-sulfamethoxazole daily. Since leaving the hospital, she finished the Augmentin course and has been taking Bactrim daily as prescribed. Denies fever, chills, and sweats. Exam revealed abdominal distension with lower extremity swelling, but vitals all within normal limits. Low concern for spontaneous bacterial peritonitis.  - Continue trimethoprim-sulfamethoxazole 800-160mg  q24    Encephalopathy, hepatic ([facility]) Patient recently hospitalized for hepatic encephalopathy secondary to decompensated cirrhosis, discharged with lactulose and rifaximin. Since leaving the  hospital, her cognition has been at baseline.  Denies confusion and memory difficulties. Fully oriented on exam, can spell "world" backwards and perform basic addition. Exam notable for jaundice and asterixis. Since leaving the hospital, she has been adherent to lactulose and rifaximin. Produces several bowel movements per day, consistency is loose.  - Continue lactulose 10g q8 and rifaximin 550mg  q12    Esophageal varices with bleeding [facility]) Patient recently hospitalized for decompensated cirrhosis complicated by esophageal varices requiring pantoprazole, octreotide, ceftriaxone, two pRBC units, and ultimately banding. Her hemoglobin remained stable after banding procedure, 8.9 at discharge. Prescribed pantoprazole at discharge and instructed to follow-up with gastroenterology for repeat esophagogastroduodenoscopy on 4-2. Denies lightheadedness, bleeding, vomiting, melena, hematochezia, and epigastric pain.  - Continue pantoprazole 40mg  q12 - Check CBC - Gastroenterology visit scheduled for 4-2    Hepatitis C virus infection Patient has history of chronic hepatitis C infection, most recent qRNA collected 01-2022 was 1,200,000. Patient has cirrhosis and an upcoming visit with [facility] team on 4-8. She would benefit from treatment if accepted for transplant.   - Consider ID referral at next [facility] visit      See Encounters Tab for problem based charting.  Patient discussed with Dr. Philipp Ovens

## 2022-04-03 ENCOUNTER — Other Ambulatory Visit: Payer: Self-pay

## 2022-04-03 ENCOUNTER — Ambulatory Visit (HOSPITAL_COMMUNITY)
Admission: RE | Admit: 2022-04-03 | Discharge: 2022-04-03 | Disposition: A | Discharge status: Home / Self Care | Payer: Medicaid Other | Source: Ambulatory Visit | Attending: Internal Medicine | Admitting: Internal Medicine

## 2022-04-03 ENCOUNTER — Other Ambulatory Visit (HOSPITAL_COMMUNITY): Payer: Self-pay | Admitting: Internal Medicine

## 2022-04-03 ENCOUNTER — Ambulatory Visit (INDEPENDENT_AMBULATORY_CARE_PROVIDER_SITE_OTHER): Payer: Medicaid Other | Admitting: Student

## 2022-04-03 ENCOUNTER — Encounter: Payer: Self-pay | Admitting: Student

## 2022-04-03 VITALS — BP 126/66 | HR 80 | Temp 97.7°F | Ht 67.0 in | Wt 161.8 lb

## 2022-04-03 DIAGNOSIS — K7460 Unspecified cirrhosis of liver: Secondary | ICD-10-CM | POA: Diagnosis present

## 2022-04-03 DIAGNOSIS — I8511 Secondary esophageal varices with bleeding: Secondary | ICD-10-CM | POA: Diagnosis not present

## 2022-04-03 DIAGNOSIS — R188 Other ascites: Secondary | ICD-10-CM

## 2022-04-03 DIAGNOSIS — K7682 Hepatic encephalopathy: Secondary | ICD-10-CM | POA: Diagnosis not present

## 2022-04-03 DIAGNOSIS — K652 Spontaneous bacterial peritonitis: Secondary | ICD-10-CM | POA: Diagnosis not present

## 2022-04-03 DIAGNOSIS — B182 Chronic viral hepatitis C: Secondary | ICD-10-CM

## 2022-04-03 DIAGNOSIS — B1920 Unspecified viral hepatitis C without hepatic coma: Secondary | ICD-10-CM | POA: Insufficient documentation

## 2022-04-03 HISTORY — PX: IR PARACENTESIS: IMG2679

## 2022-04-03 LAB — COMPREHENSIVE METABOLIC PANEL
ALT: 44 U/L (ref 0–44)
AST: 83 U/L — ABNORMAL HIGH (ref 15–41)
Albumin: 3.1 g/dL — ABNORMAL LOW (ref 3.5–5.0)
Alkaline Phosphatase: 168 U/L — ABNORMAL HIGH (ref 38–126)
Anion gap: 10 (ref 5–15)
BUN: 9 mg/dL (ref 8–23)
CO2: 18 mmol/L — ABNORMAL LOW (ref 22–32)
Calcium: 8.4 mg/dL — ABNORMAL LOW (ref 8.9–10.3)
Chloride: 101 mmol/L (ref 98–111)
Creatinine, Ser: 0.85 mg/dL (ref 0.44–1.00)
GFR, Estimated: >60 mL/min (ref >60)
Glucose, Bld: 90 mg/dL (ref 70–99)
Potassium: 4.1 mmol/L (ref 3.5–5.1)
Sodium: 129 mmol/L — ABNORMAL LOW (ref 135–145)
Total Bilirubin: 5.5 mg/dL — ABNORMAL HIGH (ref 0.3–1.2)
Total Protein: 6.9 g/dL (ref 6.5–8.1)

## 2022-04-03 LAB — CBC
HCT: 33.1 % — ABNORMAL LOW (ref 36.0–46.0)
Hemoglobin: 11 g/dL — ABNORMAL LOW (ref 12.0–15.0)
MCH: 35.5 pg — ABNORMAL HIGH (ref 26.0–34.0)
MCHC: 33.2 g/dL (ref 30.0–36.0)
MCV: 106.8 fL — ABNORMAL HIGH (ref 80.0–100.0)
Platelets: 111 10*3/uL — ABNORMAL LOW (ref 150–400)
RBC: 3.1 MIL/uL — ABNORMAL LOW (ref 3.87–5.11)
RDW: 21.9 % — ABNORMAL HIGH (ref 11.5–15.5)
WBC: 3.5 10*3/uL — ABNORMAL LOW (ref 4.0–10.5)
nRBC: 0 % (ref 0.0–0.2)

## 2022-04-03 LAB — PROTIME-INR
INR: 1.5 — ABNORMAL HIGH (ref 0.8–1.2)
Prothrombin Time: 18.3 seconds — ABNORMAL HIGH (ref 11.4–15.2)

## 2022-04-03 MED ORDER — LIDOCAINE HCL 1 % IJ SOLN
INTRAMUSCULAR | Status: AC
  Administered 2022-04-03: 10 mL
  Filled 2022-04-03: qty 20

## 2022-04-03 MED ORDER — FUROSEMIDE 20 MG PO TABS: 40.0000 mg | ORAL_TABLET | Freq: Every day | ORAL | 2 refills | Status: DC

## 2022-04-03 MED ORDER — SPIRONOLACTONE 50 MG PO TABS: 100.0000 mg | ORAL_TABLET | Freq: Every day | ORAL | 2 refills | Status: DC

## 2022-04-03 NOTE — Patient Instructions (Addendum)
  Thank you, Ms.Rudie Laffitte, for allowing [place] to provide your care today. Today we discussed . . .  > Cirrhosis       - we are checking some laboratory tests to help guide next steps and will call you with the results        - we have sent an urgent referral to interventional radiology for a paracentesis        - you may need to return for an admission into the hospital but can also wait for the paracentesis as an outpatient procedure      - continue to take the medications that you were given at discharge on 3-19   I have ordered the following labs for you:  Lab Orders         Protime-INR         CBC no Diff         CMP w [place] (STAT/Sunquest-performed on-site)       Tests ordered today:  none   Referrals ordered today:   Referral Orders         Ambulatory referral to Interventional Radiology       I have ordered the following medication/changed the following medications:   Stop the following medications: There are no discontinued medications.   Start the following medications: No orders of the defined types were placed in this encounter.     Follow up:  1 month     Remember:  Please continue to take your medications. We are ordering some lab tests and will call you with the results. At that point, we may also adjust your diuretic medication dose. Please return to clinic in one month or sooner if you need [place]!   Should you have any questions or concerns please call the internal medicine clinic at [phone].     Roswell Nickel, MD [place]

## 2022-04-03 NOTE — Assessment & Plan Note (Signed)
>>  ASSESSMENT AND PLAN FOR SBP (SPONTANEOUS BACTERIAL PERITONITIS) WRITTEN ON 04/03/2022  6:23 PM BY HARPER, DANIEL, MD  Patient recently hospitalized for decompensated cirrhosis complicated by spontaneous bacterial peritonitis. Prescribed course of amoxicillin-clavulanate with plan to then transition into prophylactic trimethoprim-sulfamethoxazole daily. Since leaving the hospital, she finished the Augmentin course and has been taking Bactrim daily as prescribed. Denies fever, chills, and sweats. Exam revealed abdominal distension with lower extremity swelling, but vitals all within normal limits. Low concern for spontaneous bacterial peritonitis.  - Continue trimethoprim-sulfamethoxazole 800-160mg  q24

## 2022-04-03 NOTE — Assessment & Plan Note (Addendum)
>>  ASSESSMENT AND PLAN FOR ENCEPHALOPATHY, HEPATIC WRITTEN ON 04/03/2022  6:18 PM BY Arion Shankles, MD  Patient recently hospitalized for hepatic encephalopathy secondary to decompensated cirrhosis, discharged with lactulose and rifaximin. Since leaving the hospital, her cognition has been at baseline. Denies confusion and memory difficulties. Fully oriented on exam, can spell "world" backwards and perform basic addition. Exam notable for jaundice and asterixis. Since leaving the hospital, she has been adherent to lactulose and rifaximin. Produces several bowel movements per day, consistency is loose.  - Continue lactulose 10g q8 and rifaximin  q12    >>ASSESSMENT AND PLAN FOR ELEVATED LIVER FUNCTION TESTS WRITTEN ON 04/23/2022  5:40 PM BY Adron Bene, MD  >>ASSESSMENT AND PLAN FOR ASCITES WRITTEN ON 04/23/2022  5:40 PM BY Adron Bene, MD  >>ASSESSMENT AND PLAN FOR SBP (SPONTANEOUS BACTERIAL PERITONITIS) WRITTEN ON 04/03/2022  6:23 PM BY Marvin Grabill, MD  Patient recently hospitalized for decompensated cirrhosis complicated by spontaneous bacterial peritonitis. Prescribed course of amoxicillin-clavulanate with plan to then transition into prophylactic trimethoprim-sulfamethoxazole daily. Since leaving the hospital, she finished the Augmentin course and has been taking Bactrim daily as prescribed. Denies fever, chills, and sweats. Exam revealed abdominal distension with lower extremity swelling, but vitals all within normal limits. Low concern for spontaneous bacterial peritonitis.  - Continue trimethoprim-sulfamethoxazole 800-160mg  q24

## 2022-04-03 NOTE — Assessment & Plan Note (Addendum)
Patient recently hospitalized for decompensated cirrhosis complicated by spontaneous bacterial peritonitis. Prescribed course of amoxicillin-clavulanate with plan to then transition into prophylactic trimethoprim-sulfamethoxazole daily. Since leaving the hospital, she finished the Augmentin course and has been taking Bactrim daily as prescribed. Denies fever, chills, and sweats. Exam revealed abdominal distension with lower extremity swelling, but vitals all within normal limits. Low concern for spontaneous bacterial peritonitis.  - Continue trimethoprim-sulfamethoxazole 800-160mg  q24

## 2022-04-03 NOTE — Procedures (Addendum)
PROCEDURE SUMMARY:  Successful US guided paracentesis from left lateral abdomen.  Yielded 11.3 liters of yellow fluid.  No immediate complications.  Pt tolerated well.   Specimen was not sent for labs.  EBL < 102mL  Docia Barrier PA-C 04/03/2022 12:26 PM

## 2022-04-03 NOTE — Assessment & Plan Note (Signed)
Patient has history of chronic hepatitis C infection, most recent qRNA collected 01-2022 was 1,200,000. Patient has cirrhosis and an upcoming visit with [facility] team on 4-8. She would benefit from treatment if accepted for transplant.   - Consider ID referral at next [facility] visit

## 2022-04-03 NOTE — Assessment & Plan Note (Addendum)
>>  ASSESSMENT AND PLAN FOR CIRRHOSIS OF LIVER WITH ASCITES WRITTEN ON 04/03/2022  6:22 PM BY Cataleya Cristina, MD  Patient has history of cirrhosis secondary to chronic hepatitis C infection. Recently hospitalized for decompensation complicated by esophageal variceal bleeding and spontaneous bacterial peritonitis. Two paracenteses drained total of 12.4L fluid during hospitalization. Weight at discharge was 143lbs, currently 161lbs. Since leaving the hospital, her abdomen has swelled and she has been experiencing increasing discomfort. Reports abdominal pain, leg swelling, low appetite, and fatigue. Denies nausea, fever, chills, and sweats. Exam notable for abdominal distension, lower extremity swelling, asterixis, and jaundice. Fully oriented and mental status at baseline. Backward spelling and basic addition abilities intact. Given weight gain of 18lbs and profuse abdominal distension on exam, patient would benefit from urgent paracentesis. Plan to increase diuretic regimen, BMP performed in clinic only notable for Na 129 in setting of hypervolemia.   - Check CMP, CBC, and PT-INR - Increase spironolactone from  to  q24 - Increase furosemide from  to  q24 - Urgent paracentesis per IR team - Return to [facility] in about one week    >>ASSESSMENT AND PLAN FOR LIVER CIRRHOSIS SECONDARY TO NASH WRITTEN ON 04/23/2022  5:40 PM BY Adron Bene, MD  >>ASSESSMENT AND PLAN FOR ENCEPHALOPATHY, HEPATIC WRITTEN ON 04/23/2022  5:40 PM BY Dessire Grimes, MD  >>ASSESSMENT AND PLAN FOR ENCEPHALOPATHY, HEPATIC WRITTEN ON 04/03/2022  6:18 PM BY Ashok Sawaya, MD  Patient recently hospitalized for hepatic encephalopathy secondary to decompensated cirrhosis, discharged with lactulose and rifaximin. Since leaving the hospital, her cognition has been at baseline. Denies confusion and memory difficulties. Fully oriented on exam, can spell "world" backwards and perform basic addition. Exam notable for jaundice and  asterixis. Since leaving the hospital, she has been adherent to lactulose and rifaximin. Produces several bowel movements per day, consistency is loose.  - Continue lactulose 10g q8 and rifaximin  q12    >>ASSESSMENT AND PLAN FOR ELEVATED LIVER FUNCTION TESTS WRITTEN ON 04/23/2022  5:40 PM BY Adron Bene, MD  >>ASSESSMENT AND PLAN FOR ASCITES WRITTEN ON 04/23/2022  5:40 PM BY Adron Bene, MD  >>ASSESSMENT AND PLAN FOR SBP (SPONTANEOUS BACTERIAL PERITONITIS) WRITTEN ON 04/03/2022  6:23 PM BY Zyanya Glaza, MD  Patient recently hospitalized for decompensated cirrhosis complicated by spontaneous bacterial peritonitis. Prescribed course of amoxicillin-clavulanate with plan to then transition into prophylactic trimethoprim-sulfamethoxazole daily. Since leaving the hospital, she finished the Augmentin course and has been taking Bactrim daily as prescribed. Denies fever, chills, and sweats. Exam revealed abdominal distension with lower extremity swelling, but vitals all within normal limits. Low concern for spontaneous bacterial peritonitis.  - Continue trimethoprim-sulfamethoxazole 800-160mg  q24

## 2022-04-03 NOTE — Assessment & Plan Note (Signed)
>>  ASSESSMENT AND PLAN FOR ENCEPHALOPATHY, HEPATIC WRITTEN ON 04/23/2022  5:40 PM BY HARPER, DANIEL, MD  >>ASSESSMENT AND PLAN FOR ENCEPHALOPATHY, HEPATIC WRITTEN ON 04/03/2022  6:18 PM BY HARPER, DANIEL, MD  Patient recently hospitalized for hepatic encephalopathy secondary to decompensated cirrhosis, discharged with lactulose and rifaximin. Since leaving the hospital, her cognition has been at baseline. Denies confusion and memory difficulties. Fully oriented on exam, can spell "world" backwards and perform basic addition. Exam notable for jaundice and asterixis. Since leaving the hospital, she has been adherent to lactulose and rifaximin. Produces several bowel movements per day, consistency is loose.  - Continue lactulose 10g q8 and rifaximin 550mg  q12    >>ASSESSMENT AND PLAN FOR ELEVATED LIVER FUNCTION TESTS WRITTEN ON 04/23/2022  5:40 PM BY Adron Bene, MD  >>ASSESSMENT AND PLAN FOR ASCITES WRITTEN ON 04/23/2022  5:40 PM BY Adron Bene, MD  >>ASSESSMENT AND PLAN FOR SBP (SPONTANEOUS BACTERIAL PERITONITIS) WRITTEN ON 04/03/2022  6:23 PM BY HARPER, DANIEL, MD  Patient recently hospitalized for decompensated cirrhosis complicated by spontaneous bacterial peritonitis. Prescribed course of amoxicillin-clavulanate with plan to then transition into prophylactic trimethoprim-sulfamethoxazole daily. Since leaving the hospital, she finished the Augmentin course and has been taking Bactrim daily as prescribed. Denies fever, chills, and sweats. Exam revealed abdominal distension with lower extremity swelling, but vitals all within normal limits. Low concern for spontaneous bacterial peritonitis.  - Continue trimethoprim-sulfamethoxazole 800-160mg  q24

## 2022-04-03 NOTE — Assessment & Plan Note (Signed)
>>  ASSESSMENT AND PLAN FOR ASCITES WRITTEN ON 04/23/2022  5:40 PM BY Adron Bene, MD  >>ASSESSMENT AND PLAN FOR SBP (SPONTANEOUS BACTERIAL PERITONITIS) WRITTEN ON 04/03/2022  6:23 PM BY HARPER, DANIEL, MD  Patient recently hospitalized for decompensated cirrhosis complicated by spontaneous bacterial peritonitis. Prescribed course of amoxicillin-clavulanate with plan to then transition into prophylactic trimethoprim-sulfamethoxazole daily. Since leaving the hospital, Kathy Little finished the Augmentin course and has been taking Bactrim daily as prescribed. Denies fever, chills, and sweats. Exam revealed abdominal distension with lower extremity swelling, but vitals all within normal limits. Low concern for spontaneous bacterial peritonitis.  - Continue trimethoprim-sulfamethoxazole 800-160mg  q24

## 2022-04-03 NOTE — Assessment & Plan Note (Signed)
Patient recently hospitalized for decompensated cirrhosis complicated by esophageal varices requiring pantoprazole, octreotide, ceftriaxone, two pRBC units, and ultimately banding. Her hemoglobin remained stable after banding procedure, 8.9 at discharge. Prescribed pantoprazole at discharge and instructed to follow-up with gastroenterology for repeat esophagogastroduodenoscopy on 4-2. Denies lightheadedness, bleeding, vomiting, melena, hematochezia, and epigastric pain.  - Continue pantoprazole 40mg  q12 - Check CBC - Gastroenterology visit scheduled for 4-2

## 2022-04-07 ENCOUNTER — Other Ambulatory Visit: Payer: Self-pay | Admitting: Student

## 2022-04-07 DIAGNOSIS — K7031 Alcoholic cirrhosis of liver with ascites: Secondary | ICD-10-CM

## 2022-04-07 LAB — MISC LABCORP TEST (SEND OUT)

## 2022-04-08 ENCOUNTER — Other Ambulatory Visit (INDEPENDENT_AMBULATORY_CARE_PROVIDER_SITE_OTHER): Payer: Medicaid Other

## 2022-04-08 ENCOUNTER — Ambulatory Visit (INDEPENDENT_AMBULATORY_CARE_PROVIDER_SITE_OTHER): Payer: Medicaid Other | Admitting: Internal Medicine

## 2022-04-08 ENCOUNTER — Encounter: Payer: Self-pay | Admitting: Internal Medicine

## 2022-04-08 ENCOUNTER — Other Ambulatory Visit (HOSPITAL_COMMUNITY): Payer: Self-pay

## 2022-04-08 ENCOUNTER — Other Ambulatory Visit: Payer: Self-pay

## 2022-04-08 VITALS — BP 120/80 | HR 92 | Ht 67.0 in | Wt 145.5 lb

## 2022-04-08 DIAGNOSIS — I8511 Secondary esophageal varices with bleeding: Secondary | ICD-10-CM

## 2022-04-08 DIAGNOSIS — R188 Other ascites: Secondary | ICD-10-CM

## 2022-04-08 DIAGNOSIS — K7290 Hepatic failure, unspecified without coma: Secondary | ICD-10-CM | POA: Diagnosis not present

## 2022-04-08 DIAGNOSIS — K7460 Unspecified cirrhosis of liver: Secondary | ICD-10-CM

## 2022-04-08 DIAGNOSIS — Z7901 Long term (current) use of anticoagulants: Secondary | ICD-10-CM | POA: Diagnosis not present

## 2022-04-08 DIAGNOSIS — K652 Spontaneous bacterial peritonitis: Secondary | ICD-10-CM | POA: Diagnosis not present

## 2022-04-08 LAB — COMPREHENSIVE METABOLIC PANEL
ALT: 43 U/L — ABNORMAL HIGH (ref 0–35)
AST: 96 U/L — ABNORMAL HIGH (ref 0–37)
Albumin: 3.1 g/dL — ABNORMAL LOW (ref 3.5–5.2)
Alkaline Phosphatase: 187 U/L — ABNORMAL HIGH (ref 39–117)
BUN: 12 mg/dL (ref 6–23)
CO2: 26 mEq/L (ref 19–32)
Calcium: 8.4 mg/dL (ref 8.4–10.5)
Chloride: 92 mEq/L — ABNORMAL LOW (ref 96–112)
Creatinine, Ser: 0.74 mg/dL (ref 0.40–1.20)
GFR: 87.54 mL/min (ref >60.00)
Glucose, Bld: 106 mg/dL — ABNORMAL HIGH (ref 70–99)
Potassium: 4.7 mEq/L (ref 3.5–5.1)
Sodium: 122 mEq/L — ABNORMAL LOW (ref 135–145)
Total Bilirubin: 5.5 mg/dL — ABNORMAL HIGH (ref 0.2–1.2)
Total Protein: 6.8 g/dL (ref 6.0–8.3)

## 2022-04-08 LAB — CBC
HCT: 35.8 % — ABNORMAL LOW (ref 36.0–46.0)
Hemoglobin: 12.2 g/dL (ref 12.0–15.0)
MCHC: 34 g/dL (ref 30.0–36.0)
MCV: 99.7 fl (ref 78.0–100.0)
Platelets: 138 10*3/uL — ABNORMAL LOW (ref 150.0–400.0)
RBC: 3.59 Mil/uL — ABNORMAL LOW (ref 3.87–5.11)
RDW: 20.2 % — ABNORMAL HIGH (ref 11.5–15.5)
WBC: 4.8 10*3/uL (ref 4.0–10.5)

## 2022-04-08 LAB — PROTIME-INR
INR: 1.6 ratio — ABNORMAL HIGH (ref 0.8–1.0)
Prothrombin Time: 16.3 s — ABNORMAL HIGH (ref 9.6–13.1)

## 2022-04-08 MED ORDER — ALBUMIN HUMAN 25 % IV SOLN: 12.5000 g | Freq: Once | INTRAVENOUS | Status: DC

## 2022-04-08 MED ORDER — RIFAXIMIN 550 MG PO TABS: 550.0000 mg | ORAL_TABLET | Freq: Two times a day (BID) | ORAL | 3 refills | Status: DC

## 2022-04-08 MED ORDER — RIFAXIMIN 550 MG PO TABS
550.0000 mg | ORAL_TABLET | Freq: Two times a day (BID) | ORAL | 3 refills | Status: DC
  Filled 2022-04-08: qty 60, 30d supply, fill #0

## 2022-04-08 MED ORDER — SULFAMETHOXAZOLE-TRIMETHOPRIM 800-160 MG PO TABS: 1.0000 | ORAL_TABLET | Freq: Every day | ORAL | 3 refills | Status: DC

## 2022-04-08 MED ORDER — SULFAMETHOXAZOLE-TRIMETHOPRIM 800-160 MG PO TABS
1.0000 | ORAL_TABLET | Freq: Every day | ORAL | 3 refills | Status: DC
  Filled 2022-04-08: qty 30, 30d supply, fill #0

## 2022-04-08 NOTE — Patient Instructions (Addendum)
_______________________________________________________  If your blood pressure at your visit was 140/90 or greater, please contact your primary care physician to follow up on this.  If you are age 61 or younger, your body mass index should be between 19-25. Your Body mass index is 22.79 kg/m. If this is out of the aformentioned range listed, please consider follow up with your Primary Care Provider.  ________________________________________________________  The [facility] providers would like to encourage you to use [facility] to communicate with providers for non-urgent requests or questions.  Due to long hold times on the telephone, sending your provider a message by [facility] may be a faster and more efficient way to get a response.  Please allow 48 business hours for a response.  Please remember that this is for non-urgent requests.  _______________________________________________________  Your provider has requested that you go to the basement level for lab work before leaving today. Press "B" on the elevator. The lab is located at the first door on the left as you exit the elevator.  We have sent the following medications to your pharmacy for you to pick up at your convenience: Bactrim, Xifaxan  CONTINUE Lasix, Aldactone, pantoprazole  Due to recent changes in healthcare laws, you may see the results of your imaging and laboratory studies on MyChart before your provider has had a chance to review them.  We understand that in some cases there may be results that are confusing or concerning to you. Not all laboratory results come back in the same time frame and the provider may be waiting for multiple results in order to interpret others.  Please give [place] 48 hours in order for your provider to thoroughly review all the results before contacting the office for clarification of your results.   You are scheduled for paracentesis at [facility] on 04-18-22 at [place] should arrive to  [facility] at 8:30am.  You are scheduled to follow up in our office on 05-19-22 at 3pm with [facility].  Thank you for entrusting me with your care and choosing [facility].  Dr Hilarie Fredrickson

## 2022-04-08 NOTE — Progress Notes (Signed)
Internal Medicine Clinic Attending  Case discussed with Dr. Harper  At the time of the visit.  We reviewed the resident's history and exam and pertinent patient test results.  I agree with the assessment, diagnosis, and plan of care documented in the resident's note.  

## 2022-04-08 NOTE — H&P (View-Only) (Signed)
 Subjective:    Patient ID: Kathy Little, female    DOB: 02/21/1960, 62 y.o.   MRN: [id number]  HPI Kathy Little is a 62-year-old female with a history of decompensated hepatitis C cirrhosis complicated by portal hypertension, recent variceal bleed status post EVL, diuretic refractory ascites with recent SBP, prior cervical cancer status post hysterectomy in 2012, chronic leukopenia who is here for follow-up.  She is here alone today and was hospitalized recently having been discharged on 03/25/2022.  Unfortunately she had a variceal hemorrhage and SBP just several days after her screening upper endoscopy.  At the time of her hospitalization she had EVL x 5 and was treated for SBP.  She is now on Bactrim once daily.  She is on Lasix 40 mg and Aldactone 100 mg daily.  She is taking potassium supplement.  She is on rifaximin 550 twice daily and midodrine.  She is also on twice daily PPI.  Lactulose was causing her to have cramping worse at night as well as vomiting so she stopped this about 2 days ago.  She has had no confusion.  She has been out of work and asked to be out at least through the 15th.  She will be seeing [facility] on the eighth.  She had an 11 L paracentesis on 04/03/2022.  She seemed no further melena or blood in stool.   Review of Systems As per HPI, otherwise negative  Current Medications, Allergies, Past Medical History, Past Surgical History, Family History and Social History were reviewed in [facility] electronic medical record.    Objective:   Physical Exam BP 120/80 (BP Location: Left Arm, Patient Position: Sitting, Cuff Size: Normal)   Pulse 92   Ht 5' 7" (1.702 m)   Wt 145 lb 8 oz (66 kg)   BMI 22.79 kg/m  Gen: awake, alert, NAD, chronically ill-appearing HEENT: anicteric, temporal wasting CV: RRR, no mrg Pulm: CTA b/l Abd: soft, mild to moderately distended with ascites, nontender, not tense,  +BS throughout Ext: no c/c, 1+ bilateral lower  extremity edema to mid shin Neuro: nonfocal, no asterixis     Latest Ref Rng & Units 04/03/2022   10:04 AM 03/31/2022   11:03 AM 03/25/2022    2:44 AM  CBC  WBC 4.0 - 10.5 K/uL 3.5  4.3  2.5   Hemoglobin 12.0 - 15.0 g/dL 11.0  10.6  8.9   Hematocrit 36.0 - 46.0 % 33.1  31.4  26.8   Platelets 150 - 400 K/uL 111  139.0  74    CMP     Component Value Date/Time   NA 129 (L) 04/03/2022 1004   NA 141 10/30/2014 1016   K 4.1 04/03/2022 1004   K 4.3 10/30/2014 1016   CL 101 04/03/2022 1004   CO2 18 (L) 04/03/2022 1004   CO2 29 10/30/2014 1016   GLUCOSE 90 04/03/2022 1004   GLUCOSE 81 10/30/2014 1016   BUN 9 04/03/2022 1004   BUN 14.4 10/30/2014 1016   CREATININE 0.85 04/03/2022 1004   CREATININE 0.8 10/30/2014 1016   CALCIUM 8.4 (L) 04/03/2022 1004   CALCIUM 9.6 10/30/2014 1016   PROT 6.9 04/03/2022 1004   PROT 7.4 10/30/2014 1016   ALBUMIN 3.1 (L) 04/03/2022 1004   ALBUMIN 3.7 10/30/2014 1016   AST 83 (H) 04/03/2022 1004   AST 91 (H) 10/30/2014 1016   ALT 44 04/03/2022 1004   ALT 103 (H) 10/30/2014 1016   ALKPHOS 168 (H) 04/03/2022 1004     ALKPHOS 129 10/30/2014 1016   BILITOT 5.5 (H) 04/03/2022 1004   BILITOT 0.82 10/30/2014 1016   GFRNONAA >60 04/03/2022 1004   Lab Results  Component Value Date   INR 1.5 (H) 04/03/2022   INR 1.8 (H) 03/31/2022   INR 1.9 (H) 03/25/2022   PROTIME 12.0 09/28/2014   MELD 3.0: 23 at 04/03/2022 10:04 AM MELD-Na: 23 at 04/03/2022 10:04 AM Calculated from: Serum Creatinine: 0.85 mg/dL (Using min of 1 mg/dL) at 04/03/2022 10:04 AM Serum Sodium: 129 mmol/L at 04/03/2022 10:04 AM Total Bilirubin: 5.5 mg/dL at 04/03/2022 10:04 AM Serum Albumin: 3.1 g/dL at 04/03/2022 10:04 AM INR(ratio): 1.5 at 04/03/2022 10:04 AM Age at listing (hypothetical): 62 years Sex: Female at 04/03/2022 10:04 AM  CTA ABDOMEN AND PELVIS WITHOUT AND WITH CONTRAST   TECHNIQUE: Multidetector CT imaging of the abdomen and pelvis was performed using the standard protocol  during bolus administration of intravenous contrast. Multiplanar reconstructed images and MIPs were obtained and reviewed to evaluate the vascular anatomy.   RADIATION DOSE REDUCTION: This exam was performed according to the departmental dose-optimization program which includes automated exposure control, adjustment of the mA and/or kV according to patient size and/or use of iterative reconstruction technique.   CONTRAST:  100mL OMNIPAQUE IOHEXOL 350 MG/ML SOLN   COMPARISON:  01/17/2022   FINDINGS: VASCULAR   Aorta: Mild atherosclerotic changes of the abdominal aorta. No aneurysm. No dissection. No ulcerated plaque or pedunculated plaque.   Celiac: Celiac artery patent without significant atherosclerosis.   SMA: SMA patent without significant atherosclerosis.   Renals:   - Right: Mild atherosclerosis at the origin of the right renal artery. Irregular beading in the mid and distal segment of the right renal artery. No aneurysm.   - Left: Mild atherosclerotic changes at the origin of the left renal artery. Irregular beading in the mid segment of the left renal artery.   IMA: Inferior mesenteric artery is patent.   Right lower extremity:   Unremarkable course, caliber, and contour of the right iliac system. Mild atherosclerotic changes. No aneurysm, dissection, or occlusion. Hypogastric artery is patent. Common femoral artery patent. Proximal SFA and profunda femoris patent.   Left lower extremity:   Unremarkable course, caliber, and contour of the left iliac system. Mild atherosclerotic changes. No aneurysm, dissection, or occlusion. Hypogastric artery is patent. Common femoral artery patent. Proximal SFA and profunda femoris patent.   Venous:   Portal venous:   Hepatic veins patent with typical arrangement of the right middle and left hepatic veins. There is an accessory right hepatic vein to segment 6.   Portal vein patent as well as the right and left portal  vein branches. No intraluminal thrombus. Splenic vein patent. SMV and IMV patent.   The left gastric vein/cardiac vein contributes to esophageal varices in the lower mediastinum.   CT negative for evidence of gastric varices.   There is no significant spleno gastro renal shunt.   Systemic venous:   Unremarkable hepatic IVC.  Unremarkable abdominal IVC.   Unremarkable bilateral renal veins.   Right iliac venous:   The right common iliac vein and external iliac vein are patent. No evidence of thrombus. The right internal iliac veins are patent.   No significant stenosis of the right common iliac vein.   The visualized proximal femoral veins appear patent.   Left iliac venous:   The left common iliac vein is somewhat compressed by the overlying right iliac arterial system in a typical configuration with no significant stenosis   identified or evidence of thrombus.   Left common iliac vein, external iliac vein, and the left internal iliac veins appear patent.   The visualized proximal left femoral veins appear patent.   Review of the MIP images confirms the above findings.   NON-VASCULAR   Lower chest: No acute finding of the lower chest. Atelectasis/scarring lower chest.   Hepatobiliary: Nodular contour of the liver, with enlarged caudate relative to the decreasing size of the remainder of the liver compatible with cirrhosis. Unremarkable gallbladder.   Pancreas: Unremarkable   Spleen: Diameter of the spleen on the axial images at least 11 cm.   Adrenals/Urinary Tract:   - Right adrenal gland: Unremarkable   - Left adrenal gland: Unremarkable.   - Right kidney: No hydronephrosis, nephrolithiasis, inflammation, or ureteral dilation. No focal lesion.   - Left Kidney: No hydronephrosis, nephrolithiasis, inflammation, or ureteral dilation. No focal lesion.   - Urinary Bladder: Unremarkable.   Stomach/Bowel:   - Stomach: Negative for hemorrhage. Global edema  of the gastric wall. No focality. Negative for gastric varices.   - Small bowel: Diffuse small bowel wall circumferential thickening/edema. No abnormal distension. No air-fluid levels. Enhancement of the mucosa maintained.   - Appendix: Appendix is not visualized, however, no inflammatory changes are present adjacent to the cecum to indicate an appendicitis.   - Colon: Mild edema and colon wall thickening. Enhancement of the mucosa maintained.   Lymphatic: No adenopathy.   Mesenteric: Small to moderate volume ascites.   Reproductive: Hysterectomy   Other: Body wall edema/anasarca   Musculoskeletal: No acute displaced fracture. No bony canal narrowing.   IMPRESSION: CT angiogram confirms the presence of esophageal varices, with no evidence of gastric varices.   Negative for significant spleno gastro renal shunt.   Additional stigmata of cirrhosis and portal hypertension with small-moderate volume ascites and splenomegaly.   Diffuse bowel wall edema/thickening involving the stomach, small bowel, and colon, with the mucosal enhancement maintained. Enteropathy of protein loss would be a leading differential, however, other inflammatory or infectious enteritis could also have this appearance.   Aortic Atherosclerosis (ICD10-I70.0).   Additional ancillary findings as above.   Signed,   Jaime S. Wagner, DO, ABVM, RPVI   [facility]   [facility]     Electronically Signed   By: Jaime  Wagner D.O.   On: 03/21/2022 15:51        Assessment & Plan:  62-year-old female with a history of decompensated hepatitis C cirrhosis complicated by portal hypertension, recent variceal bleed status post EVL, diuretic refractory ascites with recent SBP, prior cervical cancer status post hysterectomy in 2012, chronic leukopenia who is here for follow-up.    Decompensated hepatitis C cirrhosis/also positive ANA and elevated IgG --her  liver disease is predominantly hep C related and she failed treatment years ago when it was interferon based.  She also could potentially have overlapping AIH.  She will be seeing [facility] on 04/14/2022.  This is imperative for her and I value their opinion.  She also needs to be considered for liver transplantation.  See below regarding other directly liver related complications -- [facility] 04/14/22 -- HCV screening negative by CTA March 2024; repeat cross-sectional imaging in 1 year or ultrasound every 6 months beginning in 1 year  2.  Upper GI bleed/variceal hemorrhage --EVL x 5 on 03/20/2022.  I think she should be considered for TIPS and given.  They did consider this during her hospitalization but it   was felt that she was too sick at that time.  I am going to repeat liver labs today and I think she should see IR.  Will also get [facility] opinion on this regard next week -- Consideration of TIPS; IR outpatient follow-up -- Will discuss titration of beta-blocker at follow-up -- Will need repeat EGD banding protocol and 4-6 weeks; unless TIPS is performed first -- Continue pantoprazole 40 mg twice daily  3.  Ascites with SBP --diuretic refractory.  We have titrated diuretics and will continue to do so as tolerated by renal function and serum sodium.  We discussed again how TIPS would help this issue as well. -- Continue Bactrim 1 double strength tab daily indefinitely for prophylaxis -- Continue Lasix 40 mg and spironolactone 100 mg daily -- CBC, CMP and INR today -- Low-sodium diet -- Plan LVP up to 8 L with 50 g of IV albumin and check cell count in 10 days  4.  Hepatic encephalopathy --not an issue.  Lactulose not tolerated.  Continue rifaximin 550 twice daily.  Will definitely need rifaximin prophylactically if TIPS is performed -- Remain off lactulose for now -- Continue rifaximin 550 mg twice daily  Follow-up with us in 4 to 6 weeks  40 minutes total spent  today including patient facing time, coordination of care, reviewing medical history/procedures/pertinent radiology studies, and documentation of the encounter.   

## 2022-04-08 NOTE — Progress Notes (Signed)
Subjective:    Patient ID: Kathy Little, female    DOB: 28-Mar-1960, 62 y.o.   MRN: [id number]  HPI Ceciley Sailors is a 62 year old female with a history of decompensated hepatitis C cirrhosis complicated by portal hypertension, recent variceal bleed status post EVL, diuretic refractory ascites with recent SBP, prior cervical cancer status post hysterectomy in 2012, chronic leukopenia who is here for follow-up.  She is here alone today and was hospitalized recently having been discharged on 03/25/2022.  Unfortunately she had a variceal hemorrhage and SBP just several days after her screening upper endoscopy.  At the time of her hospitalization she had EVL x 5 and was treated for SBP.  She is now on Bactrim once daily.  She is on Lasix 40 mg and Aldactone 100 mg daily.  She is taking potassium supplement.  She is on rifaximin 550 twice daily and midodrine.  She is also on twice daily PPI.  Lactulose was causing her to have cramping worse at night as well as vomiting so she stopped this about 2 days ago.  She has had no confusion.  She has been out of work and asked to be out at least through the 15th.  She will be seeing [facility] on the eighth.  She had an 11 L paracentesis on 04/03/2022.  She seemed no further melena or blood in stool.   Review of Systems As per HPI, otherwise negative  Current Medications, Allergies, Past Medical History, Past Surgical History, Family History and Social History were reviewed in [employer] record.    Objective:   Physical Exam BP 120/80 (BP Location: Left Arm, Patient Position: Sitting, Cuff Size: Normal)   Pulse 92   Ht 5\' 7"  (1.702 m)   Wt 145 lb 8 oz (66 kg)   BMI 22.79 kg/m  Gen: awake, alert, NAD, chronically ill-appearing HEENT: anicteric, temporal wasting CV: RRR, no mrg Pulm: CTA b/l Abd: soft, mild to moderately distended with ascites, nontender, not tense,  +BS throughout Ext: no c/c, 1+ bilateral lower  extremity edema to mid shin Neuro: nonfocal, no asterixis     Latest Ref Rng & Units 04/03/2022   10:04 AM 03/31/2022   11:03 AM 03/25/2022    2:44 AM  CBC  WBC 4.0 - 10.5 K/uL 3.5  4.3  2.5   Hemoglobin 12.0 - 15.0 g/dL 11.0  10.6  8.9   Hematocrit 36.0 - 46.0 % 33.1  31.4  26.8   Platelets 150 - 400 K/uL 111  139.0  74    CMP     Component Value Date/Time   NA 129 (L) 04/03/2022 1004   NA 141 10/30/2014 1016   K 4.1 04/03/2022 1004   K 4.3 10/30/2014 1016   CL 101 04/03/2022 1004   CO2 18 (L) 04/03/2022 1004   CO2 29 10/30/2014 1016   GLUCOSE 90 04/03/2022 1004   GLUCOSE 81 10/30/2014 1016   BUN 9 04/03/2022 1004   BUN 14.4 10/30/2014 1016   CREATININE 0.85 04/03/2022 1004   CREATININE 0.8 10/30/2014 1016   CALCIUM 8.4 (L) 04/03/2022 1004   CALCIUM 9.6 10/30/2014 1016   PROT 6.9 04/03/2022 1004   PROT 7.4 10/30/2014 1016   ALBUMIN 3.1 (L) 04/03/2022 1004   ALBUMIN 3.7 10/30/2014 1016   AST 83 (H) 04/03/2022 1004   AST 91 (H) 10/30/2014 1016   ALT 44 04/03/2022 1004   ALT 103 (H) 10/30/2014 1016   ALKPHOS 168 (H) 04/03/2022 1004  ALKPHOS 129 10/30/2014 1016   BILITOT 5.5 (H) 04/03/2022 1004   BILITOT 0.82 10/30/2014 1016   GFRNONAA >60 04/03/2022 1004   Lab Results  Component Value Date   INR 1.5 (H) 04/03/2022   INR 1.8 (H) 03/31/2022   INR 1.9 (H) 03/25/2022   PROTIME 12.0 09/28/2014   MELD 3.0: 23 at 04/03/2022 10:04 AM MELD-Na: 23 at 04/03/2022 10:04 AM Calculated from: Serum Creatinine: 0.85 mg/dL (Using min of 1 mg/dL) at 04/03/2022 10:04 AM Serum Sodium: 129 mmol/L at 04/03/2022 10:04 AM Total Bilirubin: 5.5 mg/dL at 04/03/2022 10:04 AM Serum Albumin: 3.1 g/dL at 04/03/2022 10:04 AM INR(ratio): 1.5 at 04/03/2022 10:04 AM Age at listing (hypothetical): 77 years Sex: Female at 04/03/2022 10:04 AM  CTA ABDOMEN AND PELVIS WITHOUT AND WITH CONTRAST   TECHNIQUE: Multidetector CT imaging of the abdomen and pelvis was performed using the standard protocol  during bolus administration of intravenous contrast. Multiplanar reconstructed images and MIPs were obtained and reviewed to evaluate the vascular anatomy.   RADIATION DOSE REDUCTION: This exam was performed according to the departmental dose-optimization program which includes automated exposure control, adjustment of the mA and/or kV according to patient size and/or use of iterative reconstruction technique.   CONTRAST:  175mL OMNIPAQUE IOHEXOL 350 MG/ML SOLN   COMPARISON:  01/17/2022   FINDINGS: VASCULAR   Aorta: Mild atherosclerotic changes of the abdominal aorta. No aneurysm. No dissection. No ulcerated plaque or pedunculated plaque.   Celiac: Celiac artery patent without significant atherosclerosis.   SMA: SMA patent without significant atherosclerosis.   Renals:   - Right: Mild atherosclerosis at the origin of the right renal artery. Irregular beading in the mid and distal segment of the right renal artery. No aneurysm.   - Left: Mild atherosclerotic changes at the origin of the left renal artery. Irregular beading in the mid segment of the left renal artery.   IMA: Inferior mesenteric artery is patent.   Right lower extremity:   Unremarkable course, caliber, and contour of the right iliac system. Mild atherosclerotic changes. No aneurysm, dissection, or occlusion. Hypogastric artery is patent. Common femoral artery patent. Proximal SFA and profunda femoris patent.   Left lower extremity:   Unremarkable course, caliber, and contour of the left iliac system. Mild atherosclerotic changes. No aneurysm, dissection, or occlusion. Hypogastric artery is patent. Common femoral artery patent. Proximal SFA and profunda femoris patent.   Venous:   Portal venous:   Hepatic veins patent with typical arrangement of the right middle and left hepatic veins. There is an accessory right hepatic vein to segment 6.   Portal vein patent as well as the right and left portal  vein branches. No intraluminal thrombus. Splenic vein patent. SMV and IMV patent.   The left gastric vein/cardiac vein contributes to esophageal varices in the lower mediastinum.   CT negative for evidence of gastric varices.   There is no significant spleno gastro renal shunt.   Systemic venous:   Unremarkable hepatic IVC.  Unremarkable abdominal IVC.   Unremarkable bilateral renal veins.   Right iliac venous:   The right common iliac vein and external iliac vein are patent. No evidence of thrombus. The right internal iliac veins are patent.   No significant stenosis of the right common iliac vein.   The visualized proximal femoral veins appear patent.   Left iliac venous:   The left common iliac vein is somewhat compressed by the overlying right iliac arterial system in a typical configuration with no significant stenosis  identified or evidence of thrombus.   Left common iliac vein, external iliac vein, and the left internal iliac veins appear patent.   The visualized proximal left femoral veins appear patent.   Review of the MIP images confirms the above findings.   NON-VASCULAR   Lower chest: No acute finding of the lower chest. Atelectasis/scarring lower chest.   Hepatobiliary: Nodular contour of the liver, with enlarged caudate relative to the decreasing size of the remainder of the liver compatible with cirrhosis. Unremarkable gallbladder.   Pancreas: Unremarkable   Spleen: Diameter of the spleen on the axial images at least 11 cm.   Adrenals/Urinary Tract:   - Right adrenal gland: Unremarkable   - Left adrenal gland: Unremarkable.   - Right kidney: No hydronephrosis, nephrolithiasis, inflammation, or ureteral dilation. No focal lesion.   - Left Kidney: No hydronephrosis, nephrolithiasis, inflammation, or ureteral dilation. No focal lesion.   - Urinary Bladder: Unremarkable.   Stomach/Bowel:   - Stomach: Negative for hemorrhage. Global edema  of the gastric wall. No focality. Negative for gastric varices.   - Small bowel: Diffuse small bowel wall circumferential thickening/edema. No abnormal distension. No air-fluid levels. Enhancement of the mucosa maintained.   - Appendix: Appendix is not visualized, however, no inflammatory changes are present adjacent to the cecum to indicate an appendicitis.   - Colon: Mild edema and colon wall thickening. Enhancement of the mucosa maintained.   Lymphatic: No adenopathy.   Mesenteric: Small to moderate volume ascites.   Reproductive: Hysterectomy   Other: Body wall edema/anasarca   Musculoskeletal: No acute displaced fracture. No bony canal narrowing.   IMPRESSION: CT angiogram confirms the presence of esophageal varices, with no evidence of gastric varices.   Negative for significant spleno gastro renal shunt.   Additional stigmata of cirrhosis and portal hypertension with small-moderate volume ascites and splenomegaly.   Diffuse bowel wall edema/thickening involving the stomach, small bowel, and colon, with the mucosal enhancement maintained. Enteropathy of protein loss would be a leading differential, however, other inflammatory or infectious enteritis could also have this appearance.   Aortic Atherosclerosis (ICD10-I70.0).   Additional ancillary findings as above.   Signed,   Dulcy Fanny. Nadene Rubins, RPVI   [facility]   [facility]     Electronically Signed   By: Corrie Mckusick D.O.   On: 03/21/2022 15:51        Assessment & Plan:  62 year old female with a history of decompensated hepatitis C cirrhosis complicated by portal hypertension, recent variceal bleed status post EVL, diuretic refractory ascites with recent SBP, prior cervical cancer status post hysterectomy in 2012, chronic leukopenia who is here for follow-up.    Decompensated hepatitis C cirrhosis/also positive ANA and elevated IgG --her  liver disease is predominantly hep C related and she failed treatment years ago when it was interferon based.  She also could potentially have overlapping AIH.  She will be seeing [facility] on 04/14/2022.  This is imperative for her and I value their opinion.  She also needs to be considered for liver transplantation.  See below regarding other directly liver related complications -- [facility] 04/14/22 -- HCV screening negative by CTA March 2024; repeat cross-sectional imaging in 1 year or ultrasound every 6 months beginning in 1 year  2.  Upper GI bleed/variceal hemorrhage --EVL x 5 on 03/20/2022.  I think she should be considered for TIPS and given.  They did consider this during her hospitalization but it  was felt that she was too sick at that time.  I am going to repeat liver labs today and I think she should see IR.  Will also get [facility] opinion on this regard next week -- Consideration of TIPS; IR outpatient follow-up -- Will discuss titration of beta-blocker at follow-up -- Will need repeat EGD banding protocol and 4-6 weeks; unless TIPS is performed first -- Continue pantoprazole 40 mg twice daily  3.  Ascites with SBP --diuretic refractory.  We have titrated diuretics and will continue to do so as tolerated by renal function and serum sodium.  We discussed again how TIPS would help this issue as well. -- Continue Bactrim 1 double strength tab daily indefinitely for prophylaxis -- Continue Lasix 40 mg and spironolactone 100 mg daily -- CBC, CMP and INR today -- Low-sodium diet -- Plan LVP up to 8 L with 50 g of IV albumin and check cell count in 10 days  4.  Hepatic encephalopathy --not an issue.  Lactulose not tolerated.  Continue rifaximin 550 twice daily.  Will definitely need rifaximin prophylactically if TIPS is performed -- Remain off lactulose for now -- Continue rifaximin 550 mg twice daily  Follow-up with [place] in 4 to 6 weeks  40 minutes total spent  today including patient facing time, coordination of care, reviewing medical history/procedures/pertinent radiology studies, and documentation of the encounter.

## 2022-04-11 ENCOUNTER — Other Ambulatory Visit (INDEPENDENT_AMBULATORY_CARE_PROVIDER_SITE_OTHER): Payer: Medicaid Other

## 2022-04-11 DIAGNOSIS — K7460 Unspecified cirrhosis of liver: Secondary | ICD-10-CM

## 2022-04-11 DIAGNOSIS — K7290 Hepatic failure, unspecified without coma: Secondary | ICD-10-CM

## 2022-04-11 LAB — BASIC METABOLIC PANEL
BUN: 15 mg/dL (ref 6–23)
CO2: 23 mEq/L (ref 19–32)
Calcium: 8.2 mg/dL — ABNORMAL LOW (ref 8.4–10.5)
Chloride: 96 mEq/L (ref 96–112)
Creatinine, Ser: 0.81 mg/dL (ref 0.40–1.20)
GFR: 78.54 mL/min (ref >60.00)
Glucose, Bld: 111 mg/dL — ABNORMAL HIGH (ref 70–99)
Potassium: 4.1 mEq/L (ref 3.5–5.1)
Sodium: 122 mEq/L — ABNORMAL LOW (ref 135–145)

## 2022-04-14 ENCOUNTER — Other Ambulatory Visit (HOSPITAL_COMMUNITY): Payer: Self-pay | Admitting: Nurse Practitioner

## 2022-04-14 DIAGNOSIS — C52 Malignant neoplasm of vagina: Secondary | ICD-10-CM

## 2022-04-18 ENCOUNTER — Ambulatory Visit (HOSPITAL_COMMUNITY)
Admission: RE | Admit: 2022-04-18 | Discharge: 2022-04-18 | Disposition: A | Discharge status: Home / Self Care | Payer: No Typology Code available for payment source | Source: Ambulatory Visit | Attending: Internal Medicine | Admitting: Internal Medicine

## 2022-04-18 DIAGNOSIS — K7460 Unspecified cirrhosis of liver: Secondary | ICD-10-CM | POA: Insufficient documentation

## 2022-04-18 DIAGNOSIS — R188 Other ascites: Secondary | ICD-10-CM | POA: Insufficient documentation

## 2022-04-18 DIAGNOSIS — K7290 Hepatic failure, unspecified without coma: Secondary | ICD-10-CM | POA: Diagnosis present

## 2022-04-18 HISTORY — PX: IR PARACENTESIS: IMG2679

## 2022-04-18 LAB — BODY FLUID CELL COUNT WITH DIFFERENTIAL
Eos, Fluid: 0 %
Lymphs, Fluid: 56 %
Monocyte-Macrophage-Serous Fluid: 38 % — ABNORMAL LOW (ref 50–90)
Neutrophil Count, Fluid: 6 % (ref 0–25)
Total Nucleated Cell Count, Fluid: 12 cu mm (ref 0–1000)

## 2022-04-18 MED ORDER — LIDOCAINE HCL (PF) 1 % IJ SOLN
20.0000 mL | Freq: Once | INTRAMUSCULAR | Status: AC
  Administered 2022-04-18: 10 mL

## 2022-04-18 MED ORDER — ALBUMIN HUMAN 25 % IV SOLN
INTRAVENOUS | Status: AC
  Filled 2022-04-18: qty 200

## 2022-04-18 MED ORDER — ALBUMIN HUMAN 25 % IV SOLN
INTRAVENOUS | Status: AC
  Filled 2022-04-18: qty 100

## 2022-04-18 MED ORDER — ALBUMIN HUMAN 25 % IV SOLN
75.0000 g | Freq: Once | INTRAVENOUS | Status: AC
  Administered 2022-04-18: 75 g via INTRAVENOUS
  Filled 2022-04-18: qty 300

## 2022-04-18 MED ORDER — LIDOCAINE HCL 1 % IJ SOLN
INTRAMUSCULAR | Status: AC
  Filled 2022-04-18: qty 20

## 2022-04-18 NOTE — Procedures (Signed)
PROCEDURE SUMMARY:  Successful ultrasound guided paracentesis from the right lower quadrant.  Yielded 8 L  of straw colored fluid.  No immediate complications.  The patient tolerated the procedure well.   Specimen was sent for labs.  EBL < 46mL  The patient has required >/=2 paracenteses in a 30 day period and a screening evaluation by the [facility] has been arranged.

## 2022-04-21 LAB — PATHOLOGIST SMEAR REVIEW: Path Review: NEGATIVE

## 2022-04-22 ENCOUNTER — Other Ambulatory Visit (HOSPITAL_COMMUNITY): Payer: Self-pay

## 2022-04-22 ENCOUNTER — Ambulatory Visit (INDEPENDENT_AMBULATORY_CARE_PROVIDER_SITE_OTHER): Payer: Medicaid Other | Admitting: Internal Medicine

## 2022-04-22 ENCOUNTER — Encounter: Payer: Self-pay | Admitting: Internal Medicine

## 2022-04-22 ENCOUNTER — Other Ambulatory Visit: Payer: Self-pay

## 2022-04-22 ENCOUNTER — Other Ambulatory Visit (HOSPITAL_COMMUNITY)
Admission: RE | Admit: 2022-04-22 | Discharge: 2022-04-22 | Disposition: A | Discharge status: Home / Self Care | Payer: Medicaid Other | Source: Ambulatory Visit | Attending: Internal Medicine | Admitting: Internal Medicine

## 2022-04-22 VITALS — BP 141/65 | HR 100 | Temp 97.6°F | Ht 67.0 in | Wt 156.5 lb

## 2022-04-22 DIAGNOSIS — B182 Chronic viral hepatitis C: Secondary | ICD-10-CM | POA: Diagnosis not present

## 2022-04-22 DIAGNOSIS — R188 Other ascites: Secondary | ICD-10-CM | POA: Diagnosis not present

## 2022-04-22 DIAGNOSIS — I8500 Esophageal varices without bleeding: Secondary | ICD-10-CM

## 2022-04-22 DIAGNOSIS — K7460 Unspecified cirrhosis of liver: Secondary | ICD-10-CM

## 2022-04-22 DIAGNOSIS — R600 Localized edema: Secondary | ICD-10-CM

## 2022-04-22 DIAGNOSIS — K7290 Hepatic failure, unspecified without coma: Secondary | ICD-10-CM

## 2022-04-22 DIAGNOSIS — C52 Malignant neoplasm of vagina: Secondary | ICD-10-CM | POA: Diagnosis present

## 2022-04-22 DIAGNOSIS — E876 Hypokalemia: Secondary | ICD-10-CM

## 2022-04-22 MED ORDER — SUCRALFATE 1 GM/10ML PO SUSP
1.0000 g | Freq: Two times a day (BID) | ORAL | 0 refills | Status: DC
  Filled 2022-04-22: qty 600, 30d supply, fill #0

## 2022-04-22 MED ORDER — PANTOPRAZOLE SODIUM 40 MG PO TBEC
40.0000 mg | DELAYED_RELEASE_TABLET | Freq: Two times a day (BID) | ORAL | 0 refills | Status: DC
  Filled 2022-04-22: qty 60, 30d supply, fill #0

## 2022-04-22 MED ORDER — SULFAMETHOXAZOLE-TRIMETHOPRIM 800-160 MG PO TABS
1.0000 | ORAL_TABLET | Freq: Every day | ORAL | 3 refills | Status: DC
  Filled 2022-04-22: qty 30, 30d supply, fill #0
  Filled 2022-05-19: qty 30, 30d supply, fill #1
  Filled 2022-06-16: qty 30, 30d supply, fill #2

## 2022-04-22 MED ORDER — MIDODRINE HCL 5 MG PO TABS
15.0000 mg | ORAL_TABLET | Freq: Three times a day (TID) | ORAL | 0 refills | Status: AC
  Filled 2022-04-22: qty 270, 30d supply, fill #0

## 2022-04-22 MED ORDER — POTASSIUM CHLORIDE CRYS ER 20 MEQ PO TBCR
20.0000 meq | EXTENDED_RELEASE_TABLET | Freq: Every day | ORAL | 0 refills | Status: DC
Start: 2022-04-22 — End: 2022-05-27
  Filled 2022-04-22: qty 30, 30d supply, fill #0

## 2022-04-22 MED ORDER — RIFAXIMIN 550 MG PO TABS
550.0000 mg | ORAL_TABLET | Freq: Two times a day (BID) | ORAL | 3 refills | Status: DC
  Filled 2022-04-22 – 2022-04-25 (×3): qty 60, 30d supply, fill #0
  Filled 2022-04-28: qty 56, 28d supply, fill #0
  Filled 2022-05-19: qty 56, 28d supply, fill #1

## 2022-04-22 NOTE — Patient Instructions (Signed)
Dear Kathy Little,  Thank you for trusting [place] with your care. We discussed your liver disease and history of vaginal cancer. We would like for you to see a gynecologist. We will coordinate the referral with you. Please return in 1 month to follow up.

## 2022-04-22 NOTE — Progress Notes (Signed)
CC: 1 month follow up/pap smear/vaginal cancer  HPI:[name] is a 62 y.o. female who presents for evaluation of vaginal cancer. Please see individual problem based A/P for details.  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 Established with Gastroenterologist Dr. Rhea Little and was referred to [facility] for consideration of liver transplant. She re accumulates ascites quickly and is dependent on paracentesis. Hepatologist stopped her Lasix and Spironolactone since they provided no benefit and were worsening her hyponatremia. Beta blockers were also recommended against given her hypotension. She is scheduled for EGD with Dr. Rhea Little tomorrow to evaluate varices. Treatment of her hepatitis was deferred for after transplant. It was recommended that she be evaluated again given her history of vaginal cancer. This cancer was treated with radiation and hysterectomy. The patient could not tolerate chemo. Patient needs to be cancer free prior to liver transplant. A PET scan was ordered to evaluate for malignancy. Today, she presents for pelvic exam, monitoring of her hyponatremia, and volume control. Exam is concerning for recurrence of her vaginal cancer. [facility]. I have contacted their NP Kathy [facility] who is familiar with the patient. She will work on getting patient back into clinic for evaluation.  Patient does note that she [place] at the end of the month.    Depression, PHQ-9: Based on the patients  Flowsheet Row Office Visit from [facility]/22/2022 [facility]  PHQ-9 Total Score 0      score we have .  Past Medical History:  Diagnosis Date   Atherosclerosis    Cirrhosis    Hepatitis C 1997   Hepatitis C    Hiatal hernia     History of brachytherapy 11/28/14, 12/05/14. 12/12/14   proximal vagina 24 gray   Radiation 10/10/2014 through 11/07/201610/04/2014 through 11/13/2014   Pelvis 45 gray    Vaginal cancer    Review of Systems:   See HPI  Physical Exam: Vitals:   04/22/22 0856  BP: (!) 141/65  Pulse: 100  Temp: 97.6 F (36.4 C)  TempSrc: Oral  SpO2: 100%  Weight: 156 lb 8 oz (71 kg)  Height: 5\' 7"  (1.702 m)   General: uncomfortable appearing HEENT: Conjunctiva nl , antiicteric sclerae, moist mucous membranes, no exudate or erythema Cardiovascular: Normal rate, regular rhythm.  No murmurs, rubs, or gallops Pulmonary : Equal breath sounds, No wheezes, rales, or rhonchi Abdominal: distended/bloated, firm. No rebound tenderness. Ext: 2+ edema in BLE to mid-shin without evidence of skin breakdown.  Assessment & Plan:   See Encounters Tab for problem based charting.  Cirrhosis of liver with ascites Recent variceal bleed with banding. Repeat EGD 4/17 with GI. Little benefit to propranolol per hepatology. 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 She did not tolerate lactulose.  Volume is managed with paracentesis weekly. Diuretic refractory with hyponatremia. She is quite volume overloaded today and uncomfortable so I ordered for a paracentesis to be done.  Not on abx for SBP prophylaxis.  Treatment of hepatitis C would require either 24 weeks of Epclusa or 12 weeks Epclusa and Ribavirin. This is being deferred given decompensation and poor synthetic function. High meld score so would possibly receive transplant offer prior to completion of treatment.  Her history of vaginal cancer is more pressing and must be addressed prior  to transplant.   Hepatitis C virus infection Treatment being deferred until after transplant.  Recurrent vaginal cancer Pap smear performed. Physical exam is concerning for recurrence of cancer. Several nodular lesions present on right vaginal wall and one that appears to  circumscribe her urethra. Friable mucosa that bleeds easily. She is s/p hysterectomy so no cervix present to swab. [facility] I have called their office to get her seen again. A PET scan has been ordered.  Bilateral lower extremity edema Patient complains of swelling in her legs. She does note that it is uncomfortable, but has not noticed any skin breakdown at this time.  2+ pitting edema today. No evidence of wound/ulcer, splitting of skin, weeping, or dermatitis.  I do think that given this patient's comorbid conditions, if she were to develop wounds on her legs, they were heal poorly with high risk of complications. For this reason, I think we should be proactive managing her edema. She has not responded to lasix or spironolactone, but I did recommend some compression stocking and elevation of her extremities. We will need to closely monitor LE at each visit, and it may be worth while to consider early wound care referral if she does begin to develop wounds.    Patient discussed with Dr. Cleda Daub

## 2022-04-23 ENCOUNTER — Other Ambulatory Visit (HOSPITAL_COMMUNITY): Payer: Self-pay

## 2022-04-23 ENCOUNTER — Encounter (HOSPITAL_COMMUNITY): Payer: Self-pay | Admitting: Internal Medicine

## 2022-04-23 ENCOUNTER — Ambulatory Visit (HOSPITAL_COMMUNITY)
Admission: RE | Admit: 2022-04-23 | Discharge: 2022-04-23 | Disposition: A | Discharge status: Home / Self Care | Payer: Medicaid Other | Attending: Internal Medicine | Admitting: Internal Medicine

## 2022-04-23 ENCOUNTER — Encounter: Payer: Self-pay | Admitting: Internal Medicine

## 2022-04-23 ENCOUNTER — Ambulatory Visit (HOSPITAL_BASED_OUTPATIENT_CLINIC_OR_DEPARTMENT_OTHER): Payer: Medicaid Other | Admitting: Anesthesiology

## 2022-04-23 ENCOUNTER — Encounter (HOSPITAL_COMMUNITY)
Admission: RE | Disposition: A | Discharge status: Home / Self Care | Payer: Self-pay | Source: Home / Self Care | Attending: Internal Medicine

## 2022-04-23 ENCOUNTER — Ambulatory Visit (HOSPITAL_COMMUNITY): Payer: Medicaid Other | Admitting: Anesthesiology

## 2022-04-23 ENCOUNTER — Other Ambulatory Visit: Payer: Self-pay

## 2022-04-23 DIAGNOSIS — I8501 Esophageal varices with bleeding: Secondary | ICD-10-CM

## 2022-04-23 DIAGNOSIS — K3189 Other diseases of stomach and duodenum: Secondary | ICD-10-CM

## 2022-04-23 DIAGNOSIS — Z08 Encounter for follow-up examination after completed treatment for malignant neoplasm: Secondary | ICD-10-CM | POA: Insufficient documentation

## 2022-04-23 DIAGNOSIS — Z8619 Personal history of other infectious and parasitic diseases: Secondary | ICD-10-CM | POA: Insufficient documentation

## 2022-04-23 DIAGNOSIS — I8500 Esophageal varices without bleeding: Secondary | ICD-10-CM

## 2022-04-23 DIAGNOSIS — K766 Portal hypertension: Secondary | ICD-10-CM | POA: Diagnosis not present

## 2022-04-23 DIAGNOSIS — R188 Other ascites: Secondary | ICD-10-CM

## 2022-04-23 DIAGNOSIS — Z09 Encounter for follow-up examination after completed treatment for conditions other than malignant neoplasm: Secondary | ICD-10-CM | POA: Diagnosis not present

## 2022-04-23 DIAGNOSIS — B1920 Unspecified viral hepatitis C without hepatic coma: Secondary | ICD-10-CM | POA: Diagnosis not present

## 2022-04-23 DIAGNOSIS — D72818 Other decreased white blood cell count: Secondary | ICD-10-CM | POA: Insufficient documentation

## 2022-04-23 DIAGNOSIS — K7290 Hepatic failure, unspecified without coma: Secondary | ICD-10-CM

## 2022-04-23 DIAGNOSIS — I8510 Secondary esophageal varices without bleeding: Secondary | ICD-10-CM | POA: Insufficient documentation

## 2022-04-23 DIAGNOSIS — Z8541 Personal history of malignant neoplasm of cervix uteri: Secondary | ICD-10-CM | POA: Insufficient documentation

## 2022-04-23 DIAGNOSIS — K7460 Unspecified cirrhosis of liver: Secondary | ICD-10-CM

## 2022-04-23 DIAGNOSIS — Z87891 Personal history of nicotine dependence: Secondary | ICD-10-CM

## 2022-04-23 DIAGNOSIS — I2510 Atherosclerotic heart disease of native coronary artery without angina pectoris: Secondary | ICD-10-CM | POA: Diagnosis not present

## 2022-04-23 DIAGNOSIS — Z79899 Other long term (current) drug therapy: Secondary | ICD-10-CM | POA: Insufficient documentation

## 2022-04-23 HISTORY — PX: ESOPHAGEAL BANDING: SHX5518

## 2022-04-23 HISTORY — PX: ESOPHAGOGASTRODUODENOSCOPY (EGD) WITH PROPOFOL: SHX5813

## 2022-04-23 LAB — CMP14 + ANION GAP
ALT: 51 IU/L — ABNORMAL HIGH (ref 0–32)
AST: 109 IU/L — ABNORMAL HIGH (ref 0–40)
Albumin/Globulin Ratio: 1 — ABNORMAL LOW (ref 1.2–2.2)
Albumin: 3.3 g/dL — ABNORMAL LOW (ref 3.9–4.9)
Alkaline Phosphatase: 242 IU/L — ABNORMAL HIGH (ref 44–121)
Anion Gap: 13 mmol/L (ref 10.0–18.0)
BUN/Creatinine Ratio: 20 (ref 12–28)
BUN: 12 mg/dL (ref 8–27)
Bilirubin Total: 2.9 mg/dL — ABNORMAL HIGH (ref 0.0–1.2)
CO2: 17 mmol/L — ABNORMAL LOW (ref 20–29)
Calcium: 8 mg/dL — ABNORMAL LOW (ref 8.7–10.3)
Chloride: 95 mmol/L — ABNORMAL LOW (ref 96–106)
Creatinine, Ser: 0.6 mg/dL (ref 0.57–1.00)
Globulin, Total: 3.4 g/dL (ref 1.5–4.5)
Glucose: 110 mg/dL — ABNORMAL HIGH (ref 70–99)
Potassium: 3.6 mmol/L (ref 3.5–5.2)
Sodium: 125 mmol/L — ABNORMAL LOW (ref 134–144)
Total Protein: 6.7 g/dL (ref 6.0–8.5)
eGFR: 102 mL/min/{1.73_m2} (ref >59)

## 2022-04-23 SURGERY — ESOPHAGOGASTRODUODENOSCOPY (EGD) WITH PROPOFOL
Anesthesia: Monitor Anesthesia Care

## 2022-04-23 MED ORDER — SODIUM CHLORIDE 0.9 % IV SOLN: INTRAVENOUS | Status: DC

## 2022-04-23 MED ORDER — PROPOFOL 500 MG/50ML IV EMUL
INTRAVENOUS | Status: DC | PRN
  Administered 2022-04-23: 150 ug/kg/min via INTRAVENOUS

## 2022-04-23 MED ORDER — NADOLOL 20 MG PO TABS
20.0000 mg | ORAL_TABLET | Freq: Every day | ORAL | 11 refills | Status: DC
  Filled 2022-04-23: qty 30, 30d supply, fill #0
  Filled 2022-04-25: qty 90, 90d supply, fill #0
  Filled 2022-05-19: qty 90, 90d supply, fill #1

## 2022-04-23 MED ORDER — LIDOCAINE 2% (20 MG/ML) 5 ML SYRINGE
INTRAMUSCULAR | Status: DC | PRN
  Administered 2022-04-23: 30 mg via INTRAVENOUS

## 2022-04-23 MED ORDER — LACTATED RINGERS IV SOLN: INTRAVENOUS | Status: DC

## 2022-04-23 SURGICAL SUPPLY — 15 items

## 2022-04-23 NOTE — Anesthesia Procedure Notes (Signed)
Procedure Name: MAC Date/Time: 04/23/2022 10:38 AM  Performed by: De Nurse, CRNAPre-anesthesia Checklist: Patient identified, Emergency Drugs available, Suction available, Patient being monitored and Timeout performed Patient Re-evaluated:Patient Re-evaluated prior to induction Oxygen Delivery Method: Nasal cannula

## 2022-04-23 NOTE — Discharge Instructions (Signed)
YOU HAD AN ENDOSCOPIC PROCEDURE TODAY: Refer to the procedure report and other information in the discharge instructions given to you for any specific questions about what was found during the examination. If this information does not answer your questions, please call [place] office at [phone] to clarify.  ° °YOU SHOULD EXPECT: Some feelings of bloating in the abdomen. Passage of more gas than usual. Walking can help get rid of the air that was put into your GI tract during the procedure and reduce the bloating. If you had a lower endoscopy (such as a colonoscopy or flexible sigmoidoscopy) you may notice spotting of blood in your stool or on the toilet paper. Some abdominal soreness may be present for a day or two, also. ° °DIET: Your first meal following the procedure should be a light meal and then it is ok to progress to your normal diet. A half-sandwich or bowl of soup is an example of a good first meal. Heavy or fried foods are harder to digest and may make you feel nauseous or bloated. Drink plenty of fluids but you should avoid alcoholic beverages for 24 hours. If you had a esophageal dilation, please see attached instructions for diet.   ° °ACTIVITY: Your care partner should take you home directly after the procedure. You should plan to take it easy, moving slowly for the rest of the day. You can resume normal activity the day after the procedure however YOU SHOULD NOT DRIVE, use power tools, machinery or perform tasks that involve climbing or major physical exertion for 24 hours (because of the sedation medicines used during the test).  ° °SYMPTOMS TO REPORT IMMEDIATELY: °A gastroenterologist can be reached at any hour. Please call [phone]  for any of the following symptoms:  °Following lower endoscopy (colonoscopy, flexible sigmoidoscopy) °Excessive amounts of blood in the stool  °Significant tenderness, worsening of abdominal pains  °Swelling of the abdomen that is new, acute  °Fever of 100° or  higher  °Following upper endoscopy (EGD, EUS, ERCP, esophageal dilation) °Vomiting of blood or coffee ground material  °New, significant abdominal pain  °New, significant chest pain or pain under the shoulder blades  °Painful or persistently difficult swallowing  °New shortness of breath  °Black, tarry-looking or red, bloody stools ° °FOLLOW UP:  °If any biopsies were taken you will be contacted by phone or by letter within the next 1-3 weeks. Call [phone]  if you have not heard about the biopsies in 3 weeks.  °Please also call with any specific questions about appointments or follow up tests. ° °

## 2022-04-23 NOTE — Op Note (Signed)
[facility] Patient Name: Lorielle Boehning Procedure Date : 04/23/2022 MRN: [id number] Attending MD: Beverley Fiedler , MD, [id number] Date of Birth: March 19, 1960 CSN: [id number] Age: 62 Admit Type: Outpatient Procedure:                Upper GI endoscopy Indications:              2nd degree variceal eradication (following bleed);                            [facility]L on 03/27/22; decompensated Hep C cirrhosis with                            portal HTN Providers:                Carie Caddy. Rhea Belton, MD, Fransisca Connors, Rozetta Nunnery, Technician Referring MD:             Adron Bene Medicines:                Monitored Anesthesia Care Complications:            No immediate complications. Estimated Blood Loss:     Estimated blood loss: none. Procedure:                Pre-Anesthesia Assessment:                           - Prior to the procedure, a History and Physical                            was performed, and patient medications and                            allergies were reviewed. The patient's tolerance of                            previous anesthesia was also reviewed. The risks                            and benefits of the procedure and the sedation                            options and risks were discussed with the patient.                            All questions were answered, and informed consent                            was obtained. Prior Anticoagulants: The patient has                            taken no anticoagulant or antiplatelet agents. ASA  Grade Assessment: III - A patient with severe                            systemic disease. After reviewing the risks and                            benefits, the patient was deemed in satisfactory                            condition to undergo the procedure.                           After obtaining informed consent, the endoscope was                            passed  under direct vision. Throughout the                            procedure, the patient's blood pressure, pulse, and                            oxygen saturations were monitored continuously. The                            [id number]-H190 ([id number]) Olympus endoscope was introduced                            through the mouth, and advanced to the second part                            of duodenum. The upper GI endoscopy was                            accomplished without difficulty. The patient                            tolerated the procedure well. Scope In: Scope Out: Findings:      Three columns of large (> 5 mm) varices with no bleeding and no stigmata       of recent bleeding were found in the lower third of the esophagus, 31 to       35 cm from the incisors. No red wale signs were present. Scarring from       prior treatment was visible. Five bands were successfully placed with       complete eradication, resulting in deflation of varices. There was no       bleeding at the end of the procedure.      Moderate portal hypertensive gastropathy was found in the cardia, in the       gastric fundus and in the gastric body.      There is no endoscopic evidence of varices in the cardia and in the       gastric fundus.      The examined duodenum was normal. Impression:               - Large (> 5  mm) esophageal varices with no                            bleeding and no stigmata of recent bleeding.                            Completely eradicated. Banded x 5.                           - Portal hypertensive gastropathy.                           - Normal examined duodenum.                           - No specimens collected. Moderate Sedation:      N/A Recommendation:           - Patient has a contact number available for                            emergencies. The signs and symptoms of potential                            delayed complications were discussed with the                             patient. Return to normal activities tomorrow.                            Written discharge instructions were provided to the                            patient.                           - Resume previous low sodium diet.                           - Continue present medications. Diuretics remain on                            hold due to low Na. Continue pantoprazole 40 mg                            twice daily and rifaximin 550 mg twice daily.                           - Add nadolol 20 mg once daily.                           - Repeat LVP with IV albumin next week (up to 8 L                            removed).                           -  Repeat upper endoscopy in 4 weeks for retreatment                            in outpatient hospital setting. Procedure Code(s):        --- Professional ---                           [phone], Esophagogastroduodenoscopy, flexible,                            transoral; with band ligation of esophageal/gastric                            varices Diagnosis Code(s):        --- Professional ---                           I85.00, Esophageal varices without bleeding                           K76.6, Portal hypertension                           K31.89, Other diseases of stomach and duodenum CPT copyright 2022 American Medical Association. All rights reserved. The codes documented in this report are preliminary and upon coder review may  be revised to meet current compliance requirements. Beverley Fiedler, MD 04/23/2022 11:20:54 AM This report has been signed electronically. Number of Addenda: 0

## 2022-04-23 NOTE — Anesthesia Preprocedure Evaluation (Signed)
Anesthesia Evaluation  Patient identified by MRN, date of birth, ID band Patient awake    Reviewed: Allergy & Precautions, H&P , NPO status , Patient's Chart, lab work & pertinent test results  Airway Mallampati: II   Neck ROM: full    Dental   Pulmonary former smoker   breath sounds clear to auscultation       Cardiovascular + CAD   Rhythm:regular Rate:Normal     Neuro/Psych    GI/Hepatic hiatal hernia,,,(+) Cirrhosis   Esophageal Varices    , Hepatitis -, C  Endo/Other    Renal/GU      Musculoskeletal   Abdominal   Peds  Hematology   Anesthesia Other Findings   Reproductive/Obstetrics                             Anesthesia Physical Anesthesia Plan  ASA: 3  Anesthesia Plan: MAC   Post-op Pain Management:    Induction: Intravenous  PONV Risk Score and Plan: 2 and Ondansetron, Dexamethasone, Midazolam and Treatment may vary due to age or medical condition  Airway Management Planned: Nasal Cannula  Additional Equipment:   Intra-op Plan:   Post-operative Plan:   Informed Consent: I have reviewed the patients History and Physical, chart, labs and discussed the procedure including the risks, benefits and alternatives for the proposed anesthesia with the patient or authorized representative who has indicated his/her understanding and acceptance.     Dental advisory given  Plan Discussed with: CRNA, Anesthesiologist and Surgeon  Anesthesia Plan Comments:        Anesthesia Quick Evaluation

## 2022-04-23 NOTE — Interval H&P Note (Signed)
History and Physical Interval Note: Patient presenting for upper endoscopy to repeat variceal banding protocol.  Exam 1 month ago with Dr. Meridee Score with EVL. She has been seen by [facility] and they are going to pursue transplant. Hepatology has recommended against TIPS given decompensated liver disease. Patient had large-volume paracentesis last Friday.  Hyponatremia has precluded the use of even low-dose diuretic.  The nature of the procedure, as well as the risks, benefits, and alternatives were carefully and thoroughly reviewed with the patient. Ample time for discussion and questions allowed. The patient understood, was satisfied, and agreed to proceed.   04/23/2022 9:38 AM  Marcy Salvo  has presented today for surgery, with the diagnosis of Cirrhosis/esophageal varices.  The various methods of treatment have been discussed with the patient and family. After consideration of risks, benefits and other options for treatment, the patient has consented to  Procedure(s): ESOPHAGOGASTRODUODENOSCOPY (EGD) WITH PROPOFOL (N/A) ESOPHAGEAL BANDING (N/A) as a surgical intervention.  The patient's history has been reviewed, patient examined, no change in status, stable for surgery.  I have reviewed the patient's chart and labs.  Questions were answered to the patient's satisfaction.     Carie Caddy Macarena Langseth

## 2022-04-23 NOTE — Assessment & Plan Note (Signed)
Recent variceal bleed with banding. Repeat EGD 4/17 with GI. Little benefit to propranolol per hepatology. 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 She did not tolerate lactulose.  Volume is managed with paracentesis weekly. Diuretic refractory with hyponatremia. She is quite volume overloaded today and uncomfortable so I ordered for a paracentesis to be done.  Not on abx for SBP prophylaxis.  Treatment of hepatitis C would require either 24 weeks of Epclusa or 12 weeks Epclusa and Ribavirin. This is being deferred given decompensation and poor synthetic function. High meld score so would possibly receive transplant offer prior to completion of treatment.  Her history of vaginal cancer is more pressing and must be addressed prior to transplant.

## 2022-04-23 NOTE — Assessment & Plan Note (Signed)
Pap smear performed. Physical exam is concerning for recurrence of cancer. Several nodular lesions present on right vaginal wall and one that appears to circumscribe her urethra. Friable mucosa that bleeds easily. She is s/p hysterectomy so no cervix present to swab. Previously followed with [facility]. I have called their office to get her seen again. A PET scan has been ordered.

## 2022-04-23 NOTE — Transfer of Care (Signed)
Immediate Anesthesia Transfer of Care Note  Patient: Akshara Blumenthal  Procedure(s) Performed: ESOPHAGOGASTRODUODENOSCOPY (EGD) WITH PROPOFOL ESOPHAGEAL BANDING  Patient Location: PACU  Anesthesia Type:MAC  Level of Consciousness: lethargic and responds to stimulation  Airway & Oxygen Therapy: Patient Spontanous Breathing  Post-op Assessment: Report given to RN  Post vital signs: Reviewed and stable  Last Vitals:  Vitals Value Taken Time  BP 125/90   Temp    Pulse 118 04/23/22 1109  Resp 22 04/23/22 1109  SpO2 96 % 04/23/22 1109  Vitals shown include unvalidated device data.  Last Pain:  Vitals:   04/23/22 0911  TempSrc: Temporal  PainSc: 0-No pain         Complications: No notable events documented.

## 2022-04-23 NOTE — Assessment & Plan Note (Signed)
Treatment being deferred until after transplant.

## 2022-04-24 ENCOUNTER — Telehealth: Payer: Self-pay | Admitting: Internal Medicine

## 2022-04-24 ENCOUNTER — Other Ambulatory Visit: Payer: Self-pay

## 2022-04-24 ENCOUNTER — Encounter (HOSPITAL_COMMUNITY): Payer: Self-pay | Admitting: Internal Medicine

## 2022-04-24 ENCOUNTER — Other Ambulatory Visit (HOSPITAL_COMMUNITY): Payer: Self-pay

## 2022-04-24 DIAGNOSIS — R188 Other ascites: Secondary | ICD-10-CM

## 2022-04-24 DIAGNOSIS — K7460 Unspecified cirrhosis of liver: Secondary | ICD-10-CM

## 2022-04-24 DIAGNOSIS — R600 Localized edema: Secondary | ICD-10-CM | POA: Insufficient documentation

## 2022-04-24 NOTE — Telephone Encounter (Signed)
Cancel TIPS IR consultation, [facility]ology evaluating for transplant and recommend against TIPS at this time They want her to at least finish and complete the transplant evaluation before consideration of TIPS  Okay to repeat paracentesis up to 8 L of fluid removed, 50 g of IV albumin, sent for cell count only May need to schedule this weekly at this point

## 2022-04-24 NOTE — Assessment & Plan Note (Signed)
Patient complains of swelling in her legs. She does note that it is uncomfortable, but has not noticed any skin breakdown at this time.  2+ pitting edema today. No evidence of wound/ulcer, splitting of skin, weeping, or dermatitis.  I do think that given this patient's comorbid conditions, if she were to develop wounds on her legs, they were heal poorly with high risk of complications. For this reason, I think we should be proactive managing her edema. She has not responded to lasix or spironolactone, but I did recommend some compression stocking and elevation of her extremities. We will need to closely monitor LE at each visit, and it may be worth while to consider early wound care referral if she does begin to develop wounds.

## 2022-04-24 NOTE — Telephone Encounter (Signed)
Patients insurance call stating they need prior authorization for Nadolol medication. Gave the phone number [phone] in case it is needed to call in authorization. Please advise. Thank you.

## 2022-04-24 NOTE — Addendum Note (Signed)
Addended by: Gust Rung on: 04/24/2022 03:40 PM   Modules accepted: Orders

## 2022-04-24 NOTE — Anesthesia Postprocedure Evaluation (Signed)
Anesthesia Post Note  Patient: [facility]  Procedure(s) Performed: ESOPHAGOGASTRODUODENOSCOPY (EGD) WITH PROPOFOL ESOPHAGEAL BANDING     Patient location during evaluation: PACU Anesthesia Type: MAC Level of consciousness: awake and alert Pain management: pain level controlled Vital Signs Assessment: post-procedure vital signs reviewed and stable Respiratory status: spontaneous breathing, nonlabored ventilation, respiratory function stable and patient connected to nasal cannula oxygen Cardiovascular status: stable and blood pressure returned to baseline Postop Assessment: no apparent nausea or vomiting Anesthetic complications: no   No notable events documented.  Last Vitals:  Vitals:   04/23/22 1140 04/23/22 1150  BP: 132/81 (!) 143/88  Pulse: 95 94  Resp: (!) 25 (!) 25  Temp:    SpO2: 99% 100%    Last Pain:  Vitals:   04/23/22 1150  TempSrc:   PainSc: 2                  Aisha Greenberger S

## 2022-04-24 NOTE — Telephone Encounter (Addendum)
TIPS consult cancelled. 4 orders for IR paracentesis entered in epic along with Albumin orders for each. Patient notified of recommendations via MyChart.

## 2022-04-24 NOTE — Progress Notes (Signed)
Internal Medicine Clinic Attending  Case discussed with the resident at the time of the visit.  We reviewed the resident's history and exam and pertinent patient test results.  I agree with the assessment, diagnosis, and plan of care documented in the resident's note.  

## 2022-04-25 ENCOUNTER — Other Ambulatory Visit: Payer: Self-pay

## 2022-04-25 ENCOUNTER — Other Ambulatory Visit (HOSPITAL_COMMUNITY): Payer: Self-pay

## 2022-04-25 ENCOUNTER — Encounter: Payer: Self-pay | Admitting: Internal Medicine

## 2022-04-25 ENCOUNTER — Ambulatory Visit (HOSPITAL_COMMUNITY)
Admission: RE | Admit: 2022-04-25 | Discharge: 2022-04-25 | Disposition: A | Discharge status: Home / Self Care | Payer: No Typology Code available for payment source | Source: Ambulatory Visit | Attending: Internal Medicine | Admitting: Internal Medicine

## 2022-04-25 ENCOUNTER — Telehealth: Payer: Self-pay

## 2022-04-25 DIAGNOSIS — K7290 Hepatic failure, unspecified without coma: Secondary | ICD-10-CM | POA: Diagnosis not present

## 2022-04-25 DIAGNOSIS — K7460 Unspecified cirrhosis of liver: Secondary | ICD-10-CM | POA: Diagnosis not present

## 2022-04-25 DIAGNOSIS — R188 Other ascites: Secondary | ICD-10-CM | POA: Diagnosis not present

## 2022-04-25 HISTORY — PX: IR PARACENTESIS: IMG2679

## 2022-04-25 LAB — BODY FLUID CELL COUNT WITH DIFFERENTIAL
Eos, Fluid: 0 %
Lymphs, Fluid: 73 %
Monocyte-Macrophage-Serous Fluid: 25 % — ABNORMAL LOW (ref 50–90)
Neutrophil Count, Fluid: 2 % (ref 0–25)
Total Nucleated Cell Count, Fluid: 14 cu mm (ref 0–1000)

## 2022-04-25 LAB — CYTOLOGY - PAP
Comment: NEGATIVE
Diagnosis: UNDETERMINED — AB
High risk HPV: NEGATIVE

## 2022-04-25 MED ORDER — LIDOCAINE HCL (PF) 1 % IJ SOLN: 10.0000 mL | Freq: Once | INTRAMUSCULAR | Status: DC

## 2022-04-25 MED ORDER — ALBUMIN HUMAN 25 % IV SOLN
INTRAVENOUS | Status: AC
  Filled 2022-04-25: qty 200

## 2022-04-25 MED ORDER — ALBUMIN HUMAN 25 % IV SOLN
50.0000 g | INTRAVENOUS | Status: DC
  Administered 2022-04-25: 50 g via INTRAVENOUS
  Filled 2022-04-25: qty 200

## 2022-04-25 MED ORDER — LIDOCAINE HCL 1 % IJ SOLN
INTRAMUSCULAR | Status: AC
  Filled 2022-04-25: qty 20

## 2022-04-25 NOTE — Telephone Encounter (Signed)
Pa for pt [facility] ) came through on cover my meds .Marland Kitchen Was submitted with last  office notes ...      DECISION :    Message from Plan Prior Authorization Not Required

## 2022-04-25 NOTE — Procedures (Signed)
PROCEDURE SUMMARY:  Successful ultrasound guided paracentesis from the left  lower quadrant.  Yielded 8 L of straw colored fluid.  No immediate complications.  The patient tolerated the procedure well.   Specimen was sent for labs.  EBL < 2mL  The patient has previously been evaluated by the [facility], and deemed not a candidate for intervention.  Patient is being worked up for transplant per GI note

## 2022-04-28 ENCOUNTER — Encounter (HOSPITAL_COMMUNITY)
Admission: RE | Admit: 2022-04-28 | Discharge: 2022-04-28 | Disposition: A | Discharge status: Home / Self Care | Payer: No Typology Code available for payment source | Source: Ambulatory Visit | Attending: Nurse Practitioner | Admitting: Nurse Practitioner

## 2022-04-28 ENCOUNTER — Other Ambulatory Visit (HOSPITAL_COMMUNITY): Payer: Self-pay

## 2022-04-28 DIAGNOSIS — C52 Malignant neoplasm of vagina: Secondary | ICD-10-CM | POA: Insufficient documentation

## 2022-04-28 LAB — GLUCOSE, CAPILLARY: Glucose-Capillary: 114 mg/dL — ABNORMAL HIGH (ref 70–99)

## 2022-04-28 LAB — PATHOLOGIST SMEAR REVIEW: Path Review: NEGATIVE

## 2022-04-28 MED ORDER — FLUDEOXYGLUCOSE F - 18 (FDG) INJECTION
7.6000 | Freq: Once | INTRAVENOUS | Status: AC
  Administered 2022-04-28: 7.6 via INTRAVENOUS

## 2022-04-28 NOTE — Telephone Encounter (Signed)
Please see note below from Dr. Rhea Belton regarding the xifaxan.

## 2022-04-28 NOTE — Telephone Encounter (Signed)
[facility] given the patient has history of hepatic encephalopathy can you call her to make sure she knows the medication is approved and waiting for her Thanks JMP

## 2022-04-28 NOTE — Telephone Encounter (Signed)
We really need to continue rifaximin 550 mg twice daily for hepatic encephalopathy in this patient Can we try to get drug company assistance or patient assistance program initiated May need samples until this can be done

## 2022-04-28 NOTE — Telephone Encounter (Signed)
Medication covered under patients primary insurance. Will submit PA under medicaid.

## 2022-04-28 NOTE — Telephone Encounter (Signed)
A Prior Authorization was initiated for this patients XIFAXAN through CoverMyMeds.   Key: [id number]

## 2022-04-28 NOTE — Telephone Encounter (Signed)
Attempted to call pt and got message that voicemail box is full and cannot accept messages at this time. Will try again.

## 2022-04-28 NOTE — Telephone Encounter (Signed)
Prior Auth for patients medication XIFAXAN approved by [facility] [employer] from 04/28/22 to 04/28/23.  CoverMyMeds Key: [id number]

## 2022-04-29 ENCOUNTER — Other Ambulatory Visit (HOSPITAL_COMMUNITY): Payer: Self-pay

## 2022-04-29 NOTE — Telephone Encounter (Signed)
Per test claim PA is not needed, last filled 04/25/22

## 2022-04-29 NOTE — Progress Notes (Signed)
   Portal Hypertension Clinic Screening Evaluation   Indication for evaluation: Kathy Little is a 62 y.o. female undergoing preliminary evaluation in the [facility] Interventional Radiology Portal Hypertension Clinic due to recurrent ascites.  Referring Physician/Established Gastroenterologist: Dr. Erick Blinks, Annamarie Major, NP  Etiology of cirrhosis: Hepatitis C virus Initially diagnosed: December 2023 # of paracentesis in last month: 3 # of paracentesis in last 2 months: 5 History of hepatic hydrothorax:  no History of hepatic encephalopathy: yes  Prior evaluation for liver transplant: yes, ongoing History of hepatocellular carcinoma: no  Prior esophagogastroduodenoscopy/intervention:  03/19/21 - grade III esophageal varices, banded 04/22/21 -  grade III esophageal varices, banded Current esophageal varices: yes Current gastric varices: no History of hematemesis: yes  Current diuretic regimen: none - limited by hypotension Current pharmacologic encephalopathy prophylaxis/treatment: rifaximin 550 mg BID  History of renal dysfunction: no History of hemodialysis: no  History of cardiac dysfunction: yes, EF 45-50%  Other pertinent past medical history: hiatal hernia, vaginal cancer   Imaging: Prior cross sectional imaging of portal system: 05/21/22  Patent portal system, cirrhosis, ascites.  Small esophageal varices.  No portosystemic shunt or ectopic varices.  Echocardiogram:  03/20/22 IMPRESSIONS   1. Left ventricular ejection fraction, by estimation, is 45 to 50%. The  left ventricle has mildly decreased function. The left ventricle  demonstrates global hypokinesis. There is mild concentric left ventricular  hypertrophy. Left ventricular diastolic  parameters are indeterminate.   2. Right ventricular systolic function is normal. The right ventricular  size is normal.   3. The mitral valve is normal in structure. Trivial mitral valve  regurgitation. No evidence of  mitral stenosis.   4. The aortic valve is tricuspid. Aortic valve regurgitation is trivial.  Aortic valve sclerosis/calcification is present, without any evidence of  aortic stenosis.   5. Aortic dilatation noted. There is mild dilatation of the ascending  aorta, measuring 39 mm.    Labs: 04/1622 Creatinine: 0.60 Total Bilirubin: 2.9 INR: 1.6 Sodium: 125 Albumin: 3.3  Child-Pugh = 10 points, class C MELD = 16 (6.0% estimated 17-month mortality) [place] Index of Post-TIPS Survival (FIPS) = -0.82 (overall survival predicted 98% at 1 month, 93.3% at 3 months, and 90.1% at 6 months)    Assessment: Kathy Little is a 62 y.o. 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  After preliminary evaluation, this patient would likely benefit from TIPS creation for bleeding prevention and ascites management, particularly in light of intolerance of diuretics.  She is in the early stages of liver transplant evaluation.  If transplant is no an option in the near future or if hematemesis recurs, TIPS would be a good option.  Recommendation: Formal consult for TIPS creation could be considered as per Dr. Rhea Belton and Annamarie Major, NP discretion given ongoing transplant candidacy evaluation, as above.  The patient's Gastroenterologist, Dr. Rhea Belton, will be contacted for further discussion.    Electronically Signed: Bennie Dallas, MD 04/29/2022, 7:55 AM

## 2022-04-29 NOTE — Progress Notes (Signed)
Swab with ASCUS. Patient has upcoming appointment with her GYN ONC office 4/25 and will discuss findings with them at that time. She voices understanding.

## 2022-04-29 NOTE — Telephone Encounter (Signed)
Spoke with pt and she states she is aware and will go pick it up today.

## 2022-04-30 NOTE — Progress Notes (Unsigned)
GYNECOLOGIC ONCOLOGY NEW PATIENT CONSULTATION   Patient Name: Kathy Little  Patient Age: 62 y.o. Date of Service: 05/01/22 Referring Provider: Adron Bene, MD [address] 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  From a cancer standpoint, the patient is doing very well and is asymptomatic.  Unfortunately, she is quite symptomatic secondary to reaccumulation of ascites in the setting of cirrhosis.  She is scheduled to have a paracentesis tomorrow.  We reviewed recent records.  She had a Pap smear performed with her PCP that was ASCUS, high risk HPV negative.  We discussed that it is very common to see an ASCUS Pap after radiation.  Exam findings today are consistent with prior radiation therapy.  She has telangiectasias but no nodularity or masses.  Given low FDG activity on PET scan and in the setting of workup to see if she is a candidate for liver transplant, I performed a biopsy today to definitively rule out any recurrent cancer.  Again, I stressed to the patient that my concern is exceptionally low given her expected exam findings after treatment.  I will call her with biopsy results once back.  In terms of surveillance, we discussed continued yearly visits either in our office or with an OB/GYN to include an exam, cotesting (vaginal Pap and HPV testing), and review of symptoms.  If the patient would like to be seen in our clinic, I asked her to call around this time next year to schedule an appointment with our nurse practitioner, Kathy Little.  I discussed signs and symptoms that would be concerning for cancer recurrence and stressed the importance of calling if she develops  any of these.  Given some spotting after recent speculum exam and as well as exam findings today of vaginal atrophy, I discussed starting vaginal estrogen with the patient.  She was interested in this and a prescription was sent to her pharmacy today.  A copy of this note was sent to the patient's referring provider.   55 minutes of total time was spent for this patient encounter, including preparation, face-to-face counseling with the patient and coordination of care, and documentation of the encounter.  Kathy Garnet, MD  Division of Gynecologic Oncology  Department of Obstetrics and Gynecology  [facility]  ___________________________________________  Chief Complaint: Chief Complaint  Patient presents with   Malignant neoplasm of cervix, unspecified site    History of Present Illness:  Kathy Little is a 62 y.o. y.o. female who is seen in consultation at the request of Kathy Bene, MD for an evaluation of possible recurrence of vaginal cancer.  Patient has a history of decompensated cirrhosis in the setting of incompletely treated hepatitis C.  She was recently diagnosed with a variceal bleed.  She has been referred to [facility] for consideration of liver transplant.  CT scan was recently performed as well as PET to evaluate for recurrence of her prior gynecologic cancer.  CT of the abdomen and pelvis in January of this year showed interval development of advanced hepatic cirrhosis with large amount of ascites and diffuse soft tissue edema.  Mild diffuse small bowel wall thickening, likely related to underlying liver disease.  No evidence of metastatic disease.  Recent PET scan on  04/28/22 reveals mild lower vaginal/perineal soft tissue thickening with mild hypermetabolism, max SUV 3.8.  This is considered equivocal but thought to correspond with possible recurrence.  Cirrhosis also noted with moderate to large abdominopelvic ascites.  Pap test was  performed at her recent visit on 4/16.  This revealed ASCUS pap, HR HPV negative.  Today, the patient reports she is doing well.  She denies any symptoms whatsoever related to her cancer history including vaginal bleeding, discharge, or vaginal/vulvar pain.  She endorses normal at least daily bowel function.  She has some urinary symptoms noting that she has to push to void now related to buildup of ascites.  This is better after she has a paracentesis.  She has been getting a paracentesis weekly.  Treatment history: The patient has a remote history of a laparoscopic total hysterectomy, BSO in [place] by Kathy Little in 2012 for cervical dysplasia and abnormal uterine bleeding. Postoperatively she was seen once for "office laser", but did not have followup thereafter.   She began noticing brown vaginal discharge 3 weeks ago. This prompted her to be see by Kathy Little who performed an examination and pap smear (8.3.16). Results of the pap smear showed high risk HPV present and squamous cells showing squamous cell carcinoma.   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   Pretreatment PET showed no metastatic foci.   She was recommended to undergo treatment with primary chemoradiation for presumed recurrent cervical cancer.  She could not receive radiosensitizing cisplatin due to thrombocytopenia. She completed external beam pelvic RT between 10/10/14 - 11/13/14 (45 Gy in 25 fractions) and vaginal brachytherapy boost in 4 fractions completed 12/19/14.  She was noted to have had a complete response to treatment. The patient was last seen in our clinic in 04/2015. She was NED at that time.  PAST MEDICAL HISTORY:  Past Medical History:  Diagnosis Date   Atherosclerosis    Cirrhosis (HCC)    Hepatitis C 1997   Hepatitis C    Hiatal hernia    History of brachytherapy 11/28/14, 12/05/14. 12/12/14   proximal vagina 24 gray   Radiation 10/10/2014  through 11/07/201610/04/2014 through 11/13/2014   Pelvis 45 gray    Vaginal cancer (HCC)      PAST SURGICAL HISTORY:  Past Surgical History:  Procedure Laterality Date   COLPOSCOPY  08/30/14   and vaginal biopsy   ESOPHAGEAL BANDING  03/20/2022   Procedure: ESOPHAGEAL BANDING;  Surgeon: Lemar Lofty., MD;  Location[facility] ENDOSCOPY;  Service: Gastroenterology;;   ESOPHAGEAL BANDING N/A 04/23/2022   Procedure: ESOPHAGEAL BANDING;  Surgeon: Beverley Fiedler, MD;  Location: [facility] ENDOSCOPY;  Service: Gastroenterology;  Laterality: N/A;   ESOPHAGOGASTRODUODENOSCOPY (EGD) WITH PROPOFOL N/A 03/20/2022   Procedure: ESOPHAGOGASTRODUODENOSCOPY (EGD) WITH PROPOFOL;  Surgeon: Meridee Score Netty Starring., MD;  Location: [facility] ENDOSCOPY;  Service: Gastroenterology;  Laterality: N/A;   ESOPHAGOGASTRODUODENOSCOPY (EGD) WITH PROPOFOL N/A 04/23/2022   Procedure: ESOPHAGOGASTRODUODENOSCOPY (EGD) WITH PROPOFOL;  Surgeon: Beverley Fiedler, MD;  Location: [facility] ENDOSCOPY;  Service: Gastroenterology;  Laterality: N/A;   IR PARACENTESIS  01/22/2022   IR PARACENTESIS  02/21/2022   IR PARACENTESIS  03/21/2022   IR PARACENTESIS  03/24/2022   IR PARACENTESIS  04/03/2022   IR PARACENTESIS  04/18/2022   IR PARACENTESIS  04/25/2022   ROBOTIC ASSISTED LAPAROSCOPIC HYSTERECTOMY AND SALPINGECTOMY  2012   Kathy. Karie Little, [place]    OB/GYN HISTORY:  OB History  Gravida Para Term Preterm AB Living  2 2       3   SAB IAB Ectopic Multiple Live Births        1 3    # Outcome Date GA Lbr Len/2nd Weight Sex Delivery Anes PTL Lv  2A Para 09/05/93    M Vag-Spont   LIV  2B Para 09/05/93    F Vag-Spont   LIV  1 Para 11/12/92    F Vag-Spont   LIV    No LMP recorded. Patient has had a hysterectomy.  Age at menarche: 82 Age at menopause: 17 Hx of HRT: Denies Hx of STDs: HPV, Hep C Last pap: see HPI History of abnormal pap smears: yes  SCREENING STUDIES:  Last mammogram: n/a Last colonoscopy: n/a  MEDICATIONS: Outpatient Encounter Medications  as of 05/01/2022  Medication Sig   acetaminophen (TYLENOL) 500 MG tablet Take 1,000 mg by mouth daily as needed for moderate pain, fever or headache.   midodrine (PROAMATINE) 5 MG tablet Take 3 tablets (15 mg total) by mouth 3 (three) times daily with meals.   nadolol (CORGARD) 20 MG tablet Take 1 tablet (20 mg total) by mouth daily.   pantoprazole (PROTONIX) 40 MG tablet Take 1 tablet (40 mg total) by mouth 2 (two) times daily.   potassium chloride SA (KLOR-CON M) 20 MEQ tablet Take 1 tablet (20 mEq total) by mouth daily.   rifaximin (XIFAXAN) 550 MG TABS tablet Take 1 tablet (550 mg total) by mouth 2 (two) times daily.   sucralfate (CARAFATE) 1 GM/10ML suspension Take 10 mLs (1 g total) by mouth 2 (two) times daily.   sulfamethoxazole-trimethoprim (BACTRIM DS) 800-160 MG tablet Take 1 tablet by mouth daily.   [DISCONTINUED] conjugated estrogens (PREMARIN) vaginal cream Place 1 Applicatorful vaginally 3 (three) times a week. Please place small amount on your finger (don't use applicator) and insert into the vagina 2-3 times a week at night   [START ON 05/02/2022] conjugated estrogens (PREMARIN) vaginal cream Place small amount on your finger (don't use applicator) and insert into the vagina 2-3 times a week at night   Facility-Administered Encounter Medications as of 05/01/2022  Medication   albumin human 25 % solution 12.5 g    ALLERGIES:  Allergies  Allergen Reactions   Ampicillin Nausea And Vomiting   Flagyl [Metronidazole] Nausea Only     FAMILY HISTORY:  Family History  Problem Relation Age of Onset   Colon cancer Mother    Heart attack Father    Pancreatic cancer Maternal Grandfather    Rectal cancer Neg Hx    Stomach cancer Neg Hx    Esophageal cancer Neg Hx      SOCIAL HISTORY:  Social Connections: Not on file    REVIEW OF SYSTEMS:  + Chills, weight loss, yellowing of the eyes, shortness of breath, swelling of the legs, abdominal pain, diarrhea, joint pain, muscle  pain/cramps, itching, easy bruising/bleeding. Denies appetite changes, fevers, fatigue. Denies hearing loss, neck lumps or masses, mouth sores, ringing in ears or voice changes. Denies cough or wheezing.  Denies chest pain or palpitations.  Denies abdominal distention, blood in stools, constipation, nausea, vomiting, or early satiety. Denies pain with intercourse, dysuria, frequency, hematuria or incontinence. Denies hot flashes, pelvic pain, vaginal bleeding or vaginal discharge.   Denies back pain. Denies rash, or wounds. Denies dizziness, headaches, numbness or seizures. Denies swollen lymph nodes or glands. Denies anxiety, depression, confusion, or decreased concentration.  Physical Exam:  Vital Signs for this encounter:  Blood pressure (!) 117/59, pulse  63, temperature 97.8 F (36.6 C), temperature source Oral, weight 154 lb (69.9 kg), SpO2 100 %. Body mass index is 24.12 kg/m. General: Alert, oriented, no acute distress.  HEENT: Normocephalic, atraumatic. Sclera anicteric.  Chest: Clear to auscultation bilaterally. No wheezes, rhonchi, or rales. Cardiovascular: Regular rate and rhythm, no murmurs, rubs, or gallops.  Abdomen: Normoactive bowel sounds.  Somewhat tense, distended, mildly tender to palpation.  Fluid wave appreciated.  No masses appreciated. Extremities: Grossly normal range of motion. Warm, well perfused. 2+ edema bilaterally.  Skin: No rashes or lesions.  Lymphatics: No cervical, supraclavicular, or inguinal adenopathy.  GU:  Normal external female genitalia. No lesions. No discharge or bleeding.             Bladder/urethra:  No lesions or masses, well supported bladder             Vagina: The vagina itself has evidence of petechiae and radiation changes.  Moderate atrophy is noted.  No atypical vascularity or masses appreciated.  Vagina itself is foreshortened with some agglutination at the apex.             Cervix/uterus: surgically absent             Adnexa: No  masses appreciated.  Rectal: No nodularity or masses. External hemorrhoids noted.  Vaginal biopsy procedure Preoperative diagnosis: Radiation changes, history of vaginal cancer Postoperative diagnosis: Same as above Physician: Pricilla Holm MD Estimated blood loss: Minimal Specimens: Vaginal biopsy Procedure: The patient was already in dorsolithotomy position for an exam.  Findings notable for radiation changes.  Consent for biopsy.  The area along the hymen was identified for biopsy and cleansed with Betadine x3.  2 cc of 1% lidocaine was injected for local anesthesia and after adequate time, Tischler forceps were used to take a biopsy at approximately 8:00 along the hymen.  This was placed in formalin.  Silver nitrate and pressure were used to achieve hemostasis. Overall the patient tolerated the procedure well.    LABORATORY AND RADIOLOGIC DATA:  Outside medical records were reviewed to synthesize the above history, along with the history and physical obtained during the visit.   Lab Results  Component Value Date   WBC 4.8 04/08/2022   HGB 12.2 04/08/2022   HCT 35.8 (L) 04/08/2022   PLT 138.0 (L) 04/08/2022   GLUCOSE 110 (H) 04/22/2022   ALT 51 (H) 04/22/2022   AST 109 (H) 04/22/2022   NA 125 (L) 04/22/2022   K 3.6 04/22/2022   CL 95 (L) 04/22/2022   CREATININE 0.60 04/22/2022   BUN 12 04/22/2022   CO2 17 (L) 04/22/2022   INR 1.6 (H) 04/08/2022

## 2022-05-01 ENCOUNTER — Other Ambulatory Visit (HOSPITAL_COMMUNITY): Payer: Self-pay

## 2022-05-01 ENCOUNTER — Inpatient Hospital Stay: Payer: No Typology Code available for payment source | Attending: Gynecologic Oncology | Admitting: Gynecologic Oncology

## 2022-05-01 ENCOUNTER — Encounter: Payer: Self-pay | Admitting: Gynecologic Oncology

## 2022-05-01 ENCOUNTER — Other Ambulatory Visit: Payer: Self-pay

## 2022-05-01 ENCOUNTER — Encounter: Payer: Medicaid Other | Admitting: Internal Medicine

## 2022-05-01 VITALS — BP 117/59 | HR 63 | Temp 97.8°F | Wt 154.0 lb

## 2022-05-01 DIAGNOSIS — Z90710 Acquired absence of both cervix and uterus: Secondary | ICD-10-CM | POA: Insufficient documentation

## 2022-05-01 DIAGNOSIS — Z1151 Encounter for screening for human papillomavirus (HPV): Secondary | ICD-10-CM | POA: Diagnosis not present

## 2022-05-01 DIAGNOSIS — R188 Other ascites: Secondary | ICD-10-CM | POA: Diagnosis not present

## 2022-05-01 DIAGNOSIS — Z8544 Personal history of malignant neoplasm of other female genital organs: Secondary | ICD-10-CM | POA: Diagnosis not present

## 2022-05-01 DIAGNOSIS — Z923 Personal history of irradiation: Secondary | ICD-10-CM | POA: Insufficient documentation

## 2022-05-01 DIAGNOSIS — C539 Malignant neoplasm of cervix uteri, unspecified: Secondary | ICD-10-CM

## 2022-05-01 DIAGNOSIS — K7460 Unspecified cirrhosis of liver: Secondary | ICD-10-CM | POA: Insufficient documentation

## 2022-05-01 DIAGNOSIS — Z90722 Acquired absence of ovaries, bilateral: Secondary | ICD-10-CM | POA: Insufficient documentation

## 2022-05-01 DIAGNOSIS — R87610 Atypical squamous cells of undetermined significance on cytologic smear of cervix (ASC-US): Secondary | ICD-10-CM

## 2022-05-01 DIAGNOSIS — N952 Postmenopausal atrophic vaginitis: Secondary | ICD-10-CM | POA: Diagnosis not present

## 2022-05-01 MED ORDER — ESTROGENS CONJUGATED 0.625 MG/GM VA CREA
1.0000 | TOPICAL_CREAM | VAGINAL | 1 refills | Status: DC
Start: 2022-05-02 — End: 2022-05-01
  Filled 2022-05-01: qty 42.5, 49d supply, fill #0

## 2022-05-01 MED ORDER — ESTROGENS CONJUGATED 0.625 MG/GM VA CREA
1.0000 | TOPICAL_CREAM | VAGINAL | 1 refills | Status: DC
Start: 2022-05-02 — End: 2022-06-28
  Filled 2022-05-01: qty 30, 90d supply, fill #0
  Filled 2022-05-19: qty 30, 90d supply, fill #1

## 2022-05-01 NOTE — Patient Instructions (Addendum)
It was very nice to meet you today.  I will call you with your biopsy results, which will likely be back either tomorrow or early next week.  Based on my exam today, do not see evidence of cancer recurrence.  The changes that I see are related to your prior radiation.  I would recommend a yearly visit with an OB/GYN or in our clinic with Warner Mccreedy, our nurse practitioner.  This yearly exam should include a Pap test and HPV testing.  If you develop any symptoms in between visits, such as vaginal bleeding, pelvic pain, change to bowel or bladder function, please call to see [place] sooner.

## 2022-05-02 ENCOUNTER — Ambulatory Visit (HOSPITAL_COMMUNITY)
Admission: RE | Admit: 2022-05-02 | Discharge: 2022-05-02 | Disposition: A | Discharge status: Home / Self Care | Payer: No Typology Code available for payment source | Source: Ambulatory Visit | Attending: Internal Medicine | Admitting: Internal Medicine

## 2022-05-02 DIAGNOSIS — K7290 Hepatic failure, unspecified without coma: Secondary | ICD-10-CM | POA: Diagnosis not present

## 2022-05-02 DIAGNOSIS — K7460 Unspecified cirrhosis of liver: Secondary | ICD-10-CM | POA: Insufficient documentation

## 2022-05-02 DIAGNOSIS — R188 Other ascites: Secondary | ICD-10-CM | POA: Insufficient documentation

## 2022-05-02 HISTORY — PX: IR PARACENTESIS: IMG2679

## 2022-05-02 LAB — BODY FLUID CELL COUNT WITH DIFFERENTIAL
Eos, Fluid: 0 %
Lymphs, Fluid: 66 %
Monocyte-Macrophage-Serous Fluid: 34 % — ABNORMAL LOW (ref 50–90)
Neutrophil Count, Fluid: 0 % (ref 0–25)
Total Nucleated Cell Count, Fluid: 27 uL (ref 0–1000)

## 2022-05-02 MED ORDER — ALBUMIN HUMAN 25 % IV SOLN
50.0000 g | Freq: Once | INTRAVENOUS | Status: AC
  Administered 2022-05-02: 50 g via INTRAVENOUS
  Filled 2022-05-02: qty 200

## 2022-05-02 MED ORDER — LIDOCAINE HCL 1 % IJ SOLN: 10.0000 mL | Freq: Once | INTRAMUSCULAR | Status: DC

## 2022-05-02 MED ORDER — ALBUMIN HUMAN 25 % IV SOLN
INTRAVENOUS | Status: AC
  Filled 2022-05-02: qty 200

## 2022-05-02 MED ORDER — LIDOCAINE HCL 1 % IJ SOLN
INTRAMUSCULAR | Status: AC
  Filled 2022-05-02: qty 20

## 2022-05-02 NOTE — Procedures (Signed)
PROCEDURE SUMMARY:  Successful ultrasound guided paracentesis from the left lower  quadrant.  Yielded 8L  of straw colored fluid.  No immediate complications.  The patient tolerated the procedure well.   Specimen was sent for labs.  EBL < 2mL  The patient has previously been formally evaluated by the [facility] Interventional Radiology Portal Hypertension Clinic and is being actively followed for potential future intervention.

## 2022-05-05 ENCOUNTER — Telehealth: Payer: Self-pay | Admitting: Gynecologic Oncology

## 2022-05-05 LAB — SURGICAL PATHOLOGY

## 2022-05-05 LAB — PATHOLOGIST SMEAR REVIEW

## 2022-05-05 NOTE — Telephone Encounter (Signed)
Called patient re negative biopsy from last week. She is very happy with this news.  Eugene Garnet MD Gynecologic Oncology

## 2022-05-06 ENCOUNTER — Encounter: Payer: Self-pay | Admitting: Internal Medicine

## 2022-05-09 ENCOUNTER — Ambulatory Visit (HOSPITAL_COMMUNITY)
Admission: RE | Admit: 2022-05-09 | Discharge: 2022-05-09 | Disposition: A | Discharge status: Home / Self Care | Payer: No Typology Code available for payment source | Source: Ambulatory Visit | Attending: Internal Medicine | Admitting: Internal Medicine

## 2022-05-09 DIAGNOSIS — R188 Other ascites: Secondary | ICD-10-CM | POA: Insufficient documentation

## 2022-05-09 DIAGNOSIS — K7460 Unspecified cirrhosis of liver: Secondary | ICD-10-CM

## 2022-05-09 DIAGNOSIS — K7290 Hepatic failure, unspecified without coma: Secondary | ICD-10-CM

## 2022-05-09 HISTORY — PX: IR PARACENTESIS: IMG2679

## 2022-05-09 LAB — BODY FLUID CELL COUNT WITH DIFFERENTIAL
Eos, Fluid: 0 %
Lymphs, Fluid: 60 %
Monocyte-Macrophage-Serous Fluid: 38 % — ABNORMAL LOW (ref 50–90)
Neutrophil Count, Fluid: 2 % (ref 0–25)
Total Nucleated Cell Count, Fluid: 30 cu mm (ref 0–1000)

## 2022-05-09 MED ORDER — LIDOCAINE HCL 1 % IJ SOLN
INTRAMUSCULAR | Status: AC
  Filled 2022-05-09: qty 20

## 2022-05-09 NOTE — Procedures (Signed)
ROCEDURE SUMMARY:   Successful US guided paracentesis from right latearl abomen.  Yielded 8.5 liters of clear, yellow fluid.  No immediate complications.  Pt tolerated well.    Specimen was sent for labs.   EBL < 5mL   PHC Update:   Patient followed at [facility] for possible transplant.   Hoyt Koch PA-C 05/09/2022 12:20 PM

## 2022-05-12 LAB — PATHOLOGIST SMEAR REVIEW: Path Review: NEGATIVE

## 2022-05-13 ENCOUNTER — Other Ambulatory Visit: Payer: Self-pay | Admitting: Nurse Practitioner

## 2022-05-13 ENCOUNTER — Encounter: Payer: Self-pay | Admitting: Internal Medicine

## 2022-05-13 DIAGNOSIS — Z0000 Encounter for general adult medical examination without abnormal findings: Secondary | ICD-10-CM

## 2022-05-14 ENCOUNTER — Other Ambulatory Visit: Payer: Self-pay

## 2022-05-14 ENCOUNTER — Ambulatory Visit (HOSPITAL_COMMUNITY)
Admission: RE | Admit: 2022-05-14 | Discharge: 2022-05-14 | Disposition: A | Discharge status: Home / Self Care | Payer: No Typology Code available for payment source | Source: Ambulatory Visit | Attending: Internal Medicine | Admitting: Internal Medicine

## 2022-05-14 ENCOUNTER — Emergency Department (HOSPITAL_COMMUNITY)
Admission: EM | Admit: 2022-05-14 | Discharge: 2022-05-15 | Disposition: A | Discharge status: Home / Self Care | Payer: No Typology Code available for payment source | Attending: Emergency Medicine | Admitting: Emergency Medicine

## 2022-05-14 DIAGNOSIS — R188 Other ascites: Secondary | ICD-10-CM | POA: Insufficient documentation

## 2022-05-14 DIAGNOSIS — R1030 Lower abdominal pain, unspecified: Secondary | ICD-10-CM | POA: Insufficient documentation

## 2022-05-14 DIAGNOSIS — K7290 Hepatic failure, unspecified without coma: Secondary | ICD-10-CM | POA: Insufficient documentation

## 2022-05-14 DIAGNOSIS — R110 Nausea: Secondary | ICD-10-CM | POA: Insufficient documentation

## 2022-05-14 DIAGNOSIS — K7460 Unspecified cirrhosis of liver: Secondary | ICD-10-CM | POA: Insufficient documentation

## 2022-05-14 DIAGNOSIS — Z8542 Personal history of malignant neoplasm of other parts of uterus: Secondary | ICD-10-CM | POA: Insufficient documentation

## 2022-05-14 DIAGNOSIS — R109 Unspecified abdominal pain: Secondary | ICD-10-CM

## 2022-05-14 HISTORY — PX: IR PARACENTESIS: IMG2679

## 2022-05-14 LAB — BODY FLUID CELL COUNT WITH DIFFERENTIAL
Eos, Fluid: 0 %
Lymphs, Fluid: 44 %
Monocyte-Macrophage-Serous Fluid: 51 % (ref 50–90)
Neutrophil Count, Fluid: 5 % (ref 0–25)
Total Nucleated Cell Count, Fluid: 27 cu mm (ref 0–1000)

## 2022-05-14 MED ORDER — LIDOCAINE HCL (PF) 1 % IJ SOLN: 10.0000 mL | Freq: Once | INTRAMUSCULAR | Status: DC

## 2022-05-14 MED ORDER — LIDOCAINE HCL 1 % IJ SOLN
INTRAMUSCULAR | Status: AC
  Filled 2022-05-14: qty 20

## 2022-05-14 NOTE — ED Triage Notes (Signed)
Pt BIB [facility] from home d/t bilateral lower abdominal pain that started this afternoon. Per note pt had removal of fluid s/t hx of cirrhosis.

## 2022-05-14 NOTE — Procedures (Signed)
PROCEDURE SUMMARY:  Successful image-guided paracentesis from the left lower abdomen.  Yielded 8.0 liters of clear light yellow fluid.  No immediate complications.  EBL: zero Patient tolerated well.   Specimen was sent for labs.  Please see imaging section of Epic for full dictation.  Patient is followed by [facility] for possible transplant. Per Dr. Jerrye Noble evaluation 04/25/22: "Formal consult for TIPS creation could be considered as per Dr. Rhea Belton and Annamarie Major, NP discretion given ongoing transplant candidacy evaluation, as above.  The patient's Gastroenterologist, Dr. Rhea Belton, will be contacted for further discussion "  Villa Herb PA-C 05/14/2022 9:19 AM

## 2022-05-15 ENCOUNTER — Other Ambulatory Visit (HOSPITAL_COMMUNITY): Payer: Self-pay

## 2022-05-15 LAB — COMPREHENSIVE METABOLIC PANEL
ALT: 43 U/L (ref 0–44)
AST: 88 U/L — ABNORMAL HIGH (ref 15–41)
Albumin: 2.4 g/dL — ABNORMAL LOW (ref 3.5–5.0)
Alkaline Phosphatase: 209 U/L — ABNORMAL HIGH (ref 38–126)
Anion gap: 7 (ref 5–15)
BUN: 13 mg/dL (ref 8–23)
CO2: 19 mmol/L — ABNORMAL LOW (ref 22–32)
Calcium: 8.1 mg/dL — ABNORMAL LOW (ref 8.9–10.3)
Chloride: 100 mmol/L (ref 98–111)
Creatinine, Ser: 0.74 mg/dL (ref 0.44–1.00)
GFR, Estimated: >60 mL/min (ref >60)
Glucose, Bld: 111 mg/dL — ABNORMAL HIGH (ref 70–99)
Potassium: 4.8 mmol/L (ref 3.5–5.1)
Sodium: 126 mmol/L — ABNORMAL LOW (ref 135–145)
Total Bilirubin: 2.8 mg/dL — ABNORMAL HIGH (ref 0.3–1.2)
Total Protein: 6.3 g/dL — ABNORMAL LOW (ref 6.5–8.1)

## 2022-05-15 LAB — CBC
HCT: 36.1 % (ref 36.0–46.0)
Hemoglobin: 12.1 g/dL (ref 12.0–15.0)
MCH: 30.9 pg (ref 26.0–34.0)
MCHC: 33.5 g/dL (ref 30.0–36.0)
MCV: 92.1 fL (ref 80.0–100.0)
Platelets: 122 10*3/uL — ABNORMAL LOW (ref 150–400)
RBC: 3.92 MIL/uL (ref 3.87–5.11)
RDW: 17.9 % — ABNORMAL HIGH (ref 11.5–15.5)
WBC: 4.1 10*3/uL (ref 4.0–10.5)
nRBC: 0.5 % — ABNORMAL HIGH (ref 0.0–0.2)

## 2022-05-15 LAB — URINALYSIS, ROUTINE W REFLEX MICROSCOPIC
Bacteria, UA: NONE SEEN
Bilirubin Urine: NEGATIVE
Glucose, UA: NEGATIVE mg/dL
Hgb urine dipstick: NEGATIVE
Ketones, ur: NEGATIVE mg/dL
Nitrite: NEGATIVE
Protein, ur: NEGATIVE mg/dL
Specific Gravity, Urine: 1.02 (ref 1.005–1.030)
pH: 5 (ref 5.0–8.0)

## 2022-05-15 LAB — LIPASE, BLOOD: Lipase: 66 U/L — ABNORMAL HIGH (ref 11–51)

## 2022-05-15 MED ORDER — LIDOCAINE VISCOUS HCL 2 % MT SOLN
15.0000 mL | Freq: Once | OROMUCOSAL | Status: AC
  Administered 2022-05-15: 15 mL via ORAL
  Filled 2022-05-15: qty 15

## 2022-05-15 MED ORDER — METOCLOPRAMIDE HCL 5 MG/ML IJ SOLN
10.0000 mg | Freq: Once | INTRAMUSCULAR | Status: AC
  Administered 2022-05-15: 10 mg via INTRAVENOUS
  Filled 2022-05-15: qty 2

## 2022-05-15 MED ORDER — ONDANSETRON 4 MG PO TBDP
4.0000 mg | ORAL_TABLET | Freq: Three times a day (TID) | ORAL | 0 refills | Status: DC | PRN
  Filled 2022-05-15: qty 15, 5d supply, fill #0
  Filled 2022-05-15: qty 15, 15d supply, fill #0

## 2022-05-15 MED ORDER — ALUM & MAG HYDROXIDE-SIMETH 200-200-20 MG/5ML PO SUSP
30.0000 mL | Freq: Once | ORAL | Status: AC
  Administered 2022-05-15: 30 mL via ORAL
  Filled 2022-05-15: qty 30

## 2022-05-15 NOTE — ED Notes (Signed)
Pt unable to urinate at this time.  

## 2022-05-15 NOTE — ED Notes (Signed)
Requested urine from pt. Pt states she is unable to provide urine at this time

## 2022-05-15 NOTE — ED Provider Notes (Signed)
[place] EMERGENCY DEPARTMENT AT [facility] Provider Note  CSN: [id number] Arrival date & time: 05/14/22 2345  Chief Complaint(s) Abdominal Pain  HPI Kathy Little is a 62 y.o. 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  Pain last anywhere from 10 to 20 minutes at a time and then completely resolves on its own.  She endorses nausea without emesis.  Denies any diarrhea constipation.  She denies any dysuria, frequency or urgency.  Denies any suspicious food intake.   On review of records, patient had a paracentesis performed earlier in the day.  Patient confirmed and stated that she felt well after the procedure.  The history is provided by the patient.    Past Medical History Past Medical History:  Diagnosis Date   Atherosclerosis    Cirrhosis (HCC)    Hepatitis C 1997   Hepatitis C    Hiatal hernia    History of brachytherapy 11/28/14, 12/05/14. 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 Authorizing Provider  ondansetron (ZOFRAN-ODT)  4 MG disintegrating tablet Take 1 tablet (4 mg total) by mouth every 8 (eight) hours as needed for up to 3 days for nausea or vomiting. 05/15/22 05/18/22 Yes Tamiko Leopard, Amadeo Garnet, MD  acetaminophen (TYLENOL) 500 MG tablet Take 1,000 mg by mouth daily as needed for moderate pain, fever or headache.    [provider]  conjugated estrogens (PREMARIN) vaginal cream Place small amount on your finger (don't use applicator) and insert into the vagina 2-3 times a week at night 05/02/22   Carver Fila, MD  midodrine (PROAMATINE) 5 MG tablet Take 3 tablets (15 mg total) by mouth 3 (three) times daily with meals. 04/22/22 05/24/22  Adron Bene, MD  nadolol (CORGARD) 20 MG tablet Take 1 tablet (20 mg total) by mouth daily. 04/23/22 04/23/23  Pyrtle, Carie Caddy, MD  pantoprazole (PROTONIX) 40 MG tablet Take 1 tablet (40 mg total) by mouth 2 (two) times daily. 04/22/22 05/24/22  Adron Bene, MD  potassium chloride SA (KLOR-CON M) 20 MEQ tablet Take 1 tablet (20 mEq total) by mouth daily. 04/22/22 05/24/22  Adron Bene, MD  rifaximin (XIFAXAN) 550 MG TABS tablet Take 1 tablet (550 mg total) by mouth 2 (two) times daily. 04/22/22 08/20/22  Adron Bene, MD  sucralfate (CARAFATE) 1 GM/10ML suspension Take 10 mLs (1 g total) by mouth 2 (two) times daily. 04/22/22 05/24/22  Adron Bene, MD  sulfamethoxazole-trimethoprim (BACTRIM DS) 800-160 MG tablet Take 1 tablet by mouth daily. 04/22/22 08/20/22  Adron Bene, MD  Allergies Ampicillin and Flagyl [metronidazole]  Review of Systems Review of Systems As noted in HPI  Physical Exam Vital Signs  I have reviewed the triage vital signs BP 114/73   Pulse (!) 111   Temp 98 F (36.7 C) (Oral)   Resp 17   SpO2 93%   Physical Exam Vitals reviewed.  Constitutional:      General: She is not in acute  distress.    Appearance: She is well-developed. She is not diaphoretic.  HENT:     Head: Normocephalic and atraumatic.     Right Ear: External ear normal.     Left Ear: External ear normal.     Nose: Nose normal.  Eyes:     General: No scleral icterus.    Conjunctiva/sclera: Conjunctivae normal.  Neck:     Trachea: Phonation normal.  Cardiovascular:     Rate and Rhythm: Normal rate and regular rhythm.  Pulmonary:     Effort: Pulmonary effort is normal. No respiratory distress.     Breath sounds: No stridor.  Abdominal:     General: There is distension.     Tenderness: There is no abdominal tenderness. There is no guarding or rebound.  Musculoskeletal:        General: Normal range of motion.     Cervical back: Normal range of motion.  Neurological:     Mental Status: She is alert and oriented to person, place, and time.  Psychiatric:        Behavior: Behavior normal.     ED Results and Treatments Labs (all labs ordered are listed, but only abnormal results are displayed) Labs Reviewed  LIPASE, BLOOD - Abnormal; Notable for the following components:      Result Value   Lipase 66 (*)    All other components within normal limits  COMPREHENSIVE METABOLIC PANEL - Abnormal; Notable for the following components:   Sodium 126 (*)    CO2 19 (*)    Glucose, Bld 111 (*)    Calcium 8.1 (*)    Total Protein 6.3 (*)    Albumin 2.4 (*)    AST 88 (*)    Alkaline Phosphatase 209 (*)    Total Bilirubin 2.8 (*)    All other components within normal limits  CBC - Abnormal; Notable for the following components:   RDW 17.9 (*)    Platelets 122 (*)    nRBC 0.5 (*)    All other components within normal limits  URINALYSIS, ROUTINE W REFLEX MICROSCOPIC - Abnormal; Notable for the following components:   Color, Urine AMBER (*)    APPearance HAZY (*)    Leukocytes,Ua TRACE (*)    All other components within normal limits                                                                                                                          EKG  EKG Interpretation  Date/Time:    Ventricular Rate:  PR Interval:    QRS Duration:   QT Interval:    QTC Calculation:   R Axis:     Text Interpretation:         Radiology IR Paracentesis  Result Date: 05/14/2022 INDICATION: Patient with history of hepatitis C, decompensated cirrhosis with recurrent ascites. Request to IR for diagnostic and therapeutic paracentesis with 8.0 L maximum. EXAM: ULTRASOUND GUIDED DIAGNOSTIC AND THERAPEUTIC PARACENTESIS MEDICATIONS: 6 mL 1% lidocaine COMPLICATIONS: None immediate. PROCEDURE: Informed written consent was obtained from the patient after a discussion of the risks, benefits and alternatives to treatment. A timeout was performed prior to the initiation of the procedure. Initial ultrasound scanning demonstrates a large amount of ascites within the left lower abdominal quadrant. The left lower abdomen was prepped and draped in the usual sterile fashion. 1% lidocaine was used for local anesthesia. Following this, a 19 gauge, 7-cm, Yueh catheter was introduced. An ultrasound image was saved for documentation purposes. The paracentesis was performed. The catheter was removed and a dressing was applied. The patient tolerated the procedure well without immediate post procedural complication. FINDINGS: A total of approximately 8.0 L of clear, light yellow fluid was removed. Samples were sent to the laboratory as requested by the clinical team. IMPRESSION: Successful ultrasound-guided paracentesis yielding 8.0 liters of peritoneal fluid. Performed by Lynnette Caffey, PA-C Electronically Signed   By: Irish Lack M.D.   On: 05/14/2022 10:50    Medications Ordered in ED Medications  metoCLOPramide (REGLAN) injection 10 mg (10 mg Intravenous Given 05/15/22 0219)  alum & mag hydroxide-simeth (MAALOX/MYLANTA) 200-200-20 MG/5ML suspension 30 mL (30 mLs Oral Given 05/15/22 0215)    And  lidocaine (XYLOCAINE)  2 % viscous mouth solution 15 mL (15 mLs Oral Given 05/15/22 0216)                                                                                                                                     Procedures Procedures  (including critical care time)  Medical Decision Making / ED Course  Click here for ABCD2, HEART and other calculators  Medical Decision Making Amount and/or Complexity of Data Reviewed Labs: ordered.  Risk OTC drugs. Prescription drug management.    This patient presents to the ED for concern of Abdominal cramping, this involves an extensive number of treatment options, and is a complaint that carries with it a high risk of complications and morbidity. The differential diagnosis includes but not limited to:  Functional intestinal cramping/bloating Renal stone Less suspicious for bacterial peritonitis following paracentesis given the intermittent nature of her symptoms and benign exam Doubt other serious intraabdominal inflammatory/infectious process   Initial intervention:  Reglan GI cocktail  Work up Interpretation and Management:  Laboratory Tests ordered listed below with my independent interpretation: CBC w/o leukocytosis or anemia CMP with baseline hyponatremia, and LFTs. Intact renal function.     Reassessment: Patient states that she feels significantly better  Final Clinical Impression(s) / ED Diagnoses Final diagnoses:  Abdominal cramping   The patient appears reasonably screened and/or stabilized for discharge and I doubt any other medical condition or other [facility] requiring further screening, evaluation, or treatment in the ED at this time. I have discussed the findings, Dx and Tx plan with the patient/family who expressed understanding and agree(s) with the plan. Discharge instructions discussed at length. The patient/family was given strict return precautions who verbalized understanding of the instructions. No further questions at  time of discharge.  Disposition: Discharge  Condition: Good  ED Discharge Orders          Ordered    ondansetron (ZOFRAN-ODT) 4 MG disintegrating tablet  Every 8 hours PRN        05/15/22 0410             Follow Up: Primary care provider  Call  to schedule an appointment for close follow up  Pyrtle, Carie Caddy, MD [address] [place] [address] [phone]  Call  as needed           This chart was dictated using voice recognition software.  Despite best efforts to proofread,  errors can occur which can change the documentation meaning.    Nira Conn, MD 05/15/22 [phone]

## 2022-05-16 LAB — PATHOLOGIST SMEAR REVIEW

## 2022-05-18 NOTE — Progress Notes (Deleted)
05/18/2022 Kathy Little [id number] 1960-02-01   Chief Complaint:  History of Present Illness: Kathy Little is a 62 year old female with a past medical history of recurrent cervical cancer s/p subtotal hysterectomy 2012 treated with brachytherapy 2016, chronic leukopenia, thrombocytopenia, chronic hepatitis C gentoype 1a with decompensated cirrhosis with ascites, SBP, bleeding esophageal varices s/p banding 03/20/2022 and hepatic encephalopathy.   She has not been able to tolerate diuretic therapy due to hyponatremia   CT angio/BRTO protocol in March 2024 confirming esophageal varices with no gastric varices, no splenic gastric renal shunt.   She was seen by Annamarie Major NP at [facility] 04/14/2022. MELD 3.0 = 25 with T. bili 5.5, NA 122, CR 0.81, ALB 3.1, and INR 1.6. Peth negative.   given her decompensated cirrhosis and poor liver synthetic function that I would defer treating her hepatitis C at this time.  If we were to treat her hepatitis C she would require either 24 weeks of treatment with Epclusa for 12 weeks of Epclusa with ribavirin. Given her recent GI bleed and ongoing mild anemia I would be hesitant to treat her with ribavirin. Given her high MELD there is potential that if she is a candidate for transplant that she would receive an organ offer before she would be able to finish 24 weeks of treatment. Can reconsider treating her hepatitis C pending transplant evaluation as treatment could be deferred to post transplant.   Patient is not a liver transplant candidate due to: inadequate support and transportation for liver transplant eval and appointments in [place].   I contacted her local GI, Dr. Rhea Belton to make aware that OLT is not an option due to lack of support and that if patient considers TIPS she would need to be aware that she is at higher risk for progressive decompensation without potential for OLT.      Latest Ref Rng &  Units 05/14/2022   11:54 PM 04/08/2022    9:36 AM 04/03/2022   10:04 AM  CBC  WBC 4.0 - 10.5 K/uL 4.1  4.8  3.5   Hemoglobin 12.0 - 15.0 g/dL 40.9  81.1  91.4   Hematocrit 36.0 - 46.0 % 36.1  35.8  33.1   Platelets 150 - 400 K/uL 122  138.0  111        Latest Ref Rng & Units 05/14/2022   11:54 PM 04/22/2022   10:35 AM 04/11/2022    7:46 AM  CMP  Glucose 70 - 99 mg/dL 782  956  213   BUN 8 - 23 mg/dL 13  12  15    Creatinine 0.44 - 1.00 mg/dL 0.86  5.78  4.69   Sodium 135 - 145 mmol/L 126  125  122   Potassium 3.5 - 5.1 mmol/L 4.8  3.6  4.1   Chloride 98 - 111 mmol/L 100  95  96   CO2 22 - 32 mmol/L 19  17  23    Calcium 8.9 - 10.3 mg/dL 8.1  8.0  8.2   Total Protein 6.5 - 8.1 g/dL 6.3  6.7    Total Bilirubin 0.3 - 1.2 mg/dL 2.8  2.9    Alkaline Phos 38 - 126 U/L 209  242    AST 15 - 41 U/L 88  109    ALT 0 - 44 U/L 43  51      EGD 04/23/2022: - Large (> 5 mm) esophageal varices with no bleeding and no  stigmata of recent bleeding. Completely eradicated. Banded x 5.  - Portal hypertensive gastropathy.  - Normal examined duodenum. - No specimens collected. - Continue present medications. Diuretics remain on hold due to low Na. Continue pantoprazole 40 mg twice daily and rifaximin 550 mg twice daily. - Add nadolol 20 mg once daily. - Repeat LVP with IV albumin next week (up to 8 L removed). - Repeat upper endoscopy in 4 weeks for retreatment in outpatient hospital setting.  EGD 03/20/2022 by Dr. Meridee Score as an inpatient due to UGI bleed/hematemesis: - No gross lesions in the proximal esophagus.  - Grade III esophageal varices oozing blood. Completely eradicated. Banded.  - Z-line irregular, 35 cm from the incisors.  - 2 cm hiatal hernia.  - Clotted blood in the cardia, in the gastric fundus and in the gastric body.  - Portal hypertensive gastropathy.  - No gross lesions in the duodenal bulb, in the first portion of the duodenum and in the second portion of the duodenum. - Continue PPI  drip for 72 hours from admission. - Continue octreotide for 72 hours from admission. - Carafate liquid slurry twice daily. - Ceftriaxone for 5-day course if continues to be inpatient otherwise can transition to cefpodoxime or ciprofloxacin after first 72 hours. - Obtain TTE as previously discussed for evaluation of heart function. - After discussion with Dr. Rhea Belton, the patient's primary gastroenterologist, he is considering early TIPS for this individual who has diuretic resistant ascites and now has evidence of significant variceal bleeding although we have controlled it with EVL today. We would ask to have interventional radiology evaluate the patient for candidacy for potential TIPS as an inpatient or early outpatient. - If patient is a noncandidate for some reason for TIPS, then she will need repeat EGD in 3 to 5 weeks for further EVL protocol.  EGD 03/17/2022 by Dr. Rhea Belton as an outpatient to survey for esophageal/gastric varices: - Grade III and large (> 5 mm) esophageal varices.  - Portal hypertensive gastropathy.  - No gastric varices seen.  - Normal examined duodenum.  - No specimens collected. - Given size of varices consideration of EVL versus early TIPS is recommended. TIPS may be preferred given recurrent ascites as this would manage both problems. - Schedule ECHO for pre-TIPS evaluation. - IR referral to consider TIPS. If IR does not feel she is a TIPS candidate repeat EGD in the outpatient hospital setting is recommended for variceal band ligation. - She will also be seen [facility] next month. - In the interim repeat LVP can be done PRN with IV albumin.  ECHO 03/20/2022: IMPRESSIONS Left ventricular ejection fraction, by estimation, is 45 to 50%. The left ventricle has mildly decreased function. The left ventricle demonstrates global hypokinesis. There is mild concentric left ventricular hypertrophy. Left ventricular diastolic parameters are indeterminate.  2. Right ventricular  systolic function is normal. The right ventricular size is normal. The mitral valve is normal in structure. Trivial mitral valve regurgitation. No evidence of mitral stenosis.  3. The aortic valve is tricuspid. Aortic valve regurgitation is trivial. Aortic valve sclerosis/calcification is present, without any evidence of aortic stenosis.  4. Aortic dilatation noted. There is mild dilatation of the ascending aorta, measuring 39mm.  Current Medications, Allergies, Past Medical History, Past Surgical History, Family History and Social History were reviewed in [employer] record.   Review of Systems:   Constitutional: Negative for fever, sweats, chills or weight loss.  Respiratory: Negative for shortness of breath.  Cardiovascular: Negative for chest pain, palpitations and leg swelling.  Gastrointestinal: See HPI.  Musculoskeletal: Negative for back pain or muscle aches.  Neurological: Negative for dizziness, headaches or paresthesias.    Physical Exam: There were no vitals taken for this visit. General: in no acute distress. Head: Normocephalic and atraumatic. Eyes: No scleral icterus. Conjunctiva pink . Ears: Normal auditory acuity. Mouth: Dentition intact. No ulcers or lesions.  Lungs: Clear throughout to auscultation. Heart: Regular rate and rhythm, no murmur. Abdomen: Soft, nontender and nondistended. No masses or hepatomegaly. Normal bowel sounds x 4 quadrants.  Rectal: Deferred.  Musculoskeletal: Symmetrical with no gross deformities. Extremities: No edema. Neurological: Alert oriented x 4. No focal deficits.  Psychological: Alert and cooperative. Normal mood and affect  Assessment and Recommendations:  62 year old female with chronic hepatitis C with decompensated cirrhosis with ascites and esophageal varices with UGI bleed s/p variceal banding.  Seen by [facility], she is not a liver transplant candidate due to inadequate support and  transportation for liver transplant evaluation and appointments in [place].

## 2022-05-19 ENCOUNTER — Ambulatory Visit: Payer: Medicaid Other | Admitting: Nurse Practitioner

## 2022-05-19 ENCOUNTER — Other Ambulatory Visit: Payer: Self-pay

## 2022-05-19 ENCOUNTER — Encounter: Payer: Self-pay | Admitting: Internal Medicine

## 2022-05-19 ENCOUNTER — Other Ambulatory Visit (HOSPITAL_COMMUNITY): Payer: Self-pay

## 2022-05-19 ENCOUNTER — Other Ambulatory Visit (HOSPITAL_COMMUNITY): Payer: Self-pay | Admitting: Emergency Medicine

## 2022-05-20 ENCOUNTER — Other Ambulatory Visit: Payer: Self-pay

## 2022-05-20 ENCOUNTER — Ambulatory Visit: Payer: No Typology Code available for payment source

## 2022-05-20 ENCOUNTER — Other Ambulatory Visit (HOSPITAL_COMMUNITY): Payer: Self-pay

## 2022-05-21 ENCOUNTER — Other Ambulatory Visit: Payer: Self-pay

## 2022-05-21 ENCOUNTER — Encounter: Payer: Self-pay | Admitting: Internal Medicine

## 2022-05-21 ENCOUNTER — Telehealth: Payer: Self-pay | Admitting: Internal Medicine

## 2022-05-21 ENCOUNTER — Other Ambulatory Visit (HOSPITAL_COMMUNITY): Payer: Self-pay

## 2022-05-21 DIAGNOSIS — K7460 Unspecified cirrhosis of liver: Secondary | ICD-10-CM

## 2022-05-21 NOTE — Telephone Encounter (Signed)
Patient called regarding previous Mychart message. Requesting order to be sent in right away because she is feeling extremely unwell. Please advise, thank you.

## 2022-05-21 NOTE — Telephone Encounter (Signed)
Error

## 2022-05-21 NOTE — Telephone Encounter (Signed)
See my chart message

## 2022-05-21 NOTE — Telephone Encounter (Signed)
Patient called to follow up stated she is now feeling like the fluid is in her ribs and her back is hurting.

## 2022-05-22 ENCOUNTER — Ambulatory Visit (HOSPITAL_COMMUNITY)
Admission: RE | Admit: 2022-05-22 | Discharge: 2022-05-22 | Disposition: A | Discharge status: Home / Self Care | Payer: No Typology Code available for payment source | Source: Ambulatory Visit | Attending: Gastroenterology | Admitting: Gastroenterology

## 2022-05-22 DIAGNOSIS — R188 Other ascites: Secondary | ICD-10-CM | POA: Insufficient documentation

## 2022-05-22 DIAGNOSIS — K7460 Unspecified cirrhosis of liver: Secondary | ICD-10-CM | POA: Insufficient documentation

## 2022-05-22 HISTORY — PX: IR PARACENTESIS: IMG2679

## 2022-05-22 LAB — BODY FLUID CELL COUNT WITH DIFFERENTIAL
Eos, Fluid: 0 %
Lymphs, Fluid: 29 %
Monocyte-Macrophage-Serous Fluid: 66 % (ref 50–90)
Neutrophil Count, Fluid: 5 % (ref 0–25)
Total Nucleated Cell Count, Fluid: 23 cu mm (ref 0–1000)

## 2022-05-22 MED ORDER — ALBUMIN HUMAN 25 % IV SOLN
INTRAVENOUS | Status: AC
  Filled 2022-05-22: qty 200

## 2022-05-22 MED ORDER — LIDOCAINE HCL 1 % IJ SOLN
INTRAMUSCULAR | Status: AC
  Filled 2022-05-22: qty 20

## 2022-05-22 MED ORDER — ALBUMIN HUMAN 25 % IV SOLN
50.0000 g | Freq: Once | INTRAVENOUS | Status: AC
  Administered 2022-05-22: 50 g via INTRAVENOUS

## 2022-05-22 NOTE — Procedures (Signed)
PROCEDURE SUMMARY:  Successful US guided paracentesis from left lateral abdomen.  Yielded 8 liters of clear yellow fluid.  No immediate complications.  Patient tolerated well.  EBL = trace  Patient is being worked up for liver transplant  Gwynneth Macleod PA-C 05/22/2022 3:07 PM

## 2022-05-23 ENCOUNTER — Other Ambulatory Visit (HOSPITAL_COMMUNITY): Payer: Self-pay

## 2022-05-23 ENCOUNTER — Encounter: Payer: Self-pay | Admitting: Student

## 2022-05-23 LAB — PATHOLOGIST SMEAR REVIEW: Path Review: NEGATIVE

## 2022-05-26 ENCOUNTER — Inpatient Hospital Stay (HOSPITAL_COMMUNITY)
Admission: EM | Admit: 2022-05-26 | Discharge: 2022-05-27 | DRG: 433 | Disposition: A | Discharge status: Home / Self Care | Payer: No Typology Code available for payment source | Attending: Internal Medicine | Admitting: Internal Medicine

## 2022-05-26 ENCOUNTER — Other Ambulatory Visit: Payer: Self-pay

## 2022-05-26 ENCOUNTER — Telehealth: Payer: Self-pay | Admitting: Internal Medicine

## 2022-05-26 ENCOUNTER — Encounter (HOSPITAL_COMMUNITY): Payer: Self-pay

## 2022-05-26 ENCOUNTER — Emergency Department (HOSPITAL_COMMUNITY): Payer: No Typology Code available for payment source

## 2022-05-26 ENCOUNTER — Encounter: Payer: Medicaid Other | Admitting: Student

## 2022-05-26 DIAGNOSIS — K7682 Hepatic encephalopathy: Secondary | ICD-10-CM | POA: Diagnosis present

## 2022-05-26 DIAGNOSIS — Z880 Allergy status to penicillin: Secondary | ICD-10-CM

## 2022-05-26 DIAGNOSIS — Z87891 Personal history of nicotine dependence: Secondary | ICD-10-CM

## 2022-05-26 DIAGNOSIS — Z8544 Personal history of malignant neoplasm of other female genital organs: Secondary | ICD-10-CM | POA: Diagnosis not present

## 2022-05-26 DIAGNOSIS — B1920 Unspecified viral hepatitis C without hepatic coma: Secondary | ICD-10-CM | POA: Diagnosis present

## 2022-05-26 DIAGNOSIS — R188 Other ascites: Secondary | ICD-10-CM | POA: Diagnosis present

## 2022-05-26 DIAGNOSIS — Z79890 Hormone replacement therapy (postmenopausal): Secondary | ICD-10-CM

## 2022-05-26 DIAGNOSIS — K7460 Unspecified cirrhosis of liver: Secondary | ICD-10-CM | POA: Diagnosis present

## 2022-05-26 DIAGNOSIS — K7290 Hepatic failure, unspecified without coma: Secondary | ICD-10-CM | POA: Diagnosis not present

## 2022-05-26 DIAGNOSIS — E871 Hypo-osmolality and hyponatremia: Secondary | ICD-10-CM | POA: Diagnosis present

## 2022-05-26 DIAGNOSIS — Z79899 Other long term (current) drug therapy: Secondary | ICD-10-CM

## 2022-05-26 DIAGNOSIS — Z90710 Acquired absence of both cervix and uterus: Secondary | ICD-10-CM | POA: Diagnosis not present

## 2022-05-26 DIAGNOSIS — R4182 Altered mental status, unspecified: Secondary | ICD-10-CM

## 2022-05-26 DIAGNOSIS — E875 Hyperkalemia: Secondary | ICD-10-CM | POA: Diagnosis present

## 2022-05-26 DIAGNOSIS — Z888 Allergy status to other drugs, medicaments and biological substances status: Secondary | ICD-10-CM

## 2022-05-26 LAB — AMMONIA: Ammonia: 48 umol/L — ABNORMAL HIGH (ref 9–35)

## 2022-05-26 LAB — HEPATIC FUNCTION PANEL
ALT: 39 U/L (ref 0–44)
AST: 73 U/L — ABNORMAL HIGH (ref 15–41)
Albumin: 2.4 g/dL — ABNORMAL LOW (ref 3.5–5.0)
Alkaline Phosphatase: 225 U/L — ABNORMAL HIGH (ref 38–126)
Bilirubin, Direct: 1 mg/dL — ABNORMAL HIGH (ref 0.0–0.2)
Indirect Bilirubin: 1.7 mg/dL — ABNORMAL HIGH (ref 0.3–0.9)
Total Bilirubin: 2.7 mg/dL — ABNORMAL HIGH (ref 0.3–1.2)
Total Protein: 6.1 g/dL — ABNORMAL LOW (ref 6.5–8.1)

## 2022-05-26 LAB — CBC
HCT: 37 % (ref 36.0–46.0)
Hemoglobin: 12.1 g/dL (ref 12.0–15.0)
MCH: 30.8 pg (ref 26.0–34.0)
MCHC: 32.7 g/dL (ref 30.0–36.0)
MCV: 94.1 fL (ref 80.0–100.0)
Platelets: 103 10*3/uL — ABNORMAL LOW (ref 150–400)
RBC: 3.93 MIL/uL (ref 3.87–5.11)
RDW: 20.2 % — ABNORMAL HIGH (ref 11.5–15.5)
WBC: 5 10*3/uL (ref 4.0–10.5)
nRBC: 0 % (ref 0.0–0.2)

## 2022-05-26 LAB — GRAM STAIN

## 2022-05-26 LAB — BASIC METABOLIC PANEL
Anion gap: 5 (ref 5–15)
BUN: 12 mg/dL (ref 8–23)
CO2: 24 mmol/L (ref 22–32)
Calcium: 8 mg/dL — ABNORMAL LOW (ref 8.9–10.3)
Chloride: 98 mmol/L (ref 98–111)
Creatinine, Ser: 0.68 mg/dL (ref 0.44–1.00)
GFR, Estimated: >60 mL/min (ref >60)
Glucose, Bld: 89 mg/dL (ref 70–99)
Potassium: 5.2 mmol/L — ABNORMAL HIGH (ref 3.5–5.1)
Sodium: 127 mmol/L — ABNORMAL LOW (ref 135–145)

## 2022-05-26 LAB — BODY FLUID CELL COUNT WITH DIFFERENTIAL
Eos, Fluid: 0 %
Lymphs, Fluid: 57 %
Monocyte-Macrophage-Serous Fluid: 38 % — ABNORMAL LOW (ref 50–90)
Neutrophil Count, Fluid: 5 % (ref 0–25)
Total Nucleated Cell Count, Fluid: 24 cu mm (ref 0–1000)

## 2022-05-26 LAB — URINALYSIS, ROUTINE W REFLEX MICROSCOPIC
Bilirubin Urine: NEGATIVE
Glucose, UA: NEGATIVE mg/dL
Hgb urine dipstick: NEGATIVE
Ketones, ur: NEGATIVE mg/dL
Leukocytes,Ua: NEGATIVE
Nitrite: NEGATIVE
Protein, ur: NEGATIVE mg/dL
Specific Gravity, Urine: 1.028 (ref 1.005–1.030)
pH: 5 (ref 5.0–8.0)

## 2022-05-26 LAB — GLUCOSE, PLEURAL OR PERITONEAL FLUID: Glucose, Fluid: 128 mg/dL

## 2022-05-26 LAB — CBG MONITORING, ED: Glucose-Capillary: 148 mg/dL — ABNORMAL HIGH (ref 70–99)

## 2022-05-26 LAB — PROTIME-INR
INR: 1.6 — ABNORMAL HIGH (ref 0.8–1.2)
Prothrombin Time: 19.4 seconds — ABNORMAL HIGH (ref 11.4–15.2)

## 2022-05-26 LAB — TROPONIN I (HIGH SENSITIVITY)
Troponin I (High Sensitivity): 17 ng/L (ref <18)
Troponin I (High Sensitivity): 15 ng/L (ref <18)

## 2022-05-26 LAB — MAGNESIUM: Magnesium: 1.8 mg/dL (ref 1.7–2.4)

## 2022-05-26 MED ORDER — SODIUM ZIRCONIUM CYCLOSILICATE 10 G PO PACK
10.0000 g | PACK | Freq: Once | ORAL | Status: AC
  Administered 2022-05-26: 10 g via ORAL
  Filled 2022-05-26: qty 1

## 2022-05-26 MED ORDER — ACETAMINOPHEN 650 MG RE SUPP: 650.0000 mg | Freq: Four times a day (QID) | RECTAL | Status: DC | PRN

## 2022-05-26 MED ORDER — PANTOPRAZOLE SODIUM 40 MG PO TBEC
40.0000 mg | DELAYED_RELEASE_TABLET | Freq: Every day | ORAL | Status: DC
  Administered 2022-05-26 – 2022-05-27 (×2): 40 mg via ORAL
  Filled 2022-05-26 (×2): qty 1

## 2022-05-26 MED ORDER — NADOLOL 20 MG PO TABS
20.0000 mg | ORAL_TABLET | Freq: Every day | ORAL | Status: DC
  Administered 2022-05-26 – 2022-05-27 (×2): 20 mg via ORAL
  Filled 2022-05-26 (×2): qty 1

## 2022-05-26 MED ORDER — SUCRALFATE 1 G PO TABS
1.0000 g | ORAL_TABLET | Freq: Three times a day (TID) | ORAL | Status: DC
  Administered 2022-05-26 – 2022-05-27 (×2): 1 g via ORAL
  Filled 2022-05-26 (×2): qty 1

## 2022-05-26 MED ORDER — SULFAMETHOXAZOLE-TRIMETHOPRIM 800-160 MG PO TABS
1.0000 | ORAL_TABLET | Freq: Two times a day (BID) | ORAL | Status: DC
  Administered 2022-05-26 – 2022-05-27 (×2): 1 via ORAL
  Filled 2022-05-26 (×3): qty 1

## 2022-05-26 MED ORDER — LIDOCAINE-EPINEPHRINE (PF) 2 %-1:200000 IJ SOLN
10.0000 mL | Freq: Once | INTRAMUSCULAR | Status: AC
  Administered 2022-05-26: 10 mL via INTRADERMAL
  Filled 2022-05-26: qty 20

## 2022-05-26 MED ORDER — ACETAMINOPHEN 325 MG PO TABS: 650.0000 mg | ORAL_TABLET | Freq: Four times a day (QID) | ORAL | Status: DC | PRN

## 2022-05-26 MED ORDER — RIFAXIMIN 550 MG PO TABS
550.0000 mg | ORAL_TABLET | Freq: Two times a day (BID) | ORAL | Status: DC
  Administered 2022-05-26 – 2022-05-27 (×2): 550 mg via ORAL
  Filled 2022-05-26 (×3): qty 1

## 2022-05-26 MED ORDER — LACTULOSE 10 GM/15ML PO SOLN
30.0000 g | Freq: Once | ORAL | Status: AC
  Administered 2022-05-26: 30 g via ORAL
  Filled 2022-05-26: qty 45

## 2022-05-26 NOTE — ED Provider Notes (Signed)
62 yo female presenting with AMS, confusion Hx of liver disease, paracentesis Normally lucid, abruptly confused past few days CTH normal, chronic hyponatremia, Ammonia 48, UA unremarkable, pending SBP labs  Physical Exam  BP 101/84 (BP Location: Right Arm)   Pulse 74   Temp 97.7 F (36.5 C) (Oral)   Resp 19   Ht 5\' 7"  (1.702 m)   Wt 63.5 kg   SpO2 100%   BMI 21.93 kg/m   Physical Exam  Procedures  Procedures  ED Course / MDM   Clinical Course as of 05/26/22 1828  Mon May 26, 2022  1824 Admitted to IM service [MT]    Clinical Course User Index [MT] Terald Sleeper, MD   Medical Decision Making Amount and/or Complexity of Data Reviewed Labs: ordered. Radiology: ordered.  Risk Prescription drug management. Decision regarding hospitalization.         Terald Sleeper, MD 05/26/22 [phone]

## 2022-05-26 NOTE — ED Provider Notes (Signed)
[place] EMERGENCY DEPARTMENT AT [facility] Provider Note   CSN: [id number] Arrival date & time: 05/26/22  1110     History  Chief Complaint  Patient presents with   Emesis   Altered Mental Status    Kathy Little is a 62 y.o. female.   Emesis Associated symptoms: abdominal pain   Altered Mental Status Presenting symptoms: confusion   Associated symptoms: abdominal pain, nausea and vomiting   Patient presents for confusion, dizziness, nausea, vomiting.  Medical history includes cirrhosis, CAD.  Prescribed medications include rifaximin.  Patient underwent therapeutic paracentesis 4 days ago.  She was recently treated for UTI.  Patient reports that she has had recurrence of abdominal distention since her recent paracentesis.  She endorses generalized abdominal pain.  She denies any other areas of discomfort.     Home Medications Prior to Admission medications   Medication Sig Start Date End Date Taking? Authorizing Provider  acetaminophen (TYLENOL) 500 MG tablet Take 1,000 mg by mouth daily as needed for moderate pain, fever or headache.    [provider]  conjugated estrogens (PREMARIN) vaginal cream Place small amount on your finger (don't use applicator) and insert into the vagina 2-3 times a week at night 05/02/22   Carver Fila, MD  nadolol (CORGARD) 20 MG tablet Take 1 tablet (20 mg total) by mouth daily. 04/23/22 04/23/23  Pyrtle, Carie Caddy, MD  ondansetron (ZOFRAN-ODT) 4 MG disintegrating tablet Dissolve 1 tablet (4 mg total) in mouth every 8 (eight) hours as needed for up to 3 days for nausea or vomiting. 05/15/22 05/30/22  Nira Conn, MD  pantoprazole (PROTONIX) 40 MG tablet Take 1 tablet (40 mg total) by mouth 2 (two) times daily. 04/22/22 05/24/22  Adron Bene, MD  potassium chloride SA (KLOR-CON M) 20 MEQ tablet Take 1 tablet (20 mEq total) by mouth daily. 04/22/22 05/24/22  Adron Bene, MD  rifaximin (XIFAXAN) 550 MG TABS  tablet Take 1 tablet (550 mg total) by mouth 2 (two) times daily. 04/22/22 08/20/22  Adron Bene, MD  sucralfate (CARAFATE) 1 GM/10ML suspension Take 10 mLs (1 g total) by mouth 2 (two) times daily. 04/22/22 05/24/22  Adron Bene, MD  sulfamethoxazole-trimethoprim (BACTRIM DS) 800-160 MG tablet Take 1 tablet by mouth daily. 04/22/22 08/20/22  Adron Bene, MD      Allergies    Ampicillin and Flagyl [metronidazole]    Review of Systems   Review of Systems  Unable to perform ROS: Mental status change  Gastrointestinal:  Positive for abdominal distention, abdominal pain, nausea and vomiting.  Psychiatric/Behavioral:  Positive for confusion.     Physical Exam Updated Vital Signs BP 101/84 (BP Location: Right Arm)   Pulse 74   Temp 97.7 F (36.5 C) (Oral)   Resp 19   Ht 5\' 7"  (1.702 m)   Wt 63.5 kg   SpO2 100%   BMI 21.93 kg/m  Physical Exam Vitals and nursing note reviewed.  Constitutional:      General: She is not in acute distress.    Appearance: Normal appearance. She is well-developed. She is not ill-appearing, toxic-appearing or diaphoretic.  HENT:     Head: Normocephalic and atraumatic.     Right Ear: External ear normal.     Left Ear: External ear normal.     Nose: Nose normal.     Mouth/Throat:     Mouth: Mucous membranes are moist.  Eyes:     General: No scleral icterus.    Extraocular Movements: Extraocular  movements intact.     Conjunctiva/sclera: Conjunctivae normal.  Cardiovascular:     Rate and Rhythm: Normal rate and regular rhythm.     Heart sounds: No murmur heard. Pulmonary:     Effort: Pulmonary effort is normal. No respiratory distress.     Breath sounds: Normal breath sounds. No wheezing or rales.  Abdominal:     General: There is distension.     Palpations: Abdomen is soft.     Tenderness: There is abdominal tenderness. There is no guarding or rebound.  Musculoskeletal:        General: No swelling. Normal range of motion.      Cervical back: Normal range of motion and neck supple.     Right lower leg: No edema.     Left lower leg: No edema.  Skin:    General: Skin is warm and dry.     Coloration: Skin is not jaundiced or pale.  Neurological:     General: No focal deficit present.     Mental Status: She is alert. She is disoriented.     Cranial Nerves: No cranial nerve deficit.     Sensory: No sensory deficit.     Motor: No weakness.     Coordination: Coordination normal.  Psychiatric:        Mood and Affect: Mood normal.        Behavior: Behavior normal.     ED Results / Procedures / Treatments   Labs (all labs ordered are listed, but only abnormal results are displayed) Labs Reviewed  URINALYSIS, ROUTINE W REFLEX MICROSCOPIC - Abnormal; Notable for the following components:      Result Value   Color, Urine AMBER (*)    APPearance HAZY (*)    All other components within normal limits  CBC - Abnormal; Notable for the following components:   RDW 20.2 (*)    Platelets 103 (*)    All other components within normal limits  BASIC METABOLIC PANEL - Abnormal; Notable for the following components:   Sodium 127 (*)    Potassium 5.2 (*)    Calcium 8.0 (*)    All other components within normal limits  AMMONIA - Abnormal; Notable for the following components:   Ammonia 48 (*)    All other components within normal limits  HEPATIC FUNCTION PANEL - Abnormal; Notable for the following components:   Total Protein 6.1 (*)    Albumin 2.4 (*)    AST 73 (*)    Alkaline Phosphatase 225 (*)    Total Bilirubin 2.7 (*)    Bilirubin, Direct 1.0 (*)    Indirect Bilirubin 1.7 (*)    All other components within normal limits  BODY FLUID CELL COUNT WITH DIFFERENTIAL - Abnormal; Notable for the following components:   Monocyte-Macrophage-Serous Fluid 38 (*)    All other components within normal limits  CBG MONITORING, ED - Abnormal; Notable for the following components:   Glucose-Capillary 148 (*)    All other  components within normal limits  GRAM STAIN  CULTURE, BODY FLUID W GRAM STAIN -BOTTLE  MAGNESIUM  GLUCOSE, PLEURAL OR PERITONEAL FLUID  TROPONIN I (HIGH SENSITIVITY)  TROPONIN I (HIGH SENSITIVITY)    EKG None  Radiology CT Head Wo Contrast  Result Date: 05/26/2022 CLINICAL DATA:  Mental status change, unknown cause EXAM: CT HEAD WITHOUT CONTRAST TECHNIQUE: Contiguous axial images were obtained from the base of the skull through the vertex without intravenous contrast. RADIATION DOSE REDUCTION: This exam was performed according  to the departmental dose-optimization program which includes automated exposure control, adjustment of the mA and/or kV according to patient size and/or use of iterative reconstruction technique. COMPARISON:  None Available. FINDINGS: Brain: No evidence of acute infarction, hemorrhage, hydrocephalus, extra-axial collection or mass lesion/mass effect. Vascular: No hyperdense vessel or unexpected calcification. Skull: Normal. Negative for fracture or focal lesion. Sinuses/Orbits: No middle ear or mastoid effusion. Paranasal sinuses are clear. Orbits are unremarkable. Other: None. IMPRESSION: No acute intracranial abnormality. Electronically Signed   By: Lorenza Cambridge M.D.   On: 05/26/2022 13:55    Procedures .Paracentesis  Date/Time: 05/26/2022 3:56 PM  Performed by: Gloris Manchester, MD Authorized by: Gloris Manchester, MD   Consent:    Consent obtained:  Verbal   Consent given by:  Patient   Risks, benefits, and alternatives were discussed: yes     Risks discussed:  Bleeding, bowel perforation, infection and pain   Alternatives discussed:  No treatment and delayed treatment Universal protocol:    Procedure explained and questions answered to patient or proxy's satisfaction: yes     Patient identity confirmed:  Verbally with patient and arm band Pre-procedure details:    Procedure purpose:  Diagnostic   Preparation: Patient was prepped and draped in usual sterile fashion    Anesthesia:    Anesthesia method:  Local infiltration   Local anesthetic:  Lidocaine 2% WITH epi Procedure details:    Needle gauge:  18   Ultrasound guidance: yes     Puncture site:  R lower quadrant   Fluid removed amount:  20cc   Fluid appearance:  Yellow   Dressing:  Adhesive bandage Post-procedure details:    Procedure completion:  Tolerated well, no immediate complications     Medications Ordered in ED Medications  lidocaine-EPINEPHrine (XYLOCAINE W/EPI) 2 %-1:200000 (PF) injection 10 mL (10 mLs Intradermal Given 05/26/22 1559)  lactulose (CHRONULAC) 10 GM/15ML solution 30 g (30 g Oral Given 05/26/22 1432)    ED Course/ Medical Decision Making/ A&P                             Medical Decision Making Amount and/or Complexity of Data Reviewed Labs: ordered. Radiology: ordered.  Risk Prescription drug management.   This patient presents to the ED for concern of altered mental status, this involves an extensive number of treatment options, and is a complaint that carries with it a high risk of complications and morbidity.  The differential diagnosis includes hepatic encephalopathy, CVA, ICH, SBP, azotemia, other metabolic derangements, polypharmacy, intoxication   Co morbidities that complicate the patient evaluation  cirrhosis, CAD   Additional history obtained:  Additional history obtained from patient's niece External records from outside source obtained and reviewed including EMR   Lab Tests:  I Ordered, and personally interpreted labs.  The pertinent results include: Normal hemoglobin, no leukocytosis, mild hypokalemia and hypocalcemia, baseline hyponatremia, ammonia is elevated consistent with hepatic encephalopathy.   Imaging Studies ordered:  I ordered imaging studies including CT head I independently visualized and interpreted imaging which showed no acute findings I agree with the radiologist interpretation   Cardiac Monitoring: / EKG:  The  patient was maintained on a cardiac monitor.  I personally viewed and interpreted the cardiac monitored which showed an underlying rhythm of: Sinus rhythm   Problem List / ED Course / Critical interventions / Medication management  Patient presents for initial family report of confusion in addition to nausea and vomiting today.  Patient, herself, endorses generalized abdominal pain.  Per chart review, she underwent repeat paracentesis 4 days ago.  Abdomen today is distended.  She does endorse tenderness, however, there is no rebound or guarding.  She does appear to have mild asterixis on exam.  Laboratory workup was initiated.  Given her known liver disease and bleeding diathesis, patient undergo CT of head.  Given her abdominal pain and tenderness, this does raise concern for SBP.  Lab results are notable for elevated ammonia.  Although this elevation is mild, it is consistent with prior hospitalization for hepatic encephalopathy.  Dose of lactulose was ordered.  Patient underwent paracentesis.  Results were pending at time of signout.  Care patient signed out to oncoming ED provider. I ordered medication including lactulose for hepatic encephalopathy Reevaluation of the patient after these medicines showed that the patient stayed the same I have reviewed the patients home medicines and have made adjustments as needed   Social Determinants of Health:  Has access to outpatient care         Final Clinical Impression(s) / ED Diagnoses Final diagnoses:  Hepatic encephalopathy [facility])    Rx / DC Orders ED Discharge Orders     None         Gloris Manchester, MD 05/26/22 [phone]

## 2022-05-26 NOTE — H&P (Signed)
Date: 05/27/2022               Patient Name:  Kathy Little MRN: [id number]  DOB: Dec 10, 1960 Age / Sex: 62 y.o., female   PCP: Adron Bene, MD (Inactive)         Medical Service: Internal Medicine Teaching Service         Attending Physician: Dr. Reymundo Poll, MD    First Contact: Karie Fetch, MD Pager: [phone]  Second Contact: Sharrell Ku, MD Pager: PA [phone]       After Hours (After 5p/  First Contact Pager: [phone]  weekends / holidays): Second Contact Pager: [phone]   SUBJECTIVE   Chief Complaint: altered mental status  History of Present Illness: Kathy Little is a 63 y.o. F with PMH incompletely treated HCV, hepatic cirrhosis, and recurrent vaginal cancer who presented to [facility] ED with AMS.   Patient states that for the last 1 week she has been experiencing intermittent large-volume emesis. She denies fever or chills. No recent illness or sick contacts that she can think of. She has not noted any frank blood or coffee-ground appearance of the contents. She has had abdominal pain as well, and abdominal distention which is not changed from her baseline accumulation between paracentesis sessions. She typically has these done weekly at the [facility], and they are often large-volume. She has been eating and drinking well. Her last BM was today and she has been having bowel movements daily.  Of note the patient does not feel that she is more confused than baseline, but notes that her family raised concern that something was wrong which is why she is now here. She has been hospitalized before with hepatic encephalopathy and at that time she had hematemesis. Reached family by phone to discuss history and medications but they stated they (father and son) were unable to provide any further information regarding her history. They stated they were not at home and could not provide med history but to call back tomorrow.  She does not know how she was infected with  hepatitis C and says that when she was a child, she and several friends contracted the illness.   Of note, chart review reveals some concern for possible overlap with autoimmune hepatitis, though HCV is suspected to be main driver of her disease. She has been referred to [facility] for consideration of liver transplant and this process has been initiated. Most recent EGD 04/23/2022 with large esophageal varices without bleeding or stigmata of recent bleeding, completely eradicated, and 5 bands placed; also noted was portal hypertensive gastropathy. Currently not recommended for TIPS procedure unless she develops additional life-threatening GI bleed.  Most recent liver imaging includes: -MRI liver 02/17/2022: morphologic features of the liver compatible with cirrhosis, no suspicious enhancing liver lesions identified; stigmata of portal venous hypertension including splenomegaly, esophageal, gastric and splenic varices and large volume of ascites; mild diffuse wall thickening of the small bowel loops. -Ultrasound liver 03/21/2022: cirrhotic morphology without focal lesion, splenomegaly, gallbladder sludge -CTA abdomen/pelvis 03/21/2022: esophageal varices without evidence of gastric varices, stigmata of cirrhosis and portal HTN with small-moderate volume ascites and splenomegaly; diffuse bowel wall edema/thickening involving  the stomach, small bowel, and colon, with the mucosal enhancement maintained.  ED Course: Afebrile, normotensive. Diagnostic paracentesis with 20 cc obtained and tested for SBP by EDP. Hgb normal, without leukocytosis, mild hyperkalemia and hypocalcemia, hyponatremia per baseline, ammonia 48. CT head with no acute findings. Received lactulose 30 g x1 dose. IMTS paged for admission for  concern for hepatic encephalopathy.  Past Medical History:  Diagnosis Date   Atherosclerosis    Cirrhosis (HCC)    Hepatitis C 1997   Hepatitis C    Hiatal hernia    History of brachytherapy 11/28/14,  12/05/14. 12/12/14   proximal vagina 24 gray   Radiation 10/10/2014 through 11/07/201610/04/2014 through 11/13/2014   Pelvis 45 gray    Vaginal cancer [facility])     Past Surgical History:  Procedure Laterality Date   COLPOSCOPY  08/30/14   and vaginal biopsy   ESOPHAGEAL BANDING  03/20/2022   Procedure: ESOPHAGEAL BANDING;  Surgeon: Lemar Lofty., MD;  Location: [facility] ENDOSCOPY;  Service: Gastroenterology;;   ESOPHAGEAL BANDING N/A 04/23/2022   Procedure: ESOPHAGEAL BANDING;  Surgeon: Beverley Fiedler, MD;  Location: [facility] ENDOSCOPY;  Service: Gastroenterology;  Laterality: N/A;   ESOPHAGOGASTRODUODENOSCOPY (EGD) WITH PROPOFOL N/A 03/20/2022   Procedure: ESOPHAGOGASTRODUODENOSCOPY (EGD) WITH PROPOFOL;  Surgeon: Meridee Score Netty Starring., MD;  Location: [facility] ENDOSCOPY;  Service: Gastroenterology;  Laterality: N/A;   ESOPHAGOGASTRODUODENOSCOPY (EGD) WITH PROPOFOL N/A 04/23/2022   Procedure: ESOPHAGOGASTRODUODENOSCOPY (EGD) WITH PROPOFOL;  Surgeon: Beverley Fiedler, MD;  Location: [facility] ENDOSCOPY;  Service: Gastroenterology;  Laterality: N/A;   IR PARACENTESIS  01/22/2022   IR PARACENTESIS  02/21/2022   IR PARACENTESIS  03/21/2022   IR PARACENTESIS  03/24/2022   IR PARACENTESIS  04/03/2022   IR PARACENTESIS  04/18/2022   IR PARACENTESIS  04/25/2022   IR PARACENTESIS  05/02/2022   IR PARACENTESIS  05/09/2022   IR PARACENTESIS  05/14/2022   IR PARACENTESIS  05/22/2022   ROBOTIC ASSISTED LAPAROSCOPIC HYSTERECTOMY AND SALPINGECTOMY  2012   Dr. Karie Soda, [place]   Meds:  Acetaminophen 1000 mg daily PRN moderate pain, fever, headache Premarin cream, small amount to finger tip inserted into vagina 2-3 times weekly at night Nadolol 20 mg daily Pantoprazole 40 mg BID Potassum chloride 20 mEq daily Rifaximin 550 mg BID Sucralfate 10 mLs once daily  Social:  Denies current or prior alcohol use. She smoked 1 ppd for ~20 years, quit several years ago. Denies current or prior illicit substance use including IVDU. 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 She lives with her husband, son, daughter, and grandson. She is independent in ADLs and IADLs, but does not drive.   Family History:  M: cancer, deceased F: MI, deceased Brother: healthy Son: healthy Daughter: healthy  Allergies: Allergies as of 05/26/2022 - Review Complete 05/26/2022  Allergen Reaction Noted   Ampicillin Nausea And Vomiting 03/25/2022   Flagyl [metronidazole] Nausea Only 09/13/2014    Review of Systems: A complete ROS was negative except as per HPI.   OBJECTIVE:   Physical Exam: Blood pressure 128/70, pulse 95, temperature 97.9 F (36.6 C), resp. rate 18, height 5\' 7"  (1.702 m), weight 63.5 kg, SpO2 98 %.  Constitutional: chronically ill-appearing female sitting in hospital bed, in no acute distress, jaundiced HENT: normocephalic atraumatic, mucous membranes moist Eyes: conjunctiva non-erythematous, scleral icterus  Neck: supple Cardiovascular: regular rate and rhythm, no m/r/g Pulmonary/Chest: normal work of breathing on room air, lungs clear to auscultation bilaterally Abdominal: soft, non-tender. Distended. Dullness to percussion. Positive fluid shift. Bowel sounds present. MSK: normal bulk and tone, 3+ pitting edema on left, 2+ on right Neurological: alert & oriented x 3, asterixis present Skin: warm and dry Psych: Pleasant affect  Labs: CBC    Component Value Date/Time   WBC 5.0 05/26/2022 1149   RBC 3.93  05/26/2022 1149   HGB 12.1 05/26/2022 1149   HGB 15.2 10/30/2014 1016   HCT 37.0 05/26/2022 1149   HCT 44.1 10/30/2014 1016   PLT 103 (L) 05/26/2022 1149   PLT 79 (L) 10/30/2014 1016   MCV 94.1 05/26/2022 1149   MCV 94.1 10/30/2014 1016   MCH 30.8 05/26/2022 1149   MCHC 32.7 05/26/2022 1149   RDW 20.2 (H) 05/26/2022 1149   RDW 12.0 10/30/2014 1016   LYMPHSABS 0.4 (L) 03/31/2022 1103   LYMPHSABS 0.6 (L) 10/30/2014 1016   MONOABS 0.5 03/31/2022 1103    MONOABS 0.3 10/30/2014 1016   EOSABS 0.1 03/31/2022 1103   EOSABS 0.5 10/30/2014 1016   BASOSABS 0.0 03/31/2022 1103   BASOSABS 0.0 10/30/2014 1016     CMP     Component Value Date/Time   NA 127 (L) 05/26/2022 1149   NA 125 (L) 04/22/2022 1035   NA 141 10/30/2014 1016   K 5.2 (H) 05/26/2022 1149   K 4.3 10/30/2014 1016   CL 98 05/26/2022 1149   CO2 24 05/26/2022 1149   CO2 29 10/30/2014 1016   GLUCOSE 89 05/26/2022 1149   GLUCOSE 81 10/30/2014 1016   BUN 12 05/26/2022 1149   BUN 12 04/22/2022 1035   BUN 14.4 10/30/2014 1016   CREATININE 0.68 05/26/2022 1149   CREATININE 0.8 10/30/2014 1016   CALCIUM 8.0 (L) 05/26/2022 1149   CALCIUM 9.6 10/30/2014 1016   PROT 6.1 (L) 05/26/2022 1306   PROT 6.7 04/22/2022 1035   PROT 7.4 10/30/2014 1016   ALBUMIN 2.4 (L) 05/26/2022 1306   ALBUMIN 3.3 (L) 04/22/2022 1035   ALBUMIN 3.7 10/30/2014 1016   AST 73 (H) 05/26/2022 1306   AST 91 (H) 10/30/2014 1016   ALT 39 05/26/2022 1306   ALT 103 (H) 10/30/2014 1016   ALKPHOS 225 (H) 05/26/2022 1306   ALKPHOS 129 10/30/2014 1016   BILITOT 2.7 (H) 05/26/2022 1306   BILITOT 2.9 (H) 04/22/2022 1035   BILITOT 0.82 10/30/2014 1016   GFRNONAA >60 05/26/2022 1149    Imaging: CT Head Wo Contrast  Result Date: 05/26/2022 CLINICAL DATA:  Mental status change, unknown cause EXAM: CT HEAD WITHOUT CONTRAST TECHNIQUE: Contiguous axial images were obtained from the base of the skull through the vertex without intravenous contrast. RADIATION DOSE REDUCTION: This exam was performed according to the departmental dose-optimization program which includes automated exposure control, adjustment of the mA and/or kV according to patient size and/or use of iterative reconstruction technique. COMPARISON:  None Available. FINDINGS: Brain: No evidence of acute infarction, hemorrhage, hydrocephalus, extra-axial collection or mass lesion/mass effect. Vascular: No hyperdense vessel or unexpected calcification. Skull:  Normal. Negative for fracture or focal lesion. Sinuses/Orbits: No middle ear or mastoid effusion. Paranasal sinuses are clear. Orbits are unremarkable. Other: None. IMPRESSION: No acute intracranial abnormality. Electronically Signed   By: Lorenza Cambridge M.D.   On: 05/26/2022 13:55    EKG: personally reviewed my interpretation is NSR, non-ischemic, PACs, normal T waves  ASSESSMENT & PLAN:   Assessment & Plan by Problem: Principal Problem:   Decompensated hepatic cirrhosis (HCC)   Ardele Lesperance is a 62 y.o. F with PMH incompletely treated HCV, hepatic cirrhosis, and recurrent vaginal cancer who presented to [facility] ED with AMS admitted for decompensated cirrhosis with hepatic encephalopathy.  Hepatic encephalopathy Decompensated hepatic cirrhosis Child-Pugh Class C Incompletely treated hepatitis C Hx bleeding esophageal varices Patient brought in due to concern for new confusion from baseline of fully oriented. Ammonia mildly elevated  at 48, comparable to last hospitalization for hepatic encephalopathy. Currently on rifaximin therapy alone as she has not tolerated lactulose in the past due to nausea and vomiting. Last BM reportedly today, and she has these daily. Hepatic function overall unchanged from 12d ago, improved if not stable over the last few months. PT-INR 19.4, 1.6. Platelets 103 today, largely stable as well. No anemia, no leukocytosis, afebrile, VSS. No concern for infection at this time, CT head without acute intracranial process. Electrolyte abnormalities present but seem chronic and unlikely to cause acute encephalopathy, rather related to decompensated nature of her illness. More likely that acute encephalopathy is related to decompensated cirrhosis. On our exam, she is oriented x4. She has received lactulose 30 g x1 since being in ED. Documented to be taking midodrine 15 mg TID however she does not endorse taking this medication and we were unable to confirm with family at  admission. -Continue rifaximin 550 mg BID -Continue nadolol 20 mg daily -Continue protonix 40 mg BID -Continue carafate 10 mL BID -Hold midodrine 15 mg TID until able to confirm that she is taking this medication  Hx SBP, diuretic-refractory Hyponatremia, chronic Diagnostic paracentesis of 20 cc performed by EDP not concerning for active SBP (gram stain with no organisms; clear, yellow fluid with 24 total nucleated cells, glucose 128; culture pending) however it has been recommended that she remain on bactrim 800-160 mg BID for SBP prophylaxis; she denies currently taking this medication. She endorses abdominal pain in the epigastric region. Patient has been seen by specialist at [facility] and is in consideration for liver transplant given significant decompensation. Her ascites requires near-weekly large-volume paracentesis and is not responsive to diuretic therapy, and previously prescribed furosemide 40 mg daily and spironolactone 100 mg daily have been on hold due to hyponatremia. Last therapeutic paracentesis 05/16. -Continue bactrim 800-160 mg BID indefinitely -Trend BMP -Consider paracentesis this admission if indicated  -Low sodium diet  Hyperkalemia K 5.2. She takes potassium chloride 20 mEq as outpatient. No EKG changes. -Lokelma 10 g x1 dose -Hold home potassium chloride 20 mEq daily -Trend BMP  Hx vaginal cancer Follows with OB/Gyn. Per their notes, she is doing well and several years out from treatment. Most recent pap smear 04/22/2022 with ASCUS, OB/Gyn aware and has counseled her on this. 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 -Continue home premarin cream  Diet:  Na restricted VTE: SCDs IVF: None Code: Full  Prior to Admission Living Arrangement:  Home Barriers to Discharge: Medical stability  Dispo: Admit patient to Inpatient with expected length of stay greater than 2 midnights.  Signed: Adron Bene, MD Internal  Medicine Resident PGY-1  05/27/2022, 6:20 AM

## 2022-05-26 NOTE — Hospital Course (Addendum)
[facility] pt Acutely confused for 2 days, typically a and ox4, daughter said mental status Abd pain, N/V Paracentesis dx/ theraputic in ED Therapeutic paracentesis 4 days ago  Medical history- cirrhosis hepatitis C cad   ED course: afebrile UA nve Ct head -ve   Paracentesis Glucose 128 PMNs 24 Lactulose Lidocaine- epi   Cirrhosis    Hyponatremia Na 127  Hyperkalemia 5.2  Thrombocytopenia Platelets 103    Has been vomiting large volume for last 1 week. Denies hematemesis or coffee ground emesis. Denies fever or chills. Is having abdominal pain. No recent illnesses, no sick contacts. Abdomen is swelling but unchanged from prior. Normally goes once weekly in Wednesday-Friday time frame, goes to [facility] hospital. Has been eating and drinking okay. Has been seeming more confused per her husband but she doesn't know if she agrees with that. Has been hospitalized before for this, threw up blood that time. Was given several medications and was sent home.  Has been having BM daily.  Does she know how she got hep c???--says she got it when she was a kid and a lot of her friends got it too. Grew up in [place], [place]  Meds Acetaminophen 1000 mg daily PRN moderate pain, fever, headache Premarin cream, small amount to finger tip inserted into vagina 2-3 times weekly at night Nadolol 20 mg daily Pantoprazole 40 mg BID Potassum chloride 20 mEq daily Rifaximin 550 mg BID Sucralfate 10 mLs once daily  Fam hx M: cancer, deceased F: MI, deceased Brother: healthy Son: healthy Daughter healthy  Sochx Untreated hepatitis c. Never alcohol, not now. Used to smoke tobacco, 1 ppd x ~20y. Denies illicit substance use now and in past. Lives with husband, son, daughter, grandson. 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 Independent in ADLs, IADLs but does not drive.  3+ L 2+R pitting edema LE Finger tips missing

## 2022-05-26 NOTE — ED Triage Notes (Addendum)
Per niece, pt having episodes of confusion, dizziness, vomiting clear emesis; spoke with gi doc this am, sent for evaluation; UTI a few weeks ago, given abx, did not finish ; hx liver disease; paracentesis Friday, drained 8L fluid; pt alert to self and place at present, denies pain, NAD; abdomen distended; pt endorses burning/itching, denies urinary frequency, denies fevers

## 2022-05-26 NOTE — Telephone Encounter (Signed)
Received a call from patient's niece, Debby Freiberg, stating that patient was having confusion, dizziness, swelling (fluid was drained 3 days ago), and vomiting clear fluids this morning.  She said she had been treated for a UTI and was given antibiotics, which patient did not take.  She wanted to know if you thought the confusion was from the UTI or possibly her end stage liver disease and whether or not she should be taken to the emergency room.  Please call niece and advise.  Thank you.

## 2022-05-26 NOTE — Telephone Encounter (Signed)
Spoke with pts niece and let her know she should take pt to the er as she has not been taking the antibiotics for the UTI. She verbalized understanding and will take her to the ER.

## 2022-05-26 NOTE — ED Notes (Signed)
ED TO INPATIENT HANDOFF REPORT  ED Nurse Name and Phone #:   S Name/Age/Gender Kathy Little 62 y.o. female Room/Bed: 003C/003C  Code Status   Code Status: Full Code  Home/SNF/Other Home Patient oriented to: self, place, time, and situation Is this baseline? Yes   Triage Complete: Triage complete  Chief Complaint Decompensated hepatic cirrhosis (HCC) [K72.90, K74.60]  Triage Note Per niece, pt having episodes of confusion, dizziness, vomiting clear emesis; spoke with gi doc this am, sent for evaluation; UTI a few weeks ago, given abx, did not finish ; hx liver disease; paracentesis Friday, drained 8L fluid; pt alert to self and place at present, denies pain, NAD; abdomen distended; pt endorses burning/itching, denies urinary frequency, denies fevers   Allergies Allergies  Allergen Reactions   Ampicillin Nausea And Vomiting   Flagyl [Metronidazole] Nausea Only    Level of Care/Admitting Diagnosis ED Disposition     ED Disposition  Admit   Condition  --   Comment  Hospital Area: [facility] [[id number]]  Level of Care: Med-Surg [16]  May admit patient to [facility] or [facility] if equivalent level of care is available:: No  Covid Evaluation: Asymptomatic - no recent exposure (last 10 days) testing not required  Diagnosis: Decompensated hepatic cirrhosis [facility]) [[id number]]  Admitting Physician: Reymundo Poll [[id number]]  Attending Physician: Reymundo Poll [[id number]]  Certification:: I certify this patient will need inpatient services for at least 2 midnights  Estimated Length of Stay: 2          B Medical/Surgery History Past Medical History:  Diagnosis Date   Atherosclerosis    Cirrhosis (HCC)    Hepatitis C 1997   Hepatitis C    Hiatal hernia    History of brachytherapy 11/28/14, 12/05/14. 12/12/14   proximal vagina 24 gray   Radiation 10/10/2014 through 11/07/201610/04/2014 through 11/13/2014   Pelvis 45 gray    Vaginal cancer  (HCC)    Past Surgical History:  Procedure Laterality Date   COLPOSCOPY  08/30/14   and vaginal biopsy   ESOPHAGEAL BANDING  03/20/2022   Procedure: ESOPHAGEAL BANDING;  Surgeon: Lemar Lofty., MD;  Location: [facility] ENDOSCOPY;  Service: Gastroenterology;;   ESOPHAGEAL BANDING N/A 04/23/2022   Procedure: ESOPHAGEAL BANDING;  Surgeon: [place] Fiedler, MD;  Location: [facility] ENDOSCOPY;  Service: Gastroenterology;  Laterality: N/A;   ESOPHAGOGASTRODUODENOSCOPY (EGD) WITH PROPOFOL N/A 03/20/2022   Procedure: ESOPHAGOGASTRODUODENOSCOPY (EGD) WITH PROPOFOL;  Surgeon: Meridee Score Netty Starring., MD;  Location: [facility] ENDOSCOPY;  Service: Gastroenterology;  Laterality: N/A;   ESOPHAGOGASTRODUODENOSCOPY (EGD) WITH PROPOFOL N/A 04/23/2022   Procedure: ESOPHAGOGASTRODUODENOSCOPY (EGD) WITH PROPOFOL;  Surgeon: [place] Fiedler, MD;  Location: [facility] ENDOSCOPY;  Service: Gastroenterology;  Laterality: N/A;   IR PARACENTESIS  01/22/2022   IR PARACENTESIS  02/21/2022   IR PARACENTESIS  03/21/2022   IR PARACENTESIS  03/24/2022   IR PARACENTESIS  04/03/2022   IR PARACENTESIS  04/18/2022   IR PARACENTESIS  04/25/2022   IR PARACENTESIS  05/02/2022   IR PARACENTESIS  05/09/2022   IR PARACENTESIS  05/14/2022   IR PARACENTESIS  05/22/2022   ROBOTIC ASSISTED LAPAROSCOPIC HYSTERECTOMY AND SALPINGECTOMY  2012   Dr. Karie Soda, [facility]     A IV Location/Drains/Wounds Patient Lines/Drains/Airways Status     Active Line/Drains/Airways     Name Placement date Placement time Site Days   Peripheral IV 05/22/22 24 G Right Antecubital 05/22/22  1345  Antecubital  4            Intake/Output  Last 24 hours No intake or output data in the 24 hours ending 05/26/22 2106  Labs/Imaging Results for orders placed or performed during the hospital encounter of 05/26/22 (from the past 48 hour(s))  CBC     Status: Abnormal   Collection Time: 05/26/22 11:49 AM  Result Value Ref Range   WBC 5.0 4.0 - 10.5 K/uL   RBC 3.93 3.87 - 5.11 MIL/uL   Hemoglobin  12.1 12.0 - 15.0 g/dL   HCT 29.5 62.1 - 30.8 %   MCV 94.1 80.0 - 100.0 fL   MCH 30.8 26.0 - 34.0 pg   MCHC 32.7 30.0 - 36.0 g/dL   RDW 65.7 (H) 84.6 - 96.2 %   Platelets 103 (L) 150 - 400 K/uL    Comment: REPEATED TO VERIFY   nRBC 0.0 0.0 - 0.2 %    Comment: Performed at [facility] Lab, [address] [place], [place] [address]  Basic metabolic panel     Status: Abnormal   Collection Time: 05/26/22 11:49 AM  Result Value Ref Range   Sodium 127 (L) 135 - 145 mmol/L   Potassium 5.2 (H) 3.5 - 5.1 mmol/L   Chloride 98 98 - 111 mmol/L   CO2 24 22 - 32 mmol/L   Glucose, Bld 89 70 - 99 mg/dL    Comment: Glucose reference range applies only to samples taken after fasting for at least 8 hours.   BUN 12 8 - 23 mg/dL   Creatinine, Ser 1.32 0.44 - 1.00 mg/dL   Calcium 8.0 (L) 8.9 - 10.3 mg/dL   GFR, Estimated >44 >01 mL/min    Comment: (NOTE) Calculated using the CKD-EPI Creatinine Equation (2021)    Anion gap 5 5 - 15    Comment: Performed at [facility], [address] [place], [place] [address]  Ammonia     Status: Abnormal   Collection Time: 05/26/22 11:49 AM  Result Value Ref Range   Ammonia 48 (H) 9 - 35 umol/L    Comment: Performed at [facility], [address] [place], [place] [address]  Urinalysis, Routine w reflex microscopic -Urine, Clean Catch     Status: Abnormal   Collection Time: 05/26/22 11:55 AM  Result Value Ref Range   Color, Urine AMBER (A) YELLOW    Comment: BIOCHEMICALS MAY BE AFFECTED BY COLOR   APPearance HAZY (A) CLEAR   Specific Gravity, Urine 1.028 1.005 - 1.030   pH 5.0 5.0 - 8.0   Glucose, UA NEGATIVE NEGATIVE mg/dL   Hgb urine dipstick NEGATIVE NEGATIVE   Bilirubin Urine NEGATIVE NEGATIVE   Ketones, ur NEGATIVE NEGATIVE mg/dL   Protein, ur NEGATIVE NEGATIVE mg/dL   Nitrite NEGATIVE NEGATIVE   Leukocytes,Ua NEGATIVE NEGATIVE    Comment: Performed at [facility], [address] [place], [place] [address]  Gram stain      Status: None   Collection Time: 05/26/22 12:49 PM   Specimen: Body Fluid  Result Value Ref Range   Specimen Description FLUID    Special Requests ASCITIES    Gram Stain      WBC PRESENT,BOTH PMN AND MONONUCLEAR NO ORGANISMS SEEN CYTOSPIN SMEAR Performed at [facility], [address], [place], [place] [address]    Report Status 05/26/2022 FINAL   Hepatic function panel     Status: Abnormal   Collection Time: 05/26/22  1:06 PM  Result Value Ref Range   Total Protein 6.1 (L) 6.5 - 8.1  g/dL   Albumin 2.4 (L) 3.5 - 5.0 g/dL   AST 73 (H) 15 - 41 U/L   ALT 39 0 - 44 U/L   Alkaline Phosphatase 225 (H) 38 - 126 U/L   Total Bilirubin 2.7 (H) 0.3 - 1.2 mg/dL   Bilirubin, Direct 1.0 (H) 0.0 - 0.2 mg/dL   Indirect Bilirubin 1.7 (H) 0.3 - 0.9 mg/dL    Comment: Performed at [facility], [address], [place], [place] [address]  Magnesium     Status: None   Collection Time: 05/26/22  1:06 PM  Result Value Ref Range   Magnesium 1.8 1.7 - 2.4 mg/dL    Comment: Performed at [facility], [address] [place], [place] [address]  Troponin I (High Sensitivity)     Status: None   Collection Time: 05/26/22  1:06 PM  Result Value Ref Range   Troponin I (High Sensitivity) 17 <18 ng/L    Comment: (NOTE) Elevated high sensitivity troponin I (hsTnI) values and significant  changes across serial measurements may suggest ACS but many other  chronic and acute conditions are known to elevate hsTnI results.  Refer to the "Links" section for chest pain algorithms and additional  guidance. Performed at [facility] Lab, [address], [place], [place] [address]   CBG monitoring, ED     Status: Abnormal   Collection Time: 05/26/22  2:06 PM  Result Value Ref Range   Glucose-Capillary 148 (H) 70 - 99 mg/dL    Comment: Glucose reference range applies only to samples taken after fasting for at least 8 hours.  Body fluid cell count with differential     Status: Abnormal    Collection Time: 05/26/22  4:00 PM  Result Value Ref Range   Fluid Type-FCT ASCITES    Color, Fluid YELLOW    Appearance, Fluid CLEAR CLEAR   Total Nucleated Cell Count, Fluid 24 0 - 1,000 cu mm   Neutrophil Count, Fluid 5 0 - 25 %   Lymphs, Fluid 57 %   Monocyte-Macrophage-Serous Fluid 38 (L) 50 - 90 %   Eos, Fluid 0 %    Comment: Performed at [facility] Lab, [address], [place], [place] [address]  Glucose, pleural or peritoneal fluid     Status: None   Collection Time: 05/26/22  4:00 PM  Result Value Ref Range   Glucose, Fluid 128 mg/dL    Comment: (NOTE) No normal range established for this test Results should be evaluated in conjunction with serum values    Fluid Type-FGLU ASCITES     Comment: Performed at [facility], [address], [place], [place] [address]  Troponin I (High Sensitivity)     Status: None   Collection Time: 05/26/22  5:40 PM  Result Value Ref Range   Troponin I (High Sensitivity) 15 <18 ng/L    Comment: (NOTE) Elevated high sensitivity troponin I (hsTnI) values and significant  changes across serial measurements may suggest ACS but many other  chronic and acute conditions are known to elevate hsTnI results.  Refer to the "Links" section for chest pain algorithms and additional  guidance. Performed at [facility], [address] [place], [place] [address]    CT Head Wo Contrast  Result Date: 05/26/2022 CLINICAL DATA:  Mental status change, unknown cause EXAM: CT HEAD WITHOUT CONTRAST TECHNIQUE: Contiguous axial images were obtained from the base of the skull through the vertex without intravenous contrast. RADIATION  DOSE REDUCTION: This exam was performed according to the departmental dose-optimization program which includes automated exposure control, adjustment of the mA and/or kV according to patient size and/or use of iterative reconstruction technique. COMPARISON:  None Available. FINDINGS: Brain: No evidence of acute infarction,  hemorrhage, hydrocephalus, extra-axial collection or mass lesion/mass effect. Vascular: No hyperdense vessel or unexpected calcification. Skull: Normal. Negative for fracture or focal lesion. Sinuses/Orbits: No middle ear or mastoid effusion. Paranasal sinuses are clear. Orbits are unremarkable. Other: None. IMPRESSION: No acute intracranial abnormality. Electronically Signed   By: Lorenza Cambridge M.D.   On: 05/26/2022 13:55    Pending Labs Unresulted Labs (From admission, onward)     Start     Ordered   05/26/22 2045  Protime-INR  Once,   R        05/26/22 2047   05/26/22 1249  Culture, body fluid w Gram Stain-bottle  Once,   R        05/26/22 1249            Vitals/Pain Today's Vitals   05/26/22 1700 05/26/22 1745 05/26/22 1800 05/26/22 1839  BP: 108/68 116/81 128/70   Pulse: 81 81 95   Resp:   18   Temp:    97.9 F (36.6 C)  TempSrc:      SpO2:  100% 98%   Weight:      Height:      PainSc:        Isolation Precautions No active isolations  Medications Medications  acetaminophen (TYLENOL) tablet 650 mg (has no administration in time range)    Or  acetaminophen (TYLENOL) suppository 650 mg (has no administration in time range)  sodium zirconium cyclosilicate (LOKELMA) packet 10 g (has no administration in time range)  lidocaine-EPINEPHrine (XYLOCAINE W/EPI) 2 %-1:200000 (PF) injection 10 mL (10 mLs Intradermal Given 05/26/22 1559)  lactulose (CHRONULAC) 10 GM/15ML solution 30 g (30 g Oral Given 05/26/22 1432)    Mobility walks     Focused Assessments    R Recommendations: See Admitting Provider Note  Report given to:   Additional Notes:

## 2022-05-27 ENCOUNTER — Other Ambulatory Visit (HOSPITAL_COMMUNITY): Payer: Self-pay

## 2022-05-27 DIAGNOSIS — K7290 Hepatic failure, unspecified without coma: Secondary | ICD-10-CM

## 2022-05-27 LAB — CBC
HCT: 33.6 % — ABNORMAL LOW (ref 36.0–46.0)
Hemoglobin: 11.3 g/dL — ABNORMAL LOW (ref 12.0–15.0)
MCH: 31 pg (ref 26.0–34.0)
MCHC: 33.6 g/dL (ref 30.0–36.0)
MCV: 92.3 fL (ref 80.0–100.0)
Platelets: UNDETERMINED 10*3/uL (ref 150–400)
RBC: 3.64 MIL/uL — ABNORMAL LOW (ref 3.87–5.11)
RDW: 20.3 % — ABNORMAL HIGH (ref 11.5–15.5)
WBC: 4.2 10*3/uL (ref 4.0–10.5)
nRBC: 0 % (ref 0.0–0.2)

## 2022-05-27 LAB — BASIC METABOLIC PANEL
Anion gap: 6 (ref 5–15)
BUN: 13 mg/dL (ref 8–23)
CO2: 19 mmol/L — ABNORMAL LOW (ref 22–32)
Calcium: 7.9 mg/dL — ABNORMAL LOW (ref 8.9–10.3)
Chloride: 101 mmol/L (ref 98–111)
Creatinine, Ser: 0.67 mg/dL (ref 0.44–1.00)
GFR, Estimated: >60 mL/min (ref >60)
Glucose, Bld: 96 mg/dL (ref 70–99)
Potassium: 4.9 mmol/L (ref 3.5–5.1)
Sodium: 126 mmol/L — ABNORMAL LOW (ref 135–145)

## 2022-05-27 LAB — GLUCOSE, CAPILLARY: Glucose-Capillary: 188 mg/dL — ABNORMAL HIGH (ref 70–99)

## 2022-05-27 LAB — CULTURE, BODY FLUID W GRAM STAIN -BOTTLE

## 2022-05-27 MED ORDER — NADOLOL 20 MG PO TABS
20.0000 mg | ORAL_TABLET | Freq: Every day | ORAL | 0 refills | Status: AC
  Filled 2022-05-27: qty 30, 30d supply, fill #0

## 2022-05-27 MED ORDER — PANTOPRAZOLE SODIUM 40 MG PO TBEC
40.0000 mg | DELAYED_RELEASE_TABLET | Freq: Two times a day (BID) | ORAL | 0 refills | Status: AC
  Filled 2022-05-27: qty 60, 30d supply, fill #0

## 2022-05-27 MED ORDER — ONDANSETRON 4 MG PO TBDP
4.0000 mg | ORAL_TABLET | Freq: Three times a day (TID) | ORAL | 0 refills | Status: DC | PRN
  Filled 2022-05-27: qty 9, 15d supply, fill #0

## 2022-05-27 MED ORDER — MIDODRINE HCL 10 MG PO TABS
15.0000 mg | ORAL_TABLET | Freq: Three times a day (TID) | ORAL | 2 refills | Status: DC
  Filled 2022-05-27: qty 135, 30d supply, fill #0

## 2022-05-27 MED ORDER — SUCRALFATE 1 GM/10ML PO SUSP
1.0000 g | Freq: Two times a day (BID) | ORAL | 0 refills | Status: AC
  Filled 2022-05-27: qty 600, 30d supply, fill #0

## 2022-05-27 MED ORDER — RIFAXIMIN 550 MG PO TABS
550.0000 mg | ORAL_TABLET | Freq: Two times a day (BID) | ORAL | 0 refills | Status: AC
  Filled 2022-05-27: qty 60, 30d supply, fill #0

## 2022-05-27 NOTE — Discharge Summary (Addendum)
Name: Kathy Little MRN: [id number] DOB: 19-Nov-1960 62 y.o. PCP: Adron Bene, MD (Inactive)  Date of Admission: 05/26/2022 11:12 AM Date of Discharge: 05/27/22 Attending Physician: Dr. Antony Contras  Discharge Diagnosis: Principal Problem:   Decompensated hepatic cirrhosis [facility])   Discharge Medications: Allergies as of 05/27/2022       Reactions   Ampicillin Nausea And Vomiting   Flagyl [metronidazole] Nausea Only        Medication List     STOP taking these medications    potassium chloride SA 20 MEQ tablet Commonly known as: KLOR-CON M       TAKE these medications    acetaminophen 500 MG tablet Commonly known as: TYLENOL Take 1,000 mg by mouth daily as needed for moderate pain, fever or headache.   midodrine 10 MG tablet Commonly known as: PROAMATINE Take 1.5 tablets (15 mg total) by mouth 3 (three) times daily.   nadolol 20 MG tablet Commonly known as: Corgard Take 1 tablet (20 mg total) by mouth daily.   ondansetron 4 MG disintegrating tablet Commonly known as: ZOFRAN-ODT Dissolve 1 tablet (4 mg total) in mouth every 8 (eight) hours as needed for up to 3 days for nausea or vomiting.   pantoprazole 40 MG tablet Commonly known as: PROTONIX Take 1 tablet (40 mg total) by mouth 2 (two) times daily.   Premarin vaginal cream Generic drug: conjugated estrogens Place small amount on your finger (don't use applicator) and insert into the vagina 2-3 times a week at night   rifaximin 550 MG Tabs tablet Commonly known as: XIFAXAN Take 1 tablet (550 mg total) by mouth 2 (two) times daily.   sucralfate 1 GM/10ML suspension Commonly known as: CARAFATE Take 10 mLs (1 g total) by mouth 2 (two) times daily.   sulfamethoxazole-trimethoprim 800-160 MG tablet Commonly known as: BACTRIM DS Take 1 tablet by mouth daily.        Disposition and follow-up:   [name] was discharged from [facility] in Stable condition.  At the hospital  follow up visit please address:  1.  Follow-up: a. Whether or not to continue holding the lasix and spironolactone. These (and your potassium) were held at time of discharge given prior hyponatremia.  b. Please follow-up with IMTS clinic and GI. Your next appointment is with Dr. Montez Morita on 5/30 at 1:15pm to obtain labs. Please follow-up with GI for whether or not you may need another paracentesis.   2.  Labs / imaging needed at time of follow-up: CBC, BMP, ammonia, hepatic function panel  3.  Pending labs/ test needing follow-up: none  Follow-up Appointments:  Follow-up Information      [facility] Go on 06/05/2022.   Why: Please go to your appointment with Dr. Montez Morita on 06/05/2022 at 1:15pm Contact information: [address] [place] [address] [phone]        Annamarie Major, CRNP. Go to.   Specialty: Nurse Practitioner Why: Go to your appointment with Annamarie Major on 6/24 at 11am. Contact information: [address] [place] [address] [phone]                  Hospital Course by problem list: #Hepatic encephalopathy #Decompensated hepatic cirrhosis #Child-Pugh Class C #Incompletely treated hepatitis C #Hx bleeding esophageal varices Acute encephalopathy most likely related to decompensated cirrhosis. No evidence of GI bleeding. No shunts, not recommended for TIPS unless she develops life-threatening GI bleed. Most recent CTA with esophageal varices without evidence  of gastric varices, stigmata of cirrhosis and portan HTN. Mental status much improved by time of discharge. Ammonia decreased from prior 48>52. Currently on rifaximin therapy alone as she has not tolerated lactulose in the past due to nausea and vomiting. Received lactulose x1 in ED. Ascitic fluid with WBC, no organisms. Ascites Cx with NGTD. Last BM yesterday, and no recent constipation. Hepatic function stable from prior. PT slightly increased. INR  stable. CT head unremarkable. Hyponatremia Na 127, stable and chronic from prior. Hyperkalemia with K 5.2 which resolved with lokelma at time of discharge. Advised to continue all of her home medications including midodrine.   #Hx SBP, diuretic-refractory #Hyponatremia, chronic Diagnostic paracentesis by EDP not concerning for active SBP however she remains on bactrim 800-160 mg BID for SBP prophylaxis. She has been non-adherent. Her ascites requires near-weekly large-volume paracentesis and is not responsive to diuretic therapy, and previously prescribed furosemide 40 mg daily and spironolactone 100 mg daily have been on hold due to hyponatremia. Hyponatremia Na 127, chronic from prior (122-126). Last therapeutic paracentesis 05/16. She was advised to continue holding lasix and spironolactone and to take her bactrim.   #Hyperkalemia, resolved  Hyperkalemia resolved with lokelma x1. She was advised to hold home potassium on discharge.   #Hx vaginal cancer Follows with OB/Gyn. Per their notes, she is doing well and several years out from treatment. Most recent pap smear 04/22/2022 with ASCUS, OB/Gyn aware and has counseled her on this. 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  Subjective: She is feeling much better today. No pain or confusion. No recent changes in medications. Ready to discharge today.   Discharge Vitals:   BP 110/78 (BP Location: Left Arm)   Pulse 88   Temp 97.9 F (36.6 C)   Resp 18   Ht 5\' 7"  (1.702 m)   Wt 140 lb (63.5 kg)   SpO2 98%   BMI 21.93 kg/m   Discharge exam: General: Pleasant, jaundiced sitting up at side of bed. No acute distress. CV: RRR. No murmurs, rubs, or gallops. No LE edema Pulmonary: Lungs CTAB. Normal effort. No wheezing or rales. Abdominal: Soft, nontender, distended. Normal bowel sounds. Dullness to percussion. Extremities: Radial and DP pulses 2+ and symmetric. Normal ROM. Skin: Warm and dry. No  obvious rash or lesions. Neuro: Alert and oriented to self, place, situation. Moves all extremities. Normal sensation. No focal deficit. Psych: Normal mood and affect  Pertinent Labs, Studies, and Procedures:     Latest Ref Rng & Units 05/27/2022    8:38 AM 05/26/2022   11:49 AM 05/14/2022   11:54 PM  CBC  WBC 4.0 - 10.5 K/uL 4.2  5.0  4.1   Hemoglobin 12.0 - 15.0 g/dL 16.1  09.6  04.5   Hematocrit 36.0 - 46.0 % 33.6  37.0  36.1   Platelets 150 - 400 K/uL PLATELET CLUMPS NOTED ON SMEAR, UNABLE TO ESTIMATE  103  122        Latest Ref Rng & Units 05/27/2022    8:38 AM 05/26/2022    1:06 PM 05/26/2022   11:49 AM  CMP  Glucose 70 - 99 mg/dL 96   89   BUN 8 - 23 mg/dL 13   12   Creatinine 4.09 - 1.00 mg/dL 8.11   9.14   Sodium 782 - 145 mmol/L 126   127   Potassium 3.5 - 5.1 mmol/L 4.9   5.2   Chloride 98 -  111 mmol/L 101   98   CO2 22 - 32 mmol/L 19   24   Calcium 8.9 - 10.3 mg/dL 7.9   8.0   Total Protein 6.5 - 8.1 g/dL  6.1    Total Bilirubin 0.3 - 1.2 mg/dL  2.7    Alkaline Phos 38 - 126 U/L  225    AST 15 - 41 U/L  73    ALT 0 - 44 U/L  39      CT Head Wo Contrast  Result Date: 05/26/2022 CLINICAL DATA:  Mental status change, unknown cause EXAM: CT HEAD WITHOUT CONTRAST TECHNIQUE: Contiguous axial images were obtained from the base of the skull through the vertex without intravenous contrast. RADIATION DOSE REDUCTION: This exam was performed according to the departmental dose-optimization program which includes automated exposure control, adjustment of the mA and/or kV according to patient size and/or use of iterative reconstruction technique. COMPARISON:  None Available. FINDINGS: Brain: No evidence of acute infarction, hemorrhage, hydrocephalus, extra-axial collection or mass lesion/mass effect. Vascular: No hyperdense vessel or unexpected calcification. Skull: Normal. Negative for fracture or focal lesion. Sinuses/Orbits: No middle ear or mastoid effusion. Paranasal sinuses are  clear. Orbits are unremarkable. Other: None. IMPRESSION: No acute intracranial abnormality. Electronically Signed   By: Lorenza Cambridge M.D.   On: 05/26/2022 13:55     Discharge Instructions: Discharge Instructions     Call MD for:  difficulty breathing, headache or visual disturbances   Complete by: As directed    Call MD for:  extreme fatigue   Complete by: As directed    Call MD for:  hives   Complete by: As directed    Call MD for:  persistant dizziness or light-headedness   Complete by: As directed    Call MD for:  persistant nausea and vomiting   Complete by: As directed    Call MD for:  redness, tenderness, or signs of infection (pain, swelling, redness, odor or green/yellow discharge around incision site)   Complete by: As directed    Call MD for:  severe uncontrolled pain   Complete by: As directed    Call MD for:  temperature >100.4   Complete by: As directed    Diet - low sodium heart healthy   Complete by: As directed    Increase activity slowly   Complete by: As directed         Discharge Instructions      To Ms. Kyilee Coplin or their caretakers,  They were admitted to [facility] on 05/26/2022 for evaluation and treatment of:  Principal Problem:   Decompensated hepatic cirrhosis (HCC)  The evaluation suggested decompensated hepatic cirrhosis. They were treated with one dose of lactulose. Their abdominal fluid study did not show evidence of infection.   They were discharged from the hospital on 05/27/22. I recommend the following after leaving the hospital:  -Please continue taking all of your medications as prescribed. -Please follow up in Internal Medicine Clinic in 1 week to have your labs rechecked. -Please follow up with your GI doctors for your paracentesis    Follow-up Information     [facility]. Go on 06/05/2022.   Why: Please go to your appointment with Dr. Montez Morita on 06/05/2022 at 1:15pm Contact  information: [address] [place] [address] [phone]        Annamarie Major, CRNP. Go to.   Specialty: Nurse Practitioner Why: Go to your appointment with Annamarie Major  on 6/24 at 11am. Contact information: [address] [place] [address] [phone]                  Karie Fetch, MD, PGY-1 05/27/2022, 12:06 PM     Signed: Karie Fetch, MD, PGY-1 05/27/2022, 1:25 PM   Pager: [phone]

## 2022-05-27 NOTE — Discharge Instructions (Addendum)
To [name] or their caretakers,  They were admitted to [facility] on 05/26/2022 for evaluation and treatment of:  Principal Problem:   Decompensated hepatic cirrhosis (HCC)  The evaluation suggested decompensated hepatic cirrhosis. They were treated with one dose of lactulose. Their abdominal fluid study did not show evidence of infection.   They were discharged from the hospital on 05/27/22. I recommend the following after leaving the hospital:  -Please continue taking all of your medications as prescribed. -Please follow up in Internal Medicine Clinic in 1 week to have your labs rechecked. -Please follow up with your GI doctors for your paracentesis    Follow-up Information     [facility]. Go on 06/05/2022.   Why: Please go to your appointment with Dr. Montez Morita on 06/05/2022 at 1:15pm Contact information: [address] [place] [address] [phone]        Annamarie Major, CRNP. Go to.   Specialty: Nurse Practitioner Why: Go to your appointment with Annamarie Major on 6/24 at 11am. Contact information: [address] [place] [address] [phone]                  Karie Fetch, MD, PGY-1 05/27/2022, 12:06 PM

## 2022-05-27 NOTE — Progress Notes (Signed)
Patient c/o vomiting. On call provider contacted but I am still awaiting a response.

## 2022-05-29 ENCOUNTER — Telehealth: Payer: Self-pay | Admitting: Internal Medicine

## 2022-05-29 ENCOUNTER — Other Ambulatory Visit: Payer: Self-pay

## 2022-05-29 DIAGNOSIS — R188 Other ascites: Secondary | ICD-10-CM

## 2022-05-29 NOTE — Telephone Encounter (Signed)
Ok for LVP up to 8 L with IV alb Cell count

## 2022-05-29 NOTE — Telephone Encounter (Signed)
Pt scheduled for ir para as per Dr. Lauro Franklin orders at [facility] 05/30/22. Pt to arrive at 1:45pm for a 2pm appt. Pt aware of appt.

## 2022-05-29 NOTE — Telephone Encounter (Signed)
PT is calling to schedule a paracentesis. Please advise.

## 2022-05-30 ENCOUNTER — Ambulatory Visit (HOSPITAL_COMMUNITY)
Admission: RE | Admit: 2022-05-30 | Discharge: 2022-05-30 | Disposition: A | Discharge status: Home / Self Care | Payer: No Typology Code available for payment source | Source: Ambulatory Visit | Attending: Internal Medicine | Admitting: Internal Medicine

## 2022-05-30 DIAGNOSIS — R188 Other ascites: Secondary | ICD-10-CM | POA: Insufficient documentation

## 2022-05-30 DIAGNOSIS — K7460 Unspecified cirrhosis of liver: Secondary | ICD-10-CM | POA: Diagnosis not present

## 2022-05-30 HISTORY — PX: IR PARACENTESIS: IMG2679

## 2022-05-30 LAB — BODY FLUID CELL COUNT WITH DIFFERENTIAL
Eos, Fluid: 0 %
Lymphs, Fluid: 38 %
Monocyte-Macrophage-Serous Fluid: 61 % (ref 50–90)
Neutrophil Count, Fluid: 1 % (ref 0–25)
Total Nucleated Cell Count, Fluid: 19 cu mm (ref 0–1000)

## 2022-05-30 LAB — CULTURE, BODY FLUID W GRAM STAIN -BOTTLE: Culture: NO GROWTH

## 2022-05-30 MED ORDER — ALBUMIN HUMAN 25 % IV SOLN
50.0000 g | Freq: Once | INTRAVENOUS | Status: AC
  Administered 2022-05-30: 50 g via INTRAVENOUS

## 2022-05-30 MED ORDER — ALBUMIN HUMAN 25 % IV SOLN
INTRAVENOUS | Status: AC
  Filled 2022-05-30: qty 200

## 2022-05-30 MED ORDER — LIDOCAINE HCL 1 % IJ SOLN
INTRAMUSCULAR | Status: AC
  Filled 2022-05-30: qty 20

## 2022-05-30 NOTE — Procedures (Signed)
PROCEDURE SUMMARY:  Successful ultrasound guided paracentesis from the right lower quadrant.  Yielded 6.3 of clear yellow fluid.  No immediate complications.  The patient tolerated the procedure well.   Specimen sent for labs.  EBL < 2 mL  The patient has previously been formally evaluated by the [facility] and is being actively followed for potential future intervention.  ** Patient undergoing work up for liver transplant. Please see note by Dr. Elby Showers 04/25/22    Alwyn Ren, AGACNP-BC [phone] 05/30/2022, 3:31 PM

## 2022-05-31 LAB — CULTURE, BODY FLUID W GRAM STAIN -BOTTLE

## 2022-06-05 ENCOUNTER — Other Ambulatory Visit: Payer: Self-pay

## 2022-06-05 ENCOUNTER — Encounter: Payer: Medicaid Other | Admitting: Student

## 2022-06-05 ENCOUNTER — Emergency Department (HOSPITAL_COMMUNITY): Payer: No Typology Code available for payment source

## 2022-06-05 ENCOUNTER — Inpatient Hospital Stay (HOSPITAL_COMMUNITY)
Admission: EM | Admit: 2022-06-05 | Discharge: 2022-06-08 | DRG: 441 | Disposition: A | Discharge status: Home Health | Payer: No Typology Code available for payment source | Attending: Internal Medicine | Admitting: Internal Medicine

## 2022-06-05 DIAGNOSIS — E162 Hypoglycemia, unspecified: Secondary | ICD-10-CM | POA: Diagnosis present

## 2022-06-05 DIAGNOSIS — K7682 Hepatic encephalopathy: Secondary | ICD-10-CM | POA: Diagnosis present

## 2022-06-05 DIAGNOSIS — R4182 Altered mental status, unspecified: Principal | ICD-10-CM

## 2022-06-05 DIAGNOSIS — E8770 Fluid overload, unspecified: Secondary | ICD-10-CM | POA: Diagnosis present

## 2022-06-05 DIAGNOSIS — Z881 Allergy status to other antibiotic agents status: Secondary | ICD-10-CM | POA: Diagnosis not present

## 2022-06-05 DIAGNOSIS — Z560 Unemployment, unspecified: Secondary | ICD-10-CM

## 2022-06-05 DIAGNOSIS — R739 Hyperglycemia, unspecified: Secondary | ICD-10-CM

## 2022-06-05 DIAGNOSIS — B1920 Unspecified viral hepatitis C without hepatic coma: Secondary | ICD-10-CM | POA: Diagnosis present

## 2022-06-05 DIAGNOSIS — G9341 Metabolic encephalopathy: Secondary | ICD-10-CM | POA: Diagnosis present

## 2022-06-05 DIAGNOSIS — D696 Thrombocytopenia, unspecified: Secondary | ICD-10-CM | POA: Diagnosis present

## 2022-06-05 DIAGNOSIS — K7460 Unspecified cirrhosis of liver: Secondary | ICD-10-CM | POA: Diagnosis present

## 2022-06-05 DIAGNOSIS — K766 Portal hypertension: Secondary | ICD-10-CM | POA: Diagnosis present

## 2022-06-05 DIAGNOSIS — R64 Cachexia: Secondary | ICD-10-CM | POA: Diagnosis present

## 2022-06-05 DIAGNOSIS — T68XXXA Hypothermia, initial encounter: Secondary | ICD-10-CM | POA: Diagnosis not present

## 2022-06-05 DIAGNOSIS — Z7189 Other specified counseling: Secondary | ICD-10-CM | POA: Diagnosis not present

## 2022-06-05 DIAGNOSIS — E278 Other specified disorders of adrenal gland: Secondary | ICD-10-CM | POA: Diagnosis present

## 2022-06-05 DIAGNOSIS — K7290 Hepatic failure, unspecified without coma: Secondary | ICD-10-CM | POA: Diagnosis not present

## 2022-06-05 DIAGNOSIS — R188 Other ascites: Secondary | ICD-10-CM

## 2022-06-05 DIAGNOSIS — E871 Hypo-osmolality and hyponatremia: Secondary | ICD-10-CM | POA: Diagnosis present

## 2022-06-05 DIAGNOSIS — Z515 Encounter for palliative care: Secondary | ICD-10-CM | POA: Diagnosis not present

## 2022-06-05 DIAGNOSIS — D72819 Decreased white blood cell count, unspecified: Secondary | ICD-10-CM | POA: Diagnosis present

## 2022-06-05 DIAGNOSIS — R109 Unspecified abdominal pain: Secondary | ICD-10-CM | POA: Diagnosis present

## 2022-06-05 DIAGNOSIS — R278 Other lack of coordination: Secondary | ICD-10-CM | POA: Diagnosis present

## 2022-06-05 DIAGNOSIS — Z880 Allergy status to penicillin: Secondary | ICD-10-CM

## 2022-06-05 DIAGNOSIS — Z6821 Body mass index (BMI) 21.0-21.9, adult: Secondary | ICD-10-CM

## 2022-06-05 DIAGNOSIS — E872 Acidosis, unspecified: Secondary | ICD-10-CM | POA: Diagnosis present

## 2022-06-05 DIAGNOSIS — I959 Hypotension, unspecified: Secondary | ICD-10-CM | POA: Diagnosis not present

## 2022-06-05 DIAGNOSIS — Z8544 Personal history of malignant neoplasm of other female genital organs: Secondary | ICD-10-CM

## 2022-06-05 DIAGNOSIS — R680 Hypothermia, not associated with low environmental temperature: Secondary | ICD-10-CM | POA: Diagnosis present

## 2022-06-05 DIAGNOSIS — R9431 Abnormal electrocardiogram [ECG] [EKG]: Secondary | ICD-10-CM | POA: Diagnosis present

## 2022-06-05 DIAGNOSIS — Z8249 Family history of ischemic heart disease and other diseases of the circulatory system: Secondary | ICD-10-CM | POA: Diagnosis not present

## 2022-06-05 DIAGNOSIS — I8510 Secondary esophageal varices without bleeding: Secondary | ICD-10-CM | POA: Diagnosis present

## 2022-06-05 DIAGNOSIS — Z79899 Other long term (current) drug therapy: Secondary | ICD-10-CM | POA: Diagnosis not present

## 2022-06-05 DIAGNOSIS — E8809 Other disorders of plasma-protein metabolism, not elsewhere classified: Secondary | ICD-10-CM | POA: Diagnosis present

## 2022-06-05 DIAGNOSIS — Z809 Family history of malignant neoplasm, unspecified: Secondary | ICD-10-CM

## 2022-06-05 DIAGNOSIS — Z923 Personal history of irradiation: Secondary | ICD-10-CM

## 2022-06-05 LAB — COMPREHENSIVE METABOLIC PANEL
ALT: 48 U/L — ABNORMAL HIGH (ref 0–44)
AST: 89 U/L — ABNORMAL HIGH (ref 15–41)
Albumin: 2.9 g/dL — ABNORMAL LOW (ref 3.5–5.0)
Alkaline Phosphatase: 194 U/L — ABNORMAL HIGH (ref 38–126)
Anion gap: 6 (ref 5–15)
BUN: 15 mg/dL (ref 8–23)
CO2: 20 mmol/L — ABNORMAL LOW (ref 22–32)
Calcium: 8.4 mg/dL — ABNORMAL LOW (ref 8.9–10.3)
Chloride: 99 mmol/L (ref 98–111)
Creatinine, Ser: 0.86 mg/dL (ref 0.44–1.00)
GFR, Estimated: >60 mL/min (ref >60)
Glucose, Bld: 64 mg/dL — ABNORMAL LOW (ref 70–99)
Potassium: 4.4 mmol/L (ref 3.5–5.1)
Sodium: 125 mmol/L — ABNORMAL LOW (ref 135–145)
Total Bilirubin: 5.6 mg/dL — ABNORMAL HIGH (ref 0.3–1.2)
Total Protein: 7 g/dL (ref 6.5–8.1)

## 2022-06-05 LAB — URINALYSIS, W/ REFLEX TO CULTURE (INFECTION SUSPECTED)
Bacteria, UA: NONE SEEN
Glucose, UA: NEGATIVE mg/dL
Hgb urine dipstick: NEGATIVE
Ketones, ur: NEGATIVE mg/dL
Nitrite: NEGATIVE
Protein, ur: 30 mg/dL — AB
Specific Gravity, Urine: >1.046 — ABNORMAL HIGH (ref 1.005–1.030)
pH: 5 (ref 5.0–8.0)

## 2022-06-05 LAB — CBC WITH DIFFERENTIAL/PLATELET
Abs Immature Granulocytes: 0 10*3/uL (ref 0.00–0.07)
Basophils Absolute: 0 10*3/uL (ref 0.0–0.1)
Basophils Relative: 0 %
Eosinophils Absolute: 0 10*3/uL (ref 0.0–0.5)
Eosinophils Relative: 0 %
HCT: 40 % (ref 36.0–46.0)
Hemoglobin: 13.3 g/dL (ref 12.0–15.0)
Lymphocytes Relative: 28 %
Lymphs Abs: 1.1 10*3/uL (ref 0.7–4.0)
MCH: 31.1 pg (ref 26.0–34.0)
MCHC: 33.3 g/dL (ref 30.0–36.0)
MCV: 93.7 fL (ref 80.0–100.0)
Monocytes Absolute: 0.2 10*3/uL (ref 0.1–1.0)
Monocytes Relative: 6 %
Neutro Abs: 2.5 10*3/uL (ref 1.7–7.7)
Neutrophils Relative %: 66 %
Platelets: 84 10*3/uL — ABNORMAL LOW (ref 150–400)
RBC: 4.27 MIL/uL (ref 3.87–5.11)
RDW: 22.5 % — ABNORMAL HIGH (ref 11.5–15.5)
WBC: 3.8 10*3/uL — ABNORMAL LOW (ref 4.0–10.5)
nRBC: 0 % (ref 0.0–0.2)

## 2022-06-05 LAB — I-STAT CHEM 8, ED
BUN: 17 mg/dL (ref 8–23)
Calcium, Ion: 1.07 mmol/L — ABNORMAL LOW (ref 1.15–1.40)
Chloride: 98 mmol/L (ref 98–111)
Creatinine, Ser: 0.6 mg/dL (ref 0.44–1.00)
Glucose, Bld: 63 mg/dL — ABNORMAL LOW (ref 70–99)
HCT: 43 % (ref 36.0–46.0)
Hemoglobin: 14.6 g/dL (ref 12.0–15.0)
Potassium: 4.5 mmol/L (ref 3.5–5.1)
Sodium: 128 mmol/L — ABNORMAL LOW (ref 135–145)
TCO2: 20 mmol/L — ABNORMAL LOW (ref 22–32)

## 2022-06-05 LAB — BODY FLUID CELL COUNT WITH DIFFERENTIAL
Eos, Fluid: 0 %
Lymphs, Fluid: 32 %
Monocyte-Macrophage-Serous Fluid: 52 % (ref 50–90)
Neutrophil Count, Fluid: 16 % (ref 0–25)
Total Nucleated Cell Count, Fluid: 20 cu mm (ref 0–1000)

## 2022-06-05 LAB — LACTIC ACID, PLASMA: Lactic Acid, Venous: 1.9 mmol/L (ref 0.5–1.9)

## 2022-06-05 LAB — ACETAMINOPHEN LEVEL: Acetaminophen (Tylenol), Serum: <10 ug/mL — ABNORMAL LOW (ref 10–30)

## 2022-06-05 LAB — TSH: TSH: 2.627 u[IU]/mL (ref 0.350–4.500)

## 2022-06-05 LAB — PROTIME-INR
INR: 1.7 — ABNORMAL HIGH (ref 0.8–1.2)
Prothrombin Time: 20.6 seconds — ABNORMAL HIGH (ref 11.4–15.2)

## 2022-06-05 LAB — GRAM STAIN

## 2022-06-05 LAB — CBG MONITORING, ED: Glucose-Capillary: 98 mg/dL (ref 70–99)

## 2022-06-05 LAB — POC OCCULT BLOOD, ED: Fecal Occult Bld: POSITIVE — AB

## 2022-06-05 LAB — MAGNESIUM: Magnesium: 2 mg/dL (ref 1.7–2.4)

## 2022-06-05 LAB — PATHOLOGIST SMEAR REVIEW

## 2022-06-05 LAB — AMMONIA: Ammonia: 72 umol/L — ABNORMAL HIGH (ref 9–35)

## 2022-06-05 LAB — T4, FREE: Free T4: 1.03 ng/dL (ref 0.61–1.12)

## 2022-06-05 LAB — SALICYLATE LEVEL: Salicylate Lvl: <7 mg/dL — ABNORMAL LOW (ref 7.0–30.0)

## 2022-06-05 LAB — TYPE AND SCREEN
ABO/RH(D): A POS
Antibody Screen: NEGATIVE

## 2022-06-05 LAB — APTT: aPTT: 45 seconds — ABNORMAL HIGH (ref 24–36)

## 2022-06-05 MED ORDER — DEXTROSE 50 % IV SOLN
1.0000 | Freq: Once | INTRAVENOUS | Status: AC
  Administered 2022-06-05: 50 mL via INTRAVENOUS
  Filled 2022-06-05: qty 50

## 2022-06-05 MED ORDER — IOHEXOL 350 MG/ML SOLN
75.0000 mL | Freq: Once | INTRAVENOUS | Status: AC | PRN
  Administered 2022-06-05: 75 mL via INTRAVENOUS

## 2022-06-05 MED ORDER — LACTULOSE 10 GM/15ML PO SOLN
20.0000 g | Freq: Once | ORAL | Status: AC
  Administered 2022-06-05: 20 g via ORAL
  Filled 2022-06-05: qty 30

## 2022-06-05 MED ORDER — VANCOMYCIN HCL 1250 MG/250ML IV SOLN
1250.0000 mg | Freq: Once | INTRAVENOUS | Status: AC
  Administered 2022-06-05: 1250 mg via INTRAVENOUS
  Filled 2022-06-05: qty 250

## 2022-06-05 MED ORDER — VANCOMYCIN HCL 1250 MG/250ML IV SOLN
1250.0000 mg | INTRAVENOUS | Status: DC
  Administered 2022-06-06: 1250 mg via INTRAVENOUS
  Filled 2022-06-05 (×2): qty 250

## 2022-06-05 MED ORDER — SODIUM CHLORIDE 0.9 % IV SOLN
1.0000 g | Freq: Once | INTRAVENOUS | Status: AC
  Administered 2022-06-05: 1 g via INTRAVENOUS
  Filled 2022-06-05: qty 10

## 2022-06-05 MED ORDER — SODIUM CHLORIDE 0.9 % IV BOLUS
1000.0000 mL | Freq: Once | INTRAVENOUS | Status: AC
  Administered 2022-06-05: 1000 mL via INTRAVENOUS

## 2022-06-05 MED ORDER — LIDOCAINE HCL (PF) 1 % IJ SOLN
5.0000 mL | Freq: Once | INTRAMUSCULAR | Status: AC
  Administered 2022-06-05: 5 mL
  Filled 2022-06-05: qty 5

## 2022-06-05 MED ORDER — METRONIDAZOLE 500 MG/100ML IV SOLN
500.0000 mg | Freq: Once | INTRAVENOUS | Status: AC
  Administered 2022-06-05: 500 mg via INTRAVENOUS
  Filled 2022-06-05: qty 100

## 2022-06-05 NOTE — Progress Notes (Signed)
Pharmacy Antibiotic Note  Kathy Little is a 62 y.o. female for which pharmacy has been consulted for vancomycin dosing for sepsis.  Patient with a history of cirrhosis, history of esophageal varices and recent admission for hepatic encephalopathy. Patient presenting with weakness and AMS.  SCr 0.86 WBC 3.8; LA 1.9; T 88.4>90.5; HR 48>55; RR 13  Plan: Metronidazole & Rocephin per MD Vancomycin 1250 mg q24hr (eAUC 456.8) unless change in renal function Trend WBC, Fever, Renal function F/u cultures, clinical course, WBC De-escalate when able  Height: 5\' 7"  (170.2 cm) Weight: 63.5 kg (139 lb 15.9 oz) IBW/kg (Calculated) : 61.6  Temp (24hrs), Avg:88.4 F (31.3 C), Min:88.4 F (31.3 C), Max:88.4 F (31.3 C)  Recent Labs  Lab 06/05/22 1430 06/05/22 1445 06/05/22 1511  WBC  --  3.8*  --   CREATININE 0.60 0.86  --   LATICACIDVEN  --   --  1.9    Estimated Creatinine Clearance: 66.8 mL/min (by C-G formula based on SCr of 0.86 mg/dL).    Allergies  Allergen Reactions   Ampicillin Nausea And Vomiting   Flagyl [Metronidazole] Nausea Only   Microbiology results: Pending  Thank you for allowing pharmacy to be a part of this patient's care.  Delmar Landau, PharmD, BCPS 06/05/2022 5:17 PM ED Clinical Pharmacist -  [phone]

## 2022-06-05 NOTE — ED Provider Notes (Signed)
[place] EMERGENCY DEPARTMENT AT [facility] Provider Note   CSN: [id number] Arrival date & time: 06/05/22  1320     History  Chief Complaint  Patient presents with   Abdominal Pain   HPI Kathy Little is a 62 y.o. female with cirrhosis, history of esophageal varices and recent admission for hepatic encephalopathy presenting for weakness and altered mental status.  Patient aware of where she is and was able to tell me her birthday.  Could not identify simple objects when presented to her like a cell phone.  Able to move all extremities.  Responsive to painful stimuli.  Was able to answer simple questions with 1-2 word responses.  Overly cold on exam.  Temp was 88.4.  Abdomen was notably distended.  Patient cannot tell me how long it has been that way.  She has generalized abdominal pain.   .        Home Medications Prior to Admission medications   Medication Sig Start Date End Date Taking? Authorizing Provider  acetaminophen (TYLENOL) 500 MG tablet Take 1,000 mg by mouth daily as needed for moderate pain, fever or headache.    [provider]  conjugated estrogens (PREMARIN) vaginal cream Place small amount on your finger (don't use applicator) and insert into the vagina 2-3 times a week at night Patient not taking: Reported on 05/27/2022 05/02/22   Carver Fila, MD  midodrine (PROAMATINE) 10 MG tablet Take 1.5 tablets (15 mg total) by mouth 3 (three) times daily. 05/27/22 08/25/22  Karie Fetch, MD  nadolol (CORGARD) 20 MG tablet Take 1 tablet (20 mg total) by mouth daily. 05/27/22 06/26/22  Karie Fetch, MD  ondansetron (ZOFRAN-ODT) 4 MG disintegrating tablet Dissolve 1 tablet (4 mg total) in mouth every 8 (eight) hours as needed for up to 3 days for nausea or vomiting. 05/27/22 06/11/22  Karie Fetch, MD  pantoprazole (PROTONIX) 40 MG tablet Take 1 tablet (40 mg total) by mouth 2 (two) times daily. 05/27/22 06/26/22  Karie Fetch, MD  rifaximin  (XIFAXAN) 550 MG TABS tablet Take 1 tablet (550 mg total) by mouth 2 (two) times daily. 05/27/22 06/26/22  Karie Fetch, MD  sucralfate (CARAFATE) 1 GM/10ML suspension Take 10 mLs (1 g total) by mouth 2 (two) times daily. 05/27/22 06/26/22  Karie Fetch, MD  sulfamethoxazole-trimethoprim (BACTRIM DS) 800-160 MG tablet Take 1 tablet by mouth daily. 04/22/22 08/20/22  Adron Bene, MD      Allergies    Ampicillin and Flagyl [metronidazole]    Review of Systems   See HPI for pertinent positives   Physical Exam   Vitals:   06/06/22 0930 06/06/22 1100  BP: 127/74 (!) 121/53  Pulse: 76 68  Resp: 16 15  Temp: 98 F (36.7 C) 98.3 F (36.8 C)  SpO2: 100% 100%    CONSTITUTIONAL:  ill-appearing, NAD NEURO: Confused, somnolent, knows she is in the hospital, moving all extremities, responsive to painful stimuli, no notable facial droop, unable to assess cranial nerves. EYES:  eyes equal and reactive ENT/NECK:  Supple, no stridor  CARDIO:  regular rate and rhythm, appears well-perfused  PULM:  No respiratory distress, CTAB GI/GU: Distended, soft, nontender Rectal: Externally no fissures or hemorrhoids, internally rectal vault is smooth without mass or lesion, Hemoccult negative MSK/SPINE:  No gross deformities, edema noted in the left leg, 1+ pitting edema around the left ankle SKIN: Jaundiced, Cool to touch, no rash, atraumatic   *Additional and/or pertinent findings included in MDM below  ED Results / Procedures / Treatments   Labs (all labs ordered are listed, but only abnormal results are displayed) Labs Reviewed  LACTIC ACID, PLASMA - Abnormal; Notable for the following components:      Result Value   Lactic Acid, Venous 3.6 (*)    All other components within normal limits  COMPREHENSIVE METABOLIC PANEL - Abnormal; Notable for the following components:   Sodium 125 (*)    CO2 20 (*)    Glucose, Bld 64 (*)    Calcium 8.4 (*)    Albumin 2.9 (*)    AST 89 (*)     ALT 48 (*)    Alkaline Phosphatase 194 (*)    Total Bilirubin 5.6 (*)    All other components within normal limits  CBC WITH DIFFERENTIAL/PLATELET - Abnormal; Notable for the following components:   WBC 3.8 (*)    RDW 22.5 (*)    Platelets 84 (*)    All other components within normal limits  PROTIME-INR - Abnormal; Notable for the following components:   Prothrombin Time 20.6 (*)    INR 1.7 (*)    All other components within normal limits  APTT - Abnormal; Notable for the following components:   aPTT 45 (*)    All other components within normal limits  URINALYSIS, W/ REFLEX TO CULTURE (INFECTION SUSPECTED) - Abnormal; Notable for the following components:   Color, Urine AMBER (*)    APPearance HAZY (*)    Specific Gravity, Urine >1.046 (*)    Bilirubin Urine SMALL (*)    Protein, ur 30 (*)    Leukocytes,Ua MODERATE (*)    All other components within normal limits  AMMONIA - Abnormal; Notable for the following components:   Ammonia 72 (*)    All other components within normal limits  SALICYLATE LEVEL - Abnormal; Notable for the following components:   Salicylate Lvl <7.0 (*)    All other components within normal limits  ACETAMINOPHEN LEVEL - Abnormal; Notable for the following components:   Acetaminophen (Tylenol), Serum <10 (*)    All other components within normal limits  BODY FLUID CELL COUNT WITH DIFFERENTIAL - Abnormal; Notable for the following components:   Appearance, Fluid HAZY (*)    All other components within normal limits  COMPREHENSIVE METABOLIC PANEL - Abnormal; Notable for the following components:   Sodium 126 (*)    CO2 18 (*)    Glucose, Bld 164 (*)    Calcium 8.2 (*)    Total Protein 6.1 (*)    Albumin 2.5 (*)    AST 82 (*)    ALT 46 (*)    Alkaline Phosphatase 186 (*)    Total Bilirubin 4.6 (*)    All other components within normal limits  CBC - Abnormal; Notable for the following components:   WBC 2.6 (*)    RDW 22.4 (*)    Platelets 86 (*)     All other components within normal limits  PROTIME-INR - Abnormal; Notable for the following components:   Prothrombin Time 21.4 (*)    INR 1.8 (*)    All other components within normal limits  APTT - Abnormal; Notable for the following components:   aPTT 45 (*)    All other components within normal limits  LACTIC ACID, PLASMA - Abnormal; Notable for the following components:   Lactic Acid, Venous 2.5 (*)    All other components within normal limits  I-STAT CHEM 8, ED - Abnormal; Notable for the following  components:   Sodium 128 (*)    Glucose, Bld 63 (*)    Calcium, Ion 1.07 (*)    TCO2 20 (*)    All other components within normal limits  POC OCCULT BLOOD, ED - Abnormal; Notable for the following components:   Fecal Occult Bld POSITIVE (*)    All other components within normal limits  CULTURE, BLOOD (ROUTINE X 2)  CULTURE, BLOOD (ROUTINE X 2)  CULTURE, BODY FLUID W GRAM STAIN -BOTTLE  GRAM STAIN  LACTIC ACID, PLASMA  MAGNESIUM  TSH  T4, FREE  MISC [facility] TEST (SEND OUT)  LACTIC ACID, PLASMA  LACTIC ACID, PLASMA  CBG MONITORING, ED  CBG MONITORING, ED  CBG MONITORING, ED  CBG MONITORING, ED  TYPE AND SCREEN    EKG EKG Interpretation  Date/Time:  Thursday Jun 05 2022 13:59:08 EDT Ventricular Rate:  47 PR Interval:  162 QRS Duration: 128 QT Interval:  588 QTC Calculation: 520 R Axis:   71 Text Interpretation: Sinus bradycardia Nonspecific intraventricular conduction delay Abnrm T, consider ischemia, anterolateral lds Confirmed by Alvino Blood ([id number]) on 06/05/2022 4:41:31 PM  Radiology CT ABDOMEN PELVIS W CONTRAST  Result Date: 06/05/2022 CLINICAL DATA:  Abdominal pain, acute, nonlocalized. liver failure with ascites c/o of abdominal pain with c/o weakness EXAM: CT ABDOMEN AND PELVIS WITH CONTRAST TECHNIQUE: Multidetector CT imaging of the abdomen and pelvis was performed using the standard protocol following bolus administration of intravenous contrast.  RADIATION DOSE REDUCTION: This exam was performed according to the departmental dose-optimization program which includes automated exposure control, adjustment of the mA and/or kV according to patient size and/or use of iterative reconstruction technique. CONTRAST:  75mL OMNIPAQUE IOHEXOL 350 MG/ML SOLN COMPARISON:  CT angiography abdomen pelvis 03/21/2023 FINDINGS: Lower chest: Moderate volume hiatal hernia. Distal esophageal wall thickening. Hepatobiliary: Nodular hepatic contour. No focal liver abnormality. Calcified gallstones within the gallbladder lumen. No gallbladder wall thickening or pericholecystic fluid. No biliary dilatation. Pancreas: No focal lesion. Normal pancreatic contour. No surrounding inflammatory changes. No main pancreatic ductal dilatation. Spleen: Normal in size without focal abnormality. Adrenals/Urinary Tract: A stable 1.3 x 1 cm left adrenal gland nodule with a density of 45 Hounsfield units. No right adrenal gland nodule. Bilateral kidneys enhance symmetrically. No hydronephrosis. No hydroureter. The urinary bladder is unremarkable. On delayed imaging, there is no urothelial wall thickening and there are no filling defects in the opacified portions of the bilateral collecting systems or ureters. Stomach/Bowel: Stomach is within normal limits. No evidence of bowel wall thickening or dilatation. Appendix appears normal. Vascular/Lymphatic: The hepatic, portal, splenic, superior mesenteric veins are patent. Paraesophageal as well as esophageal varices. No abdominal aorta or iliac aneurysm. At least moderate atherosclerotic plaque of the aorta and its branches. No abdominal, pelvic, or inguinal lymphadenopathy. Reproductive: Status post hysterectomy. No adnexal masses. Other: No intraperitoneal free fluid. No intraperitoneal free gas. No organized fluid collection. Musculoskeletal: No abdominal wall hernia or abnormality. No suspicious lytic or blastic osseous lesions. No acute displaced  fracture. IMPRESSION: 1. Cirrhosis with portal hypertension.  No mass identified. 2. Distal esophageal wall thickening. Correlate with signs and symptoms of esophagitis. Consider direct visualization if clinically indicated. 3. Large volume simple free fluid ascites. 4. Cholelithiasis with no CT finding of acute cholecystitis. 5. Stable indeterminate 1.3 cm left adrenal gland nodule. Recommend follow-up CT or MRI adrenal gland protocol in 1 year to further evaluate. Electronically Signed   By: Tish Frederickson M.D.   On: 06/05/2022 18:29   CT Head  Wo Contrast  Result Date: 06/05/2022 CLINICAL DATA:  Mental status change, unknown cause EXAM: CT HEAD WITHOUT CONTRAST TECHNIQUE: Contiguous axial images were obtained from the base of the skull through the vertex without intravenous contrast. RADIATION DOSE REDUCTION: This exam was performed according to the departmental dose-optimization program which includes automated exposure control, adjustment of the mA and/or kV according to patient size and/or use of iterative reconstruction technique. COMPARISON:  CT head 05/26/22 FINDINGS: Brain: No evidence of acute infarction, hemorrhage, hydrocephalus, extra-axial collection or mass lesion/mass effect. Vascular: No hyperdense vessel or unexpected calcification. Skull: Normal. Negative for fracture or focal lesion. Sinuses/Orbits: No middle ear or mastoid effusion. Paranasal sinuses are clear. Orbits are unremarkable. Other: None. IMPRESSION: No acute intracranial abnormality. Electronically Signed   By: Lorenza Cambridge M.D.   On: 06/05/2022 18:15   DG Chest Port 1 View  Result Date: 06/05/2022 CLINICAL DATA:  Possible sepsis EXAM: PORTABLE CHEST 1 VIEW COMPARISON:  03/19/2022 FINDINGS: Cardiac shadow is stable. Lungs are hypoinflated but clear. Stable scarring in the left base is noted. No bony abnormality noted. IMPRESSION: No active disease. Electronically Signed   By: Alcide Clever M.D.   On: 06/05/2022 16:14     Procedures .Paracentesis  Date/Time: 06/06/2022 11:41 AM  Performed by: Gareth Eagle, PA-C Authorized by: Gareth Eagle, PA-C   Consent:    Consent obtained:  Emergent situation Universal protocol:    Procedure explained and questions answered to patient or proxy's satisfaction: yes     Relevant documents present and verified: yes     Patient identity confirmed:  Arm band Pre-procedure details:    Procedure purpose:  Diagnostic   Preparation: Patient was prepped and draped in usual sterile fashion   Anesthesia:    Anesthesia method:  Local infiltration   Local anesthetic:  Lidocaine 1% w/o epi Procedure details:    Needle gauge:  18   Ultrasound guidance: yes     Puncture site:  L lower quadrant   Fluid removed amount:  40 ml   Fluid appearance:  Clear and yellow   Dressing:  4x4 sterile gauze Post-procedure details:    Procedure completion:  Tolerated well, no immediate complications .Critical Care  Performed by: Gareth Eagle, PA-C Authorized by: Gareth Eagle, PA-C   Critical care provider statement:    Critical care time (minutes):  45   Critical care was necessary to treat or prevent imminent or life-threatening deterioration of the following conditions:  Sepsis and hepatic failure (and hypothermia)   Critical care was time spent personally by me on the following activities:  Development of treatment plan with patient or surrogate, discussions with consultants, evaluation of patient's response to treatment, examination of patient, ordering and review of laboratory studies, ordering and review of radiographic studies, ordering and performing treatments and interventions, pulse oximetry, re-evaluation of patient's condition and review of old charts     Medications Ordered in ED Medications  vancomycin (VANCOREADY) IVPB 1250 mg/250 mL (has no administration in time range)  midodrine (PROAMATINE) tablet 15 mg (15 mg Oral Given 06/06/22 0834)  nadolol  (CORGARD) tablet 20 mg (20 mg Oral Given 06/06/22 0843)  pantoprazole (PROTONIX) EC tablet 40 mg (40 mg Oral Given 06/06/22 0835)  rifaximin (XIFAXAN) tablet 550 mg (550 mg Oral Given 06/06/22 0835)  sucralfate (CARAFATE) 1 GM/10ML suspension 1 g (1 g Oral Given 06/06/22 0835)  sulfamethoxazole-trimethoprim (BACTRIM DS) 800-160 MG per tablet 1 tablet (1 tablet Oral Given 06/06/22 0834)  acetaminophen (  TYLENOL) tablet 650 mg (650 mg Oral Given 06/06/22 0426)  lidocaine (LIDODERM) 5 % 1 patch (has no administration in time range)  dextrose 50 % solution 50 mL (50 mLs Intravenous Given 06/05/22 1526)  lidocaine (PF) (XYLOCAINE) 1 % injection 5 mL (5 mLs Other Given 06/05/22 1638)  cefTRIAXone (ROCEPHIN) 1 g in sodium chloride 0.9 % 100 mL IVPB (0 g Intravenous Stopped 06/05/22 1951)  metroNIDAZOLE (FLAGYL) IVPB 500 mg (0 mg Intravenous Stopped 06/05/22 1952)  vancomycin (VANCOREADY) IVPB 1250 mg/250 mL (0 mg Intravenous Stopped 06/05/22 2028)  sodium chloride 0.9 % bolus 1,000 mL (1,000 mLs Intravenous Bolus 06/05/22 1829)  iohexol (OMNIPAQUE) 350 MG/ML injection 75 mL (75 mLs Intravenous Contrast Given 06/05/22 1802)  lactulose (CHRONULAC) 10 GM/15ML solution 20 g (20 g Oral Given 06/05/22 2240)    ED Course/ Medical Decision Making/ A&P Clinical Course as of 06/06/22 1148  Thu Jun 05, 2022  1445 Hypoglycemic.  Treated with 1 amp of D50. [JR]  1622 Temp recheck was 90.1. Treated with warm fluids. Also started broad spectrum antibiotics. Sent thyroid panel. [JR]  1625 Performed paracentesis.  Able to extract 40 mL of fluid.  Appears clear and yellow-tinged.  Sent for culture. [JR]  1626 . [JR]  2124 Lymphs, Fluid: 32 [JR]  2155 Admitting to hospital service for hepatic encephalopathy and persistent hypothermia. Dr. Arlean Hopping is accepting attending. [JR]  2227 Dr. Arlean Hopping reached back out to me and advised to admit to internal medicine since she is technically a bounce back and within the 30-day window for  discharge.  Dr. August Saucer agreed to except her for admission. [JR]    Clinical Course User Index [JR] Gareth Eagle, PA-C                             Medical Decision Making Amount and/or Complexity of Data Reviewed Labs: ordered. Decision-making details documented in ED Course. Radiology: ordered. ECG/medicine tests: ordered.  Risk Prescription drug management. Decision regarding hospitalization.   Initial Impression and Ddx 62 year old ill-appearing female presenting for altered mental status and weakness.  Recent admission to the hospital for hepatic encephalopathy.  Exam notable for rectal temp of 88 degrees, somnolence, confusion, cool and jaundiced skin, distended abdomen. Patient PMH that increases complexity of ED encounter: Cirrhosis and recent history of hepatic encephalopathy  Interpretation of Diagnostics -I independent reviewed and interpreted the labs as followed: Hyperammonemia, Hemoccult positive  - I independently visualized the following imaging with scope of interpretation limited to determining acute life threatening conditions related to emergency care: CT abdomen pelvis, which revealed ascites and distal wall thickening of the esophagus, and cholelithiasis without cholecystitis, CT head without acute abnormality  -I personally reviewed and interpreted EKG which revealed sinus bradycardia  Patient Reassessment and Ultimate Disposition/Management Given somnolence, confusion, elevated ammonia, primarily concerned with likely hepatic encephalopathy.  Treated with lactulose.  Persistent hypothermia prompted further concern for possible sepsis.  Treated with broad-spectrum antibiotics and warm fluids.  Temp improved after treatment.  Paracentesis was well-tolerated, sent samples to the lab for culture, Gram stain and cell count.  Initially consulted ICU, who advised that if the temperature continues to rise, she is not hypotensive and able to protect her airway she is  appropriate to be managed on the hospital service.  Admitted to hospital service with Dr. August Saucer for hepatic encephalopathy and evaluation for possible sepsis.  Patient management required discussion with the following services or consulting groups:  Hospitalist Service and Intensivist Service  Complexity of Problems Addressed Acute complicated illness or Injury  Additional Data Reviewed and Analyzed Further history obtained from: Further history from spouse/family member, Past medical history and medications listed in the EMR, Prior ED visit notes, and Recent discharge summary  Patient Encounter Risk Assessment Consideration of hospitalization         Final Clinical Impression(s) / ED Diagnoses Final diagnoses:  Altered mental status, unspecified altered mental status type  Hypothermia, initial encounter    Rx / DC Orders ED Discharge Orders     None         Gareth Eagle, PA-C 06/06/22 1149    Lonell Grandchild, MD 06/06/22 1346

## 2022-06-05 NOTE — ED Notes (Signed)
Pt is now at this time alert oriented and reacting appropriately

## 2022-06-05 NOTE — H&P (Addendum)
Date: 06/06/2022               Patient Name:  Kathy Little MRN: [id number]  DOB: 07/04/1960 Age / Sex: 62 y.o., female   PCP: Adron Bene, MD (Inactive)         Medical Service: Internal Medicine Teaching Service         Attending Physician: Dr. Mercie Eon, MD    First Contact: Neldon Labella, MD Pager: [phone]  Second Contact: Rudene Christians, DO Pager: Milinda Pointer [phone]       After Hours (After 5p/  First Contact Pager: [phone]  weekends / holidays): Second Contact Pager: [phone]   SUBJECTIVE   Chief Complaint: AMS  History of Present Illness: Kathy Little is a 62 year old female with past medical history of incompletely treated hepatitis C, hepatic cirrhosis, and recurrent vaginal cancer presented to the ED with altered mental status.  Patient states her family thought she was confused this evening and called EMS.  However, she denies any confusion.  She does not remember anything before EMS arrived today.  Endorses some central abdominal pain.  Also endorses nausea and vomiting but denies any signs of blood in vomitus.  Vomited a very small amount that I can see in the bag on bedside table.  Looks like undigested food.  Endorses good fluid intake and nutrition.  Reports blurred vision but states this is because she does not have her glasses.  Endorses good medication compliance including rifaximin.  She states that she started feeling very cold last night.  No environmental cold exposure.  States she was planning to get paracentesis tomorrow.  Denies headaches, fevers, or chills.  Denies any sick contacts.  Denies diarrhea or any signs of melena or hematochezia.  Endorses regular bowel movements including today.  Denies shortness of breath or chest pain.  Denies any numbness, weakness, or gait disturbance.  ED Course: Hypothermic, bradycardic, normotensive.  Ammonia 72.  TSH, free T4 normal.  Leukopenia but without neutropenia.  Ascitic fluid with total nucleated cell count 21,  negative Gram stain, culture pending.  Lactate 1.9.  FOB positive.  Glucose 63.  Chest x-ray normal.  CT head normal.  CTAP with portal hypertension, large volume ascites, 1.3 cm left adrenal nodule that is stable from prior.  Received IV dextrose, lactulose, ceftriaxone IV, metronidazole IV, IV vancomycin, 1 L warm normal saline bolus and Bair hugger placed.  Glucose improved to 98.  Blood cultures pending.  Mentation improved after interventions.  Temperature improved to 92 F.  Bradycardia resolved.  Meds:  Tylenol 1000 mg daily PRN moderate pain, fever, headache Premarin vaginal cream, small amount on finger and insert into vagina 2-3 times weakly Midodrine 15 mg TID Nadolol 20 mg daily Odansetron 4 mg q8h PRN nausea or vomiting Protonix 40 mg BID Rifaximin 550 mg BID Sucralfate 10 mL (1 g) BID Sulfamethoxazole-trimethoprim 800-160 mg daily   Past Medical History:  Diagnosis Date   Atherosclerosis    Cirrhosis (HCC)    Hepatitis C 1997   Hepatitis C    Hiatal hernia    History of brachytherapy 11/28/14, 12/05/14. 12/12/14   proximal vagina 24 gray   Radiation 10/10/2014 through 11/07/201610/04/2014 through 11/13/2014   Pelvis 45 gray    Vaginal cancer [facility])     Past Surgical History:  Procedure Laterality Date   COLPOSCOPY  08/30/14   and vaginal biopsy   ESOPHAGEAL BANDING  03/20/2022   Procedure: ESOPHAGEAL BANDING;  Surgeon: Lemar Lofty., MD;  Location: [facility] ENDOSCOPY;  Service: Gastroenterology;;   ESOPHAGEAL BANDING N/A 04/23/2022   Procedure: ESOPHAGEAL BANDING;  Surgeon: Beverley Fiedler, MD;  Location: [facility] ENDOSCOPY;  Service: Gastroenterology;  Laterality: N/A;   ESOPHAGOGASTRODUODENOSCOPY (EGD) WITH PROPOFOL N/A 03/20/2022   Procedure: ESOPHAGOGASTRODUODENOSCOPY (EGD) WITH PROPOFOL;  Surgeon: Meridee Score Netty Starring., MD;  Location: [facility] ENDOSCOPY;  Service: Gastroenterology;  Laterality: N/A;   ESOPHAGOGASTRODUODENOSCOPY (EGD) WITH PROPOFOL N/A 04/23/2022    Procedure: ESOPHAGOGASTRODUODENOSCOPY (EGD) WITH PROPOFOL;  Surgeon: Beverley Fiedler, MD;  Location: [facility] ENDOSCOPY;  Service: Gastroenterology;  Laterality: N/A;   IR PARACENTESIS  01/22/2022   IR PARACENTESIS  02/21/2022   IR PARACENTESIS  03/21/2022   IR PARACENTESIS  03/24/2022   IR PARACENTESIS  04/03/2022   IR PARACENTESIS  04/18/2022   IR PARACENTESIS  04/25/2022   IR PARACENTESIS  05/02/2022   IR PARACENTESIS  05/09/2022   IR PARACENTESIS  05/14/2022   IR PARACENTESIS  05/22/2022   IR PARACENTESIS  05/30/2022   ROBOTIC ASSISTED LAPAROSCOPIC HYSTERECTOMY AND SALPINGECTOMY  2012   Dr. Karie Soda, [place]    Social:  Lives With: Husband, son, daughter, and grandson Occupation: Used to work at [employer] but lost her job sadly Support: Family Level of Function: Performs ADLs and IADLs. PCP: [facility] Substances: Denies alcohol use.  Endorses 1 pack/day for 20 years but quit several years ago.  Denies current or prior IV drug use.  Family History: Mother died of cancer of unknown type.  Father died of MI.  Has 3 healthy siblings to her knowledge.  Allergies: Allergies as of 06/05/2022 - Review Complete 06/05/2022  Allergen Reaction Noted   Ampicillin Nausea And Vomiting 03/25/2022   Flagyl [metronidazole] Nausea Only 09/13/2014    Review of Systems: A complete ROS was negative except as per HPI.   OBJECTIVE:   Physical Exam: Blood pressure 103/78, pulse (!) 55, temperature (!) 92.6 F (33.7 C), temperature source Rectal, resp. rate 13, height 5\' 7"  (1.702 m), weight 63.5 kg, SpO2 96 %.  Constitutional: Chronically ill-appearing female sitting in hospital bed with bear hugger, in no acute distress, cachectic HENT: normocephalic atraumatic, mucous membranes moist Eyes: conjunctiva non-erythematous, scleral icterus Neck: supple Cardiovascular: regular rate and rhythm, no m/r/g Pulmonary/Chest: normal work of breathing on room air, lungs clear to auscultation bilaterally Abdominal: soft, distended,  tenderness to palpation centrally, bandage over left lower quadrant MSK: normal bulk and tone, trace pitting edema bilateral lower extremities Neurological: alert & oriented x 3, nondysarthric, positive asterixis Skin: warm and dry, jaundice Psych: Normal mood and affect  Labs: CBC    Component Value Date/Time   WBC 3.8 (L) 06/05/2022 1445   RBC 4.27 06/05/2022 1445   HGB 13.3 06/05/2022 1445   HGB 15.2 10/30/2014 1016   HCT 40.0 06/05/2022 1445   HCT 44.1 10/30/2014 1016   PLT 84 (L) 06/05/2022 1445   PLT 79 (L) 10/30/2014 1016   MCV 93.7 06/05/2022 1445   MCV 94.1 10/30/2014 1016   MCH 31.1 06/05/2022 1445   MCHC 33.3 06/05/2022 1445   RDW 22.5 (H) 06/05/2022 1445   RDW 12.0 10/30/2014 1016   LYMPHSABS 1.1 06/05/2022 1445   LYMPHSABS 0.6 (L) 10/30/2014 1016   MONOABS 0.2 06/05/2022 1445   MONOABS 0.3 10/30/2014 1016   EOSABS 0.0 06/05/2022 1445   EOSABS 0.5 10/30/2014 1016   BASOSABS 0.0 06/05/2022 1445   BASOSABS 0.0 10/30/2014 1016     CMP     Component Value Date/Time   NA 125 (L) 06/05/2022 1445   NA  125 (L) 04/22/2022 1035   NA 141 10/30/2014 1016   K 4.4 06/05/2022 1445   K 4.3 10/30/2014 1016   CL 99 06/05/2022 1445   CO2 20 (L) 06/05/2022 1445   CO2 29 10/30/2014 1016   GLUCOSE 64 (L) 06/05/2022 1445   GLUCOSE 81 10/30/2014 1016   BUN 15 06/05/2022 1445   BUN 12 04/22/2022 1035   BUN 14.4 10/30/2014 1016   CREATININE 0.86 06/05/2022 1445   CREATININE 0.8 10/30/2014 1016   CALCIUM 8.4 (L) 06/05/2022 1445   CALCIUM 9.6 10/30/2014 1016   PROT 7.0 06/05/2022 1445   PROT 6.7 04/22/2022 1035   PROT 7.4 10/30/2014 1016   ALBUMIN 2.9 (L) 06/05/2022 1445   ALBUMIN 3.3 (L) 04/22/2022 1035   ALBUMIN 3.7 10/30/2014 1016   AST 89 (H) 06/05/2022 1445   AST 91 (H) 10/30/2014 1016   ALT 48 (H) 06/05/2022 1445   ALT 103 (H) 10/30/2014 1016   ALKPHOS 194 (H) 06/05/2022 1445   ALKPHOS 129 10/30/2014 1016   BILITOT 5.6 (H) 06/05/2022 1445   BILITOT 2.9 (H)  04/22/2022 1035   BILITOT 0.82 10/30/2014 1016   GFRNONAA >60 06/05/2022 1445    Imaging: CT ABDOMEN PELVIS W CONTRAST  Result Date: 06/05/2022 CLINICAL DATA:  Abdominal pain, acute, nonlocalized. liver failure with ascites c/o of abdominal pain with c/o weakness EXAM: CT ABDOMEN AND PELVIS WITH CONTRAST TECHNIQUE: Multidetector CT imaging of the abdomen and pelvis was performed using the standard protocol following bolus administration of intravenous contrast. RADIATION DOSE REDUCTION: This exam was performed according to the departmental dose-optimization program which includes automated exposure control, adjustment of the mA and/or kV according to patient size and/or use of iterative reconstruction technique. CONTRAST:  75mL OMNIPAQUE IOHEXOL 350 MG/ML SOLN COMPARISON:  CT angiography abdomen pelvis 03/21/2023 FINDINGS: Lower chest: Moderate volume hiatal hernia. Distal esophageal wall thickening. Hepatobiliary: Nodular hepatic contour. No focal liver abnormality. Calcified gallstones within the gallbladder lumen. No gallbladder wall thickening or pericholecystic fluid. No biliary dilatation. Pancreas: No focal lesion. Normal pancreatic contour. No surrounding inflammatory changes. No main pancreatic ductal dilatation. Spleen: Normal in size without focal abnormality. Adrenals/Urinary Tract: A stable 1.3 x 1 cm left adrenal gland nodule with a density of 45 Hounsfield units. No right adrenal gland nodule. Bilateral kidneys enhance symmetrically. No hydronephrosis. No hydroureter. The urinary bladder is unremarkable. On delayed imaging, there is no urothelial wall thickening and there are no filling defects in the opacified portions of the bilateral collecting systems or ureters. Stomach/Bowel: Stomach is within normal limits. No evidence of bowel wall thickening or dilatation. Appendix appears normal. Vascular/Lymphatic: The hepatic, portal, splenic, superior mesenteric veins are patent. Paraesophageal as  well as esophageal varices. No abdominal aorta or iliac aneurysm. At least moderate atherosclerotic plaque of the aorta and its branches. No abdominal, pelvic, or inguinal lymphadenopathy. Reproductive: Status post hysterectomy. No adnexal masses. Other: No intraperitoneal free fluid. No intraperitoneal free gas. No organized fluid collection. Musculoskeletal: No abdominal wall hernia or abnormality. No suspicious lytic or blastic osseous lesions. No acute displaced fracture. IMPRESSION: 1. Cirrhosis with portal hypertension.  No mass identified. 2. Distal esophageal wall thickening. Correlate with signs and symptoms of esophagitis. Consider direct visualization if clinically indicated. 3. Large volume simple free fluid ascites. 4. Cholelithiasis with no CT finding of acute cholecystitis. 5. Stable indeterminate 1.3 cm left adrenal gland nodule. Recommend follow-up CT or MRI adrenal gland protocol in 1 year to further evaluate. Electronically Signed   By: Blanchie Serve  Tessie Fass M.D.   On: 06/05/2022 18:29   CT Head Wo Contrast  Result Date: 06/05/2022 CLINICAL DATA:  Mental status change, unknown cause EXAM: CT HEAD WITHOUT CONTRAST TECHNIQUE: Contiguous axial images were obtained from the base of the skull through the vertex without intravenous contrast. RADIATION DOSE REDUCTION: This exam was performed according to the departmental dose-optimization program which includes automated exposure control, adjustment of the mA and/or kV according to patient size and/or use of iterative reconstruction technique. COMPARISON:  CT head 05/26/22 FINDINGS: Brain: No evidence of acute infarction, hemorrhage, hydrocephalus, extra-axial collection or mass lesion/mass effect. Vascular: No hyperdense vessel or unexpected calcification. Skull: Normal. Negative for fracture or focal lesion. Sinuses/Orbits: No middle ear or mastoid effusion. Paranasal sinuses are clear. Orbits are unremarkable. Other: None. IMPRESSION: No acute  intracranial abnormality. Electronically Signed   By: Lorenza Cambridge M.D.   On: 06/05/2022 18:15   DG Chest Port 1 View  Result Date: 06/05/2022 CLINICAL DATA:  Possible sepsis EXAM: PORTABLE CHEST 1 VIEW COMPARISON:  03/19/2022 FINDINGS: Cardiac shadow is stable. Lungs are hypoinflated but clear. Stable scarring in the left base is noted. No bony abnormality noted. IMPRESSION: No active disease. Electronically Signed   By: Alcide Clever M.D.   On: 06/05/2022 16:14    EKG: personally reviewed my interpretation is sinus bradycardia, prolonged QT, abnormal R wave progression, nonischemic  ASSESSMENT & PLAN:   Assessment & Plan by Problem: Principal Problem:   Acute metabolic encephalopathy Active Problems:   Encephalopathy, metabolic   Devante Tonche is a 62 year old female with past medical history of incompletely treated hepatitis C, hepatic cirrhosis, and recurrent vaginal cancer presented to the ED with altered mental status admitted for hepatic encephalopathy.  # Acute encephalopathy, resolving # Decompensated hepatic cirrhosis # Child-Pugh Class C, MELD-Na 28 # Incompletely treated hepatitis C Patient presents with history of decompensated cirrhosis likely secondary to hepatitis C.  Endorses good compliance with rifaximin.  Paracentesis this admission not consistent with SBP.  Ammonia elevated to 72.  Nonfocal neurologic exam, only with asterixis.  No significant electrolyte abnormalities aside from chronic hyponatremia which is actually mildly improved.  Hypoglycemia was corrected and encephalopathy persisted.  Received lactulose and mental status improved.  Low suspicion for intoxication, ACS, stroke.  Head CT normal.  CXR normal.  UA with leukocytes, negative nitrite and bacteria, large squam, likely contaminant.  Doubt meningitis/encephalitis given lack of headaches or meningeal signs, no leukocytosis and normal lactate.  CTAP with significant ascites and portal hypertension.  Given  symptomology, lab work, and response to lactulose feel like presentation was result of hepatic encephalopathy.  INR elevated, hypoalbuminemia, thrombocytopenia.  Neuroexam much improved by the time I saw her this evening.  Hypothermia MS resolved.  Critical care was consulted but did not feel admission to ICU was warranted given her temperature was responding to Bair hugger and warm IV fluids. -IR consulted for large volume paracentesis as this was already scheduled -start home rifaximin -continued home bactrim  -continued home midodrine -continued home sucralfate -fractionated bilirubin pending -Bair hugger as needed -Daily CMP  # Hypothermia, resolving # Bradycardia, resolved # History of SBP Patient initially met SIRS criteria.  SBP unlikely given paracentesis labs.  Leukopenia but normal lactate.  Patient was placed on ceftriaxone, metronidazole, and vancomycin by ED provider.  Doubt pneumonia, UTI, or meningitis for reasons stated above.  Her hypothermia and bradycardia are improving with bair hugger's and warm IVF.  My suspicion is that her decompensated cirrhosis led to hypothermia  and subsequent bradycardia.  Her vitals are much improved.  Initially with QTc prolongation but this has resolved. -Follow-up blood cultures -Telemetry -Day team can assess for appropriateness of continuing antibiotics tomorrow -Trend temperature -Trend CBC  # Chronic hyponatremia Likely related to cirrhosis and portal hypertension.  Sodium was 126 9 days ago, 128 this admission.  Encephalopathy has improved with today's interventions. -Trend CMP  # Hypoglycemia Patient initially hypoglycemic likely secondary to decreased p.o. intake.  This is improved to normal range with IV dextrose. -Diet ordered -POC CBG  # History of vaginal cancer Follows with OB/GYN.  Recent Pap smear with ASCUS, OB/GYN aware.  Currently on Premarin cream for vaginal atrophy. -Continue Premarin cream  # Hx of ruptured  esophageal varices Clipped in past.  Low suspicion for active bleed.  Hgb normal.  BUN normal.  Normotensive.  Stool guaiac positive.  No frank blood. Diarrhea but denies melena or hematochezia. Vomiting but without coffee-ground or bright red blood. Has had intermittent vomiting for several weeks, likely related to rapidly accumulating ascites.  -continue home nadolol -continue home protonix  Diet: Normal VTE: SCDs IVF: None,None Code: Full  Prior to Admission Living Arrangement: Home, living with husband and children Anticipated Discharge Location: Home Barriers to Discharge: Medical management  Dispo: Admit patient to Observation with expected length of stay less than 2 midnights.  Signed: Adron Bene, MD Internal Medicine Resident PGY-1  06/06/2022, 12:33 AM

## 2022-06-05 NOTE — ED Triage Notes (Signed)
Coming from home liver failure with ascites c/o of abdominal pain with c/o fo weakness baseline per EMS per Family alert to name and place.

## 2022-06-05 NOTE — ED Notes (Signed)
Abdominal fluids sent to lab.  Fluid walked down by NT.  Upon returning to floor the fluid did not have a cap on it, lab tech lisa called to report a safety zone would be placed for this.  NT did not intentionally walk to lab without a cap and I was tied up in multiple rooms and was not aware MD did not cap it was just asked to send

## 2022-06-05 NOTE — Telephone Encounter (Signed)
Pyrtle pt calling stating she has more fluid in her belly and is uncomfortable, she is requesting a paracentesis. Pt had one on 5/24. Pt has Cirrhosis with ascites. Please advise as DOD.

## 2022-06-05 NOTE — ED Notes (Signed)
Pt placed on bear hugger.  

## 2022-06-05 NOTE — Telephone Encounter (Signed)
Attempted to call pt with appt date and time, received message that voicemail is full and unable to leave a message. Pt sent mychart message with appt details.

## 2022-06-05 NOTE — Telephone Encounter (Signed)
Schedule large volume paracentesis with IV albumin and send fluid for cell counts. Further plans per Dr. Rhea Belton.

## 2022-06-05 NOTE — Telephone Encounter (Signed)
Pt scheduled for IR para at [facility] 06/06/22 at 1pm, pt to arrive there at 12:30pm and go in through entrance A.

## 2022-06-05 NOTE — Telephone Encounter (Signed)
Pt calling wanting to schedule another paracentesis. Please advise

## 2022-06-05 NOTE — ED Notes (Signed)
NS bolus with warm fluids and bear hugger placed back on pt

## 2022-06-06 ENCOUNTER — Other Ambulatory Visit (HOSPITAL_COMMUNITY): Payer: No Typology Code available for payment source

## 2022-06-06 ENCOUNTER — Observation Stay (HOSPITAL_COMMUNITY): Payer: No Typology Code available for payment source

## 2022-06-06 ENCOUNTER — Encounter (HOSPITAL_COMMUNITY): Payer: Self-pay | Admitting: Internal Medicine

## 2022-06-06 DIAGNOSIS — K7460 Unspecified cirrhosis of liver: Secondary | ICD-10-CM | POA: Diagnosis not present

## 2022-06-06 DIAGNOSIS — G9341 Metabolic encephalopathy: Secondary | ICD-10-CM | POA: Diagnosis present

## 2022-06-06 DIAGNOSIS — T68XXXA Hypothermia, initial encounter: Secondary | ICD-10-CM | POA: Diagnosis not present

## 2022-06-06 DIAGNOSIS — K7290 Hepatic failure, unspecified without coma: Secondary | ICD-10-CM

## 2022-06-06 HISTORY — PX: IR PARACENTESIS: IMG2679

## 2022-06-06 LAB — COMPREHENSIVE METABOLIC PANEL
ALT: 46 U/L — ABNORMAL HIGH (ref 0–44)
AST: 82 U/L — ABNORMAL HIGH (ref 15–41)
Albumin: 2.5 g/dL — ABNORMAL LOW (ref 3.5–5.0)
Alkaline Phosphatase: 186 U/L — ABNORMAL HIGH (ref 38–126)
Anion gap: 9 (ref 5–15)
BUN: 14 mg/dL (ref 8–23)
CO2: 18 mmol/L — ABNORMAL LOW (ref 22–32)
Calcium: 8.2 mg/dL — ABNORMAL LOW (ref 8.9–10.3)
Chloride: 99 mmol/L (ref 98–111)
Creatinine, Ser: 0.94 mg/dL (ref 0.44–1.00)
GFR, Estimated: >60 mL/min (ref >60)
Glucose, Bld: 164 mg/dL — ABNORMAL HIGH (ref 70–99)
Potassium: 3.8 mmol/L (ref 3.5–5.1)
Sodium: 126 mmol/L — ABNORMAL LOW (ref 135–145)
Total Bilirubin: 4.6 mg/dL — ABNORMAL HIGH (ref 0.3–1.2)
Total Protein: 6.1 g/dL — ABNORMAL LOW (ref 6.5–8.1)

## 2022-06-06 LAB — APTT: aPTT: 45 seconds — ABNORMAL HIGH (ref 24–36)

## 2022-06-06 LAB — LACTIC ACID, PLASMA
Lactic Acid, Venous: 3.6 mmol/L (ref 0.5–1.9)
Lactic Acid, Venous: 2.5 mmol/L (ref 0.5–1.9)
Lactic Acid, Venous: 3 mmol/L (ref 0.5–1.9)
Lactic Acid, Venous: 2.9 mmol/L (ref 0.5–1.9)
Lactic Acid, Venous: 2.8 mmol/L (ref 0.5–1.9)
Lactic Acid, Venous: 2.6 mmol/L (ref 0.5–1.9)

## 2022-06-06 LAB — MISC LABCORP TEST (SEND OUT): Labcorp test code: 144000

## 2022-06-06 LAB — CBC
HCT: 37.5 % (ref 36.0–46.0)
Hemoglobin: 12.4 g/dL (ref 12.0–15.0)
MCH: 31.6 pg (ref 26.0–34.0)
MCHC: 33.1 g/dL (ref 30.0–36.0)
MCV: 95.4 fL (ref 80.0–100.0)
Platelets: 86 10*3/uL — ABNORMAL LOW (ref 150–400)
RBC: 3.93 MIL/uL (ref 3.87–5.11)
RDW: 22.4 % — ABNORMAL HIGH (ref 11.5–15.5)
WBC: 2.6 10*3/uL — ABNORMAL LOW (ref 4.0–10.5)
nRBC: 0 % (ref 0.0–0.2)

## 2022-06-06 LAB — GLUCOSE, CAPILLARY: Glucose-Capillary: 116 mg/dL — ABNORMAL HIGH (ref 70–99)

## 2022-06-06 LAB — CULTURE, BODY FLUID W GRAM STAIN -BOTTLE

## 2022-06-06 LAB — PROTIME-INR
INR: 1.8 — ABNORMAL HIGH (ref 0.8–1.2)
Prothrombin Time: 21.4 seconds — ABNORMAL HIGH (ref 11.4–15.2)

## 2022-06-06 MED ORDER — LACTATED RINGERS IV BOLUS
500.0000 mL | Freq: Once | INTRAVENOUS | Status: AC
  Administered 2022-06-06: 500 mL via INTRAVENOUS

## 2022-06-06 MED ORDER — NADOLOL 20 MG PO TABS
20.0000 mg | ORAL_TABLET | Freq: Every day | ORAL | Status: DC
  Administered 2022-06-06 – 2022-06-08 (×2): 20 mg via ORAL
  Filled 2022-06-06 (×3): qty 1

## 2022-06-06 MED ORDER — RIFAXIMIN 550 MG PO TABS
550.0000 mg | ORAL_TABLET | Freq: Two times a day (BID) | ORAL | Status: DC
  Administered 2022-06-06 – 2022-06-08 (×5): 550 mg via ORAL
  Filled 2022-06-06 (×5): qty 1

## 2022-06-06 MED ORDER — PANTOPRAZOLE SODIUM 40 MG PO TBEC
40.0000 mg | DELAYED_RELEASE_TABLET | Freq: Two times a day (BID) | ORAL | Status: DC
  Administered 2022-06-06 – 2022-06-08 (×5): 40 mg via ORAL
  Filled 2022-06-06 (×5): qty 1

## 2022-06-06 MED ORDER — MIDODRINE HCL 5 MG PO TABS
15.0000 mg | ORAL_TABLET | Freq: Three times a day (TID) | ORAL | Status: DC
  Administered 2022-06-06 (×3): 15 mg via ORAL
  Filled 2022-06-06 (×3): qty 3

## 2022-06-06 MED ORDER — LIDOCAINE HCL 1 % IJ SOLN
INTRAMUSCULAR | Status: AC
  Filled 2022-06-06: qty 20

## 2022-06-06 MED ORDER — LACTATED RINGERS IV BOLUS: 500.0000 mL | Freq: Once | INTRAVENOUS | Status: DC

## 2022-06-06 MED ORDER — ALBUMIN HUMAN 25 % IV SOLN
75.0000 g | Freq: Once | INTRAVENOUS | Status: AC
  Administered 2022-06-06: 75 g via INTRAVENOUS
  Filled 2022-06-06: qty 300

## 2022-06-06 MED ORDER — SULFAMETHOXAZOLE-TRIMETHOPRIM 800-160 MG PO TABS
1.0000 | ORAL_TABLET | Freq: Every day | ORAL | Status: DC
  Administered 2022-06-06 – 2022-06-08 (×3): 1 via ORAL
  Filled 2022-06-06 (×3): qty 1

## 2022-06-06 MED ORDER — LIDOCAINE 5 % EX PTCH
1.0000 | MEDICATED_PATCH | CUTANEOUS | Status: DC
  Administered 2022-06-06 – 2022-06-08 (×3): 1 via TRANSDERMAL
  Filled 2022-06-06 (×3): qty 1

## 2022-06-06 MED ORDER — ACETAMINOPHEN 325 MG PO TABS
650.0000 mg | ORAL_TABLET | Freq: Four times a day (QID) | ORAL | Status: DC | PRN
  Administered 2022-06-06 – 2022-06-07 (×3): 650 mg via ORAL
  Filled 2022-06-06 (×2): qty 2

## 2022-06-06 MED ORDER — SUCRALFATE 1 GM/10ML PO SUSP
1.0000 g | Freq: Two times a day (BID) | ORAL | Status: DC
  Administered 2022-06-06 – 2022-06-08 (×5): 1 g via ORAL
  Filled 2022-06-06 (×6): qty 10

## 2022-06-06 NOTE — Procedures (Signed)
PROCEDURE SUMMARY:  Successful ultrasound guided paracentesis from the right  lower quadrant.  Yielded 11 Liters of straw colored fluid.  No immediate complications.  The patient tolerated the procedure well.   Specimen was sent for labs.  EBL < 2mL  The patient has previously been formally evaluated by the [facility] and is being actively followed for potential future intervention.Patient undergoing work up for liver transplant. Please see note by Dr. Elby Showers 04/25/22

## 2022-06-06 NOTE — ED Notes (Signed)
Delay in transport upstairs, providers at bedside

## 2022-06-06 NOTE — Progress Notes (Signed)
Subjective:   Summary: Kathy Little is a 62 y.o. year old female currently admitted on the IMTS HD#1 for ascites, encephalopathy.  Overnight Events: Admitted overnight, diagnostic paracentesis yesterday.  See initial H&P for full details on presentation and stabilization.  Her temperature normalized overnight with Bair hugger and warm fluids.  Bradycardia has resolved.  Mentation is slightly better.  She received large-volume paracentesis (11 L) this morning with IR.  She feels okay this morning.  Has some pain over her left shoulder and lower back.  No pain over paracentesis site.  She feels slightly less confused, but says she is not completely back to normal.  No abdominal pain, nausea or vomiting, diarrhea, hematemesis, melena.  Tolerating little bit of p.o. intake and has an appetite.  She confirms that she follows with [facility] transplant.  Unsure exactly where she is in the process but has a lot left to do.  She says that she was previously on Lasix and spironolactone but her doctors took her off for some reason it is unclear.  She did not tolerate lactulose.  Several doctors have told her that TIPS is not a good idea for her.  Family supports her with some of her health care measures and she is okay with [place] talking to them  Called her [name] to discuss her current status.  He says that she has had more and more complications from cirrhosis.  He does confirm that she is on transplant wait list but is unclear exactly where she is on the list.  She has an appointment coming up in mid to late June with [facility] transplant folks for follow-up.  There is some concern for missed medication doses.  He asked about resources at home as the family has been unable to completely provide the level of care that she needs.  Also mentions hospice and palliative measures, though he was unclear on exactly what this entails.  We had a long discussion on her current status.  I think with her worsening  late stage cirrhosis and more frequent presentations, her prognosis is poor.  She also has limited options given that she has previously failed trials of diuretics, lactulose, is not a candidate for TIPS, etc.  Unclear on her potential for transplant, will defer that assessment to [facility] hepatology.  I think it is worth it for the patient and her family to discuss long-term goals of care.  Would also be a good idea to point someone as POA.  Her spouse agreed and would like to speak to the palliative care team.  Objective:  Vital signs in last 24 hours: Vitals:   06/06/22 0834 06/06/22 0900 06/06/22 0930 06/06/22 1100  BP:  112/66 127/74 (!) 121/53  Pulse: 72 78 76 68  Resp: 16 16 16 15   Temp:   98 F (36.7 C) 98.3 F (36.8 C)  TempSrc:   Oral Oral  SpO2: 100% 100% 100% 100%  Weight:      Height:       Supplemental O2: Room Air SpO2: 100 %   Physical Exam:  Constitutional: Chronically ill-appearing, cachectic HEENT: Scleral icterus present Cardiovascular: RRR, no murmurs, rubs or gallops Pulmonary/Chest: normal work of breathing on room air, lungs clear to auscultation bilaterally Abdominal: soft, non-tender, slightly distended distended, dressings over left and right lower quadrants from recent paracenteses MSK: Reduced, abrasion over left shoulder Skin: warm and dry, jaundice, bruising  present Extremities: upper/lower extremity pulses 2+, no lower extremity edema present Neuro: Alert and oriented x 3, asterixis present, mildly confused about context, memory  Filed Weights   06/05/22 1601  Weight: 63.5 kg     Intake/Output Summary (Last 24 hours) at 06/06/2022 1436 Last data filed at 06/06/2022 0043 Gross per 24 hour  Intake --  Output 200 ml  Net -200 ml   Net IO Since Admission: -200 mL [06/06/22 1436]  Pertinent Labs:    Latest Ref Rng & Units 06/06/2022    1:30 AM 06/05/2022    2:45 PM 06/05/2022    2:30 PM  CBC  WBC 4.0 - 10.5 K/uL 2.6  3.8    Hemoglobin 12.0 -  15.0 g/dL 16.1  09.6  04.5   Hematocrit 36.0 - 46.0 % 37.5  40.0  43.0   Platelets 150 - 400 K/uL 86  84         Latest Ref Rng & Units 06/06/2022    1:30 AM 06/05/2022    2:45 PM 06/05/2022    2:30 PM  CMP  Glucose 70 - 99 mg/dL 409  64  63   BUN 8 - 23 mg/dL 14  15  17    Creatinine 0.44 - 1.00 mg/dL 8.11  9.14  7.82   Sodium 135 - 145 mmol/L 126  125  128   Potassium 3.5 - 5.1 mmol/L 3.8  4.4  4.5   Chloride 98 - 111 mmol/L 99  99  98   CO2 22 - 32 mmol/L 18  20    Calcium 8.9 - 10.3 mg/dL 8.2  8.4    Total Protein 6.5 - 8.1 g/dL 6.1  7.0    Total Bilirubin 0.3 - 1.2 mg/dL 4.6  5.6    Alkaline Phos 38 - 126 U/L 186  194    AST 15 - 41 U/L 82  89    ALT 0 - 44 U/L 46  48       Imaging: IR Paracentesis  Result Date: 06/06/2022 INDICATION: 62 year old female. History of hepatitis-C, cirrhosis with recurrent ascites. Request is for therapeutic and diagnostic paracentesis EXAM: ULTRASOUND GUIDED THERAPEUTIC AND DIAGNOSTIC PARACENTESIS MEDICATIONS: Lidocaine 1% 10 mL COMPLICATIONS: None immediate. PROCEDURE: Informed written consent was obtained from the patient after a discussion of the risks, benefits and alternatives to treatment. A timeout was performed prior to the initiation of the procedure. Initial ultrasound scanning demonstrates a large amount of ascites within the right lower abdominal quadrant. The right lower abdomen was prepped and draped in the usual sterile fashion. 1% lidocaine was used for local anesthesia. Following this, a 19 gauge, 7-cm, Yueh catheter was introduced. An ultrasound image was saved for documentation purposes. The paracentesis was performed. The catheter was removed and a dressing was applied. The patient tolerated the procedure well without immediate post procedural complication. FINDINGS: A total of approximately 11 L of straw-colored fluid was removed. Samples were sent to the laboratory as requested by the clinical team. IMPRESSION: Successful  ultrasound-guided therapeutic and diagnostic paracentesis yielding 11 liters of peritoneal fluid. Performed by: Anders Grant, NP PLAN: The patient has previously been formally evaluated by the [facility] and is being actively followed for potential future intervention. Patient currently being worked up for possible liver transplant at outside hospital. Please see note from IR attending Dr. Domingo Dimes subtle from April 25, 2022 Electronically Signed   By: Judie Petit.  Shick M.D.   On: 06/06/2022 12:30   CT  ABDOMEN PELVIS W CONTRAST  Result Date: 06/05/2022 CLINICAL DATA:  Abdominal pain, acute, nonlocalized. liver failure with ascites c/o of abdominal pain with c/o weakness EXAM: CT ABDOMEN AND PELVIS WITH CONTRAST TECHNIQUE: Multidetector CT imaging of the abdomen and pelvis was performed using the standard protocol following bolus administration of intravenous contrast. RADIATION DOSE REDUCTION: This exam was performed according to the departmental dose-optimization program which includes automated exposure control, adjustment of the mA and/or kV according to patient size and/or use of iterative reconstruction technique. CONTRAST:  75mL OMNIPAQUE IOHEXOL 350 MG/ML SOLN COMPARISON:  CT angiography abdomen pelvis 03/21/2023 FINDINGS: Lower chest: Moderate volume hiatal hernia. Distal esophageal wall thickening. Hepatobiliary: Nodular hepatic contour. No focal liver abnormality. Calcified gallstones within the gallbladder lumen. No gallbladder wall thickening or pericholecystic fluid. No biliary dilatation. Pancreas: No focal lesion. Normal pancreatic contour. No surrounding inflammatory changes. No main pancreatic ductal dilatation. Spleen: Normal in size without focal abnormality. Adrenals/Urinary Tract: A stable 1.3 x 1 cm left adrenal gland nodule with a density of 45 Hounsfield units. No right adrenal gland nodule. Bilateral kidneys enhance symmetrically. No  hydronephrosis. No hydroureter. The urinary bladder is unremarkable. On delayed imaging, there is no urothelial wall thickening and there are no filling defects in the opacified portions of the bilateral collecting systems or ureters. Stomach/Bowel: Stomach is within normal limits. No evidence of bowel wall thickening or dilatation. Appendix appears normal. Vascular/Lymphatic: The hepatic, portal, splenic, superior mesenteric veins are patent. Paraesophageal as well as esophageal varices. No abdominal aorta or iliac aneurysm. At least moderate atherosclerotic plaque of the aorta and its branches. No abdominal, pelvic, or inguinal lymphadenopathy. Reproductive: Status post hysterectomy. No adnexal masses. Other: No intraperitoneal free fluid. No intraperitoneal free gas. No organized fluid collection. Musculoskeletal: No abdominal wall hernia or abnormality. No suspicious lytic or blastic osseous lesions. No acute displaced fracture. IMPRESSION: 1. Cirrhosis with portal hypertension.  No mass identified. 2. Distal esophageal wall thickening. Correlate with signs and symptoms of esophagitis. Consider direct visualization if clinically indicated. 3. Large volume simple free fluid ascites. 4. Cholelithiasis with no CT finding of acute cholecystitis. 5. Stable indeterminate 1.3 cm left adrenal gland nodule. Recommend follow-up CT or MRI adrenal gland protocol in 1 year to further evaluate. Electronically Signed   By: Tish Frederickson M.D.   On: 06/05/2022 18:29   CT Head Wo Contrast  Result Date: 06/05/2022 CLINICAL DATA:  Mental status change, unknown cause EXAM: CT HEAD WITHOUT CONTRAST TECHNIQUE: Contiguous axial images were obtained from the base of the skull through the vertex without intravenous contrast. RADIATION DOSE REDUCTION: This exam was performed according to the departmental dose-optimization program which includes automated exposure control, adjustment of the mA and/or kV according to patient size  and/or use of iterative reconstruction technique. COMPARISON:  CT head 05/26/22 FINDINGS: Brain: No evidence of acute infarction, hemorrhage, hydrocephalus, extra-axial collection or mass lesion/mass effect. Vascular: No hyperdense vessel or unexpected calcification. Skull: Normal. Negative for fracture or focal lesion. Sinuses/Orbits: No middle ear or mastoid effusion. Paranasal sinuses are clear. Orbits are unremarkable. Other: None. IMPRESSION: No acute intracranial abnormality. Electronically Signed   By: Lorenza Cambridge M.D.   On: 06/05/2022 18:15   DG Chest Port 1 View  Result Date: 06/05/2022 CLINICAL DATA:  Possible sepsis EXAM: PORTABLE CHEST 1 VIEW COMPARISON:  03/19/2022 FINDINGS: Cardiac shadow is stable. Lungs are hypoinflated but clear. Stable scarring in the left base is noted. No bony abnormality noted. IMPRESSION: No active disease. Electronically Signed  By: Alcide Clever M.D.   On: 06/05/2022 16:14     EKG: My EKG interpretation is as follows: NSR, abnormal R wave progression, regular QT QTc and QRS duration Assessment/Plan:   Principal Problem:   Acute metabolic encephalopathy Active Problems:   Encephalopathy, metabolic   Hypothermia   Patient Summary: Kathy Little is a 62 year old female with past medical history of incompletely treated hepatitis C, hepatic cirrhosis, and recurrent vaginal cancer presented to the ED with altered mental status admitted for hepatic encephalopathy.   # Acute encephalopathy, resolving # Decompensated hepatic cirrhosis # Child-Pugh Class C, MELD-Na 28 # Hx of ruptured esophageal varices # Incompletely treated hepatitis C Decompensated cirrhosis with multiple presentations for complications including hepatic encephalopathy, varices and variceal bleeds, ascites with increasingly frequent paracenteses.  Now status post large-volume paracentesis earlier today (11 L out).  No SBP based on diagnostic paracentesis yesterday.  Does have persistent  lactic acidosis between 2.5 and 3.  Encephalopathy is better, but she is still mildly confused and has asterixis.  No concern for variceal bleed during this hospitalization.  Big picture wise, she is undergoing transplant workup with [facility], but given recurrent frequent complications and poor overall prognosis, wonder if she is a good transplant candidate or if she would even be able to tolerate transplant surgery.  Family has requested to discuss her goals of care with hospice/palliative team.  Medically, her treatment options are very limited.  She cannot tolerate lactulose, had worsening hyponatremia with Lasix and spironolactone in the past.  Will try to manage her supportively here and initiate goals of care conversation which can be carried on in the outpatient setting, and also with transplant team input.  Do not think her presentation is consistent with alcoholic hepatitis, do not think she would benefit from prednisone. -Appreciate IR assistance with large-volume paracentesis.  11 L out.  Will give 75 g of albumin. - Also will give small bolus of LR.  Need to continue to monitor lactic acid. -Appreciate palliative assistance with goals of care, advance directives -start home rifaximin.  She has previously not been able to tolerate lactulose consistently. -continued home bactrim for SBP prophylaxis. -continued home midodrine -continued home sucralfate, Protonix -Continuing nadolol 20 mg daily in the hospital, of note with refractory ascites might not be the best choice.  Will defer to outpatient hematology follow-up. - Not starting Lasix or spironolactone given significant hyponatremia with prior use. -Not a candidate for TIPS given recurrent hepatic encephalopathy -fractionated bilirubin pending -Daily CMP   # Hypothermia, resolving # Bradycardia, resolved # Hypoglycemia # History of SBP Initial presentation possibly consistent with sepsis, though no infectious etiology found.  Initial  blood cultures were negative.  She was also hypoglycemic which could explain the hypothermia and bradycardia with some contribution from cirrhosis..  All of the symptoms are resolving.. -Follow-up blood cultures (negative x 24 hours) -Telemetry -No ABX -Trend temperature -Trend CBC -CBG monitoring, D50 as needed   # Chronic hyponatremia Encephalopathy has improved with today's interventions and more likely related to hepatic etiology than hyponatremia.  Overall stable hyponatremia likely indicative of poor prognosis with cirrhosis.  Will not start Lasix or spironolactone at this time. -Trend CMP  # History of vaginal cancer Follows with OB/GYN.  Recent Pap smear with ASCUS, OB/GYN aware.  Currently on Premarin cream for vaginal atrophy. -Continue Premarin cream   Diet: Normal IVF: LR,Bolus VTE: SCDs Code: Full PT/OT recs: Pending, . TOC recs:  Family Update:   Dispo: Anticipated discharge  pending ongoing monitoring, palliative conversation, PT/OT  Lyndle Herrlich, MD PGY-1 Internal Medicine Resident Please contact the on call pager after 5 pm and on weekends at [phone].

## 2022-06-06 NOTE — ED Notes (Signed)
Pt to paracentesis.

## 2022-06-06 NOTE — ED Notes (Signed)
ED TO INPATIENT HANDOFF REPORT  ED Nurse Name and Phone #: Beatris Ship RN [phone]  S Name/Age/Gender Marcy Salvo 62 y.o. female Room/Bed: 008C/008C  Code Status   Code Status: Full Code  Home/SNF/Other Home Patient oriented to: self, place, time, and situation Is this baseline? Yes   Triage Complete: Triage complete  Chief Complaint Acute metabolic encephalopathy [G93.41] Encephalopathy, metabolic [G93.41]  Triage Note Coming from home liver failure with ascites c/o of abdominal pain with c/o fo weakness baseline per EMS per Family alert to name and place.     Allergies Allergies  Allergen Reactions   Ampicillin Nausea And Vomiting   Flagyl [Metronidazole] Nausea Only    Level of Care/Admitting Diagnosis ED Disposition     ED Disposition  Admit   Condition  --   Comment  Hospital Area: [facility] [[id number]]  Level of Care: Progressive [102]  Admit to Progressive based on following criteria: ACUTE MENTAL DISORDER-RELATED Drug/Alcohol Ingestion/Overdose/Withdrawal, Suicidal Ideation/attempt requiring safety sitter and < Q2h monitoring/assessments, moderate to severe agitation that is managed with medication/sitter, CIWA-Ar score < 20.  Admit to Progressive based on following criteria: MULTISYSTEM THREATS such as stable sepsis, metabolic/electrolyte imbalance with or without encephalopathy that is responding to early treatment.  May place patient in observation at [facility] or [facility] if equivalent level of care is available:: No  Covid Evaluation: Asymptomatic - no recent exposure (last 10 days) testing not required  Diagnosis: Encephalopathy, metabolic [960454]  Admitting Physician: Mercie Eon [[id number]]  Attending Physician: Mercie Eon [[id number]]          B Medical/Surgery History Past Medical History:  Diagnosis Date   Atherosclerosis    Cirrhosis (HCC)    Hepatitis C 1997   Hepatitis C    Hiatal hernia    History of  brachytherapy 11/28/14, 12/05/14. 12/12/14   proximal vagina 24 gray   Radiation 10/10/2014 through 11/07/201610/04/2014 through 11/13/2014   Pelvis 45 gray    Vaginal cancer (HCC)    Past Surgical History:  Procedure Laterality Date   COLPOSCOPY  08/30/14   and vaginal biopsy   ESOPHAGEAL BANDING  03/20/2022   Procedure: ESOPHAGEAL BANDING;  Surgeon: Lemar Lofty., MD;  Location: [facility] ENDOSCOPY;  Service: Gastroenterology;;   ESOPHAGEAL BANDING N/A 04/23/2022   Procedure: ESOPHAGEAL BANDING;  Surgeon: Beverley Fiedler, MD;  Location: [facility] ENDOSCOPY;  Service: Gastroenterology;  Laterality: N/A;   ESOPHAGOGASTRODUODENOSCOPY (EGD) WITH PROPOFOL N/A 03/20/2022   Procedure: ESOPHAGOGASTRODUODENOSCOPY (EGD) WITH PROPOFOL;  Surgeon: Meridee Score Netty Starring., MD;  Location: [facility] ENDOSCOPY;  Service: Gastroenterology;  Laterality: N/A;   ESOPHAGOGASTRODUODENOSCOPY (EGD) WITH PROPOFOL N/A 04/23/2022   Procedure: ESOPHAGOGASTRODUODENOSCOPY (EGD) WITH PROPOFOL;  Surgeon: Beverley Fiedler, MD;  Location: [facility] ENDOSCOPY;  Service: Gastroenterology;  Laterality: N/A;   IR PARACENTESIS  01/22/2022   IR PARACENTESIS  02/21/2022   IR PARACENTESIS  03/21/2022   IR PARACENTESIS  03/24/2022   IR PARACENTESIS  04/03/2022   IR PARACENTESIS  04/18/2022   IR PARACENTESIS  04/25/2022   IR PARACENTESIS  05/02/2022   IR PARACENTESIS  05/09/2022   IR PARACENTESIS  05/14/2022   IR PARACENTESIS  05/22/2022   IR PARACENTESIS  05/30/2022   ROBOTIC ASSISTED LAPAROSCOPIC HYSTERECTOMY AND SALPINGECTOMY  2012   Dr. Karie Soda, [facility]     A IV Location/Drains/Wounds Patient Lines/Drains/Airways Status     Active Line/Drains/Airways     Name Placement date Placement time Site Days   Peripheral IV 06/05/22 20 G Left Antecubital 06/05/22  1409  Antecubital  1   Peripheral IV 06/05/22 22 G Right Forearm 06/05/22  1410  Forearm  1            Intake/Output Last 24 hours  Intake/Output Summary (Last 24 hours) at 06/06/2022 0844 Last data  filed at 06/06/2022 0043 Gross per 24 hour  Intake --  Output 200 ml  Net -200 ml    Labs/Imaging Results for orders placed or performed during the hospital encounter of 06/05/22 (from the past 48 hour(s))  Type and screen [facility]     Status: None   Collection Time: 06/05/22  2:10 PM  Result Value Ref Range   ABO/RH(D) A POS    Antibody Screen NEG    Sample Expiration      06/08/2022,2359 Performed at [facility], [address], [place], [place] [address]   I-Stat Chem 8, ED     Status: Abnormal   Collection Time: 06/05/22  2:30 PM  Result Value Ref Range   Sodium 128 (L) 135 - 145 mmol/L   Potassium 4.5 3.5 - 5.1 mmol/L   Chloride 98 98 - 111 mmol/L   BUN 17 8 - 23 mg/dL   Creatinine, Ser 1.91 0.44 - 1.00 mg/dL   Glucose, Bld 63 (L) 70 - 99 mg/dL    Comment: Glucose reference range applies only to samples taken after fasting for at least 8 hours.   Calcium, Ion 1.07 (L) 1.15 - 1.40 mmol/L   TCO2 20 (L) 22 - 32 mmol/L   Hemoglobin 14.6 12.0 - 15.0 g/dL   HCT 47.8 29.5 - 62.1 %  Salicylate level     Status: Abnormal   Collection Time: 06/05/22  2:44 PM  Result Value Ref Range   Salicylate Lvl <7.0 (L) 7.0 - 30.0 mg/dL    Comment: Performed at [facility], [address] [place], [place] [address]  Acetaminophen level     Status: Abnormal   Collection Time: 06/05/22  2:44 PM  Result Value Ref Range   Acetaminophen (Tylenol), Serum <10 (L) 10 - 30 ug/mL    Comment: (NOTE) Therapeutic concentrations vary significantly. A range of 10-30 ug/mL  may be an effective concentration for many patients. However, some  are best treated at concentrations outside of this range. Acetaminophen concentrations >150 ug/mL at 4 hours after ingestion  and >50 ug/mL at 12 hours after ingestion are often associated with  toxic reactions.  Performed at [facility], [address] [place], [place] [address]   POC occult blood, ED     Status:  Abnormal   Collection Time: 06/05/22  2:44 PM  Result Value Ref Range   Fecal Occult Bld POSITIVE (A) NEGATIVE  Comprehensive metabolic panel     Status: Abnormal   Collection Time: 06/05/22  2:45 PM  Result Value Ref Range   Sodium 125 (L) 135 - 145 mmol/L   Potassium 4.4 3.5 - 5.1 mmol/L   Chloride 99 98 - 111 mmol/L   CO2 20 (L) 22 - 32 mmol/L   Glucose, Bld 64 (L) 70 - 99 mg/dL    Comment: Glucose reference range applies only to samples taken after fasting for at least 8 hours.   BUN 15 8 - 23 mg/dL   Creatinine, Ser 6.29 0.44 - 1.00 mg/dL   Calcium 8.4 (L) 8.9 - 10.3 mg/dL   Total Protein 7.0 6.5 - 8.1 g/dL   Albumin 2.9 (L) 3.5 - 5.0  g/dL   AST 89 (H) 15 - 41 U/L   ALT 48 (H) 0 - 44 U/L   Alkaline Phosphatase 194 (H) 38 - 126 U/L   Total Bilirubin 5.6 (H) 0.3 - 1.2 mg/dL   GFR, Estimated >16 >10 mL/min    Comment: (NOTE) Calculated using the CKD-EPI Creatinine Equation (2021)    Anion gap 6 5 - 15    Comment: Performed at [facility], [address] [place], [place] [address]  CBC with Differential     Status: Abnormal   Collection Time: 06/05/22  2:45 PM  Result Value Ref Range   WBC 3.8 (L) 4.0 - 10.5 K/uL   RBC 4.27 3.87 - 5.11 MIL/uL   Hemoglobin 13.3 12.0 - 15.0 g/dL   HCT 40.9 81.1 - 91.4 %   MCV 93.7 80.0 - 100.0 fL   MCH 31.1 26.0 - 34.0 pg   MCHC 33.3 30.0 - 36.0 g/dL   RDW 78.2 (H) 95.6 - 21.3 %   Platelets 84 (L) 150 - 400 K/uL    Comment: Immature Platelet Fraction may be clinically indicated, consider ordering this additional test [id number] REPEATED TO VERIFY    nRBC 0.0 0.0 - 0.2 %   Neutrophils Relative % 66 %   Neutro Abs 2.5 1.7 - 7.7 K/uL   Lymphocytes Relative 28 %   Lymphs Abs 1.1 0.7 - 4.0 K/uL   Monocytes Relative 6 %   Monocytes Absolute 0.2 0.1 - 1.0 K/uL   Eosinophils Relative 0 %   Eosinophils Absolute 0.0 0.0 - 0.5 K/uL   Basophils Relative 0 %   Basophils Absolute 0.0 0.0 - 0.1 K/uL   WBC Morphology MORPHOLOGY  UNREMARKABLE    RBC Morphology MORPHOLOGY UNREMARKABLE     Comment: CORRECTED ON 05/30 AT 1559: PREVIOUSLY REPORTED AS See Note   Smear Review MORPHOLOGY UNREMARKABLE    Abs Immature Granulocytes 0.00 0.00 - 0.07 K/uL    Comment: Performed at [facility], [address], [place], [place] [address]  Protime-INR     Status: Abnormal   Collection Time: 06/05/22  2:45 PM  Result Value Ref Range   Prothrombin Time 20.6 (H) 11.4 - 15.2 seconds   INR 1.7 (H) 0.8 - 1.2    Comment: (NOTE) INR goal varies based on device and disease states. Performed at [facility], [address], [place], [place] [address]   APTT     Status: Abnormal   Collection Time: 06/05/22  2:45 PM  Result Value Ref Range   aPTT 45 (H) 24 - 36 seconds    Comment:        IF BASELINE aPTT IS ELEVATED, SUGGEST PATIENT RISK ASSESSMENT BE USED TO DETERMINE APPROPRIATE ANTICOAGULANT THERAPY. Performed at [facility], [address], [place], [place] [address]   Lactic acid, plasma     Status: None   Collection Time: 06/05/22  3:11 PM  Result Value Ref Range   Lactic Acid, Venous 1.9 0.5 - 1.9 mmol/L    Comment: Performed at [facility], [address] [place], [place] [address]  Body fluid cell count with differential     Status: Abnormal   Collection Time: 06/05/22  4:22 PM  Result Value Ref Range   Fluid Type-FCT Peritoneal FLUID NO CYTO     Comment: CORRECTED ON 05/30 AT 1717: PREVIOUSLY REPORTED AS Peritoneal   Color, Fluid YELLOW    Appearance, Fluid HAZY (A) CLEAR  Total Nucleated Cell Count, Fluid 20 0 - 1,000 cu mm   Neutrophil Count, Fluid 16 0 - 25 %   Lymphs, Fluid 32 %   Monocyte-Macrophage-Serous Fluid 52 50 - 90 %   Eos, Fluid 0 %    Comment: Performed at [facility], [address] [place], [place] [address]  Gram stain     Status: None   Collection Time: 06/05/22  4:39 PM   Specimen: Fluid  Result Value Ref Range   Specimen Description FLUID ABDOMEN     Special Requests NONE    Gram Stain      WBC PRESENT, PREDOMINANTLY MONONUCLEAR NO ORGANISMS SEEN CYTOSPIN SMEAR Performed at [facility], [address] [place], [place] [address]    Report Status 06/05/2022 FINAL   Magnesium     Status: None   Collection Time: 06/05/22  5:08 PM  Result Value Ref Range   Magnesium 2.0 1.7 - 2.4 mg/dL    Comment: Performed at [facility], [address] [place], [place] [address]  CBG monitoring, ED (now and then every hour for 3 hours)     Status: None   Collection Time: 06/05/22  7:45 PM  Result Value Ref Range   Glucose-Capillary 98 70 - 99 mg/dL    Comment: Glucose reference range applies only to samples taken after fasting for at least 8 hours.  TSH     Status: None   Collection Time: 06/05/22  7:46 PM  Result Value Ref Range   TSH 2.627 0.350 - 4.500 uIU/mL    Comment: Performed by a 3rd Generation assay with a functional sensitivity of <=0.01 uIU/mL. Performed at [facility], [address] [place], [place] [address]   T4, free     Status: None   Collection Time: 06/05/22  7:46 PM  Result Value Ref Range   Free T4 1.03 0.61 - 1.12 ng/dL    Comment: (NOTE) Biotin ingestion may interfere with free T4 tests. If the results are inconsistent with the TSH level, previous test results, or the clinical presentation, then consider biotin interference. If needed, order repeat testing after stopping biotin. Performed at [facility], [address], [place], [place] [address]   Ammonia     Status: Abnormal   Collection Time: 06/05/22  8:05 PM  Result Value Ref Range   Ammonia 72 (H) 9 - 35 umol/L    Comment: Performed at [facility], [address] [place], [place] [address]  Urinalysis, w/ Reflex to Culture (Infection Suspected) -Urine, Clean Catch     Status: Abnormal   Collection Time: 06/05/22 10:50 PM  Result Value Ref Range   Specimen Source URINE, CLEAN CATCH    Color, Urine AMBER (A) YELLOW     Comment: BIOCHEMICALS MAY BE AFFECTED BY COLOR   APPearance HAZY (A) CLEAR   Specific Gravity, Urine >1.046 (H) 1.005 - 1.030   pH 5.0 5.0 - 8.0   Glucose, UA NEGATIVE NEGATIVE mg/dL   Hgb urine dipstick NEGATIVE NEGATIVE   Bilirubin Urine SMALL (A) NEGATIVE   Ketones, ur NEGATIVE NEGATIVE mg/dL   Protein, ur 30 (A) NEGATIVE mg/dL   Nitrite NEGATIVE NEGATIVE   Leukocytes,Ua MODERATE (A) NEGATIVE   RBC / HPF 0-5 0 - 5 RBC/hpf   WBC, UA 11-20 0 - 5 WBC/hpf    Comment:        Reflex urine culture not performed if WBC <=10, OR if  Squamous epithelial cells >5. If Squamous epithelial cells >5 suggest recollection.    Bacteria, UA NONE SEEN NONE SEEN   Squamous Epithelial / HPF 11-20 0 - 5 /HPF   Mucus PRESENT     Comment: Performed at [facility], [address], [place], [place] [address]  Lactic acid, plasma     Status: Abnormal   Collection Time: 06/06/22  1:30 AM  Result Value Ref Range   Lactic Acid, Venous 3.6 (HH) 0.5 - 1.9 mmol/L    Comment: CRITICAL RESULT CALLED TO, READ BACK BY AND VERIFIED WITH Montclair Sink, RN. [phone] 06/06/22. LPAIT Performed at [facility], [address] [place], [place] [address]   Comprehensive metabolic panel     Status: Abnormal   Collection Time: 06/06/22  1:30 AM  Result Value Ref Range   Sodium 126 (L) 135 - 145 mmol/L   Potassium 3.8 3.5 - 5.1 mmol/L   Chloride 99 98 - 111 mmol/L   CO2 18 (L) 22 - 32 mmol/L   Glucose, Bld 164 (H) 70 - 99 mg/dL    Comment: Glucose reference range applies only to samples taken after fasting for at least 8 hours.   BUN 14 8 - 23 mg/dL   Creatinine, Ser 1.91 0.44 - 1.00 mg/dL   Calcium 8.2 (L) 8.9 - 10.3 mg/dL   Total Protein 6.1 (L) 6.5 - 8.1 g/dL   Albumin 2.5 (L) 3.5 - 5.0 g/dL   AST 82 (H) 15 - 41 U/L   ALT 46 (H) 0 - 44 U/L   Alkaline Phosphatase 186 (H) 38 - 126 U/L   Total Bilirubin 4.6 (H) 0.3 - 1.2 mg/dL   GFR, Estimated >47 >82 mL/min    Comment: (NOTE) Calculated using the CKD-EPI  Creatinine Equation (2021)    Anion gap 9 5 - 15    Comment: Performed at [facility], [address] [place], [place] [address]  CBC     Status: Abnormal   Collection Time: 06/06/22  1:30 AM  Result Value Ref Range   WBC 2.6 (L) 4.0 - 10.5 K/uL   RBC 3.93 3.87 - 5.11 MIL/uL   Hemoglobin 12.4 12.0 - 15.0 g/dL   HCT 30.8 65.7 - 84.6 %   MCV 95.4 80.0 - 100.0 fL   MCH 31.6 26.0 - 34.0 pg   MCHC 33.1 30.0 - 36.0 g/dL   RDW 96.2 (H) 95.2 - 84.1 %   Platelets 86 (L) 150 - 400 K/uL    Comment: Immature Platelet Fraction may be clinically indicated, consider ordering this additional test [id number] REPEATED TO VERIFY    nRBC 0.0 0.0 - 0.2 %    Comment: Performed at [facility], [address] [place], [place] [address]  Protime-INR     Status: Abnormal   Collection Time: 06/06/22  1:30 AM  Result Value Ref Range   Prothrombin Time 21.4 (H) 11.4 - 15.2 seconds   INR 1.8 (H) 0.8 - 1.2    Comment: (NOTE) INR goal varies based on device and disease states. Performed at [facility], [address] [place], [place] [address]   APTT     Status: Abnormal   Collection Time: 06/06/22  1:30 AM  Result Value Ref Range   aPTT 45 (H) 24 - 36 seconds    Comment:        IF BASELINE aPTT IS ELEVATED, SUGGEST PATIENT RISK ASSESSMENT BE USED TO DETERMINE APPROPRIATE  ANTICOAGULANT THERAPY. Performed at [facility], [address] [place], [place] [address]    CT ABDOMEN PELVIS W CONTRAST  Result Date: 06/05/2022 CLINICAL DATA:  Abdominal pain, acute, nonlocalized. liver failure with ascites c/o of abdominal pain with c/o weakness EXAM: CT ABDOMEN AND PELVIS WITH CONTRAST TECHNIQUE: Multidetector CT imaging of the abdomen and pelvis was performed using the standard protocol following bolus administration of intravenous contrast. RADIATION DOSE REDUCTION: This exam was performed according to the departmental dose-optimization program which includes automated exposure  control, adjustment of the mA and/or kV according to patient size and/or use of iterative reconstruction technique. CONTRAST:  75mL OMNIPAQUE IOHEXOL 350 MG/ML SOLN COMPARISON:  CT angiography abdomen pelvis 03/21/2023 FINDINGS: Lower chest: Moderate volume hiatal hernia. Distal esophageal wall thickening. Hepatobiliary: Nodular hepatic contour. No focal liver abnormality. Calcified gallstones within the gallbladder lumen. No gallbladder wall thickening or pericholecystic fluid. No biliary dilatation. Pancreas: No focal lesion. Normal pancreatic contour. No surrounding inflammatory changes. No main pancreatic ductal dilatation. Spleen: Normal in size without focal abnormality. Adrenals/Urinary Tract: A stable 1.3 x 1 cm left adrenal gland nodule with a density of 45 Hounsfield units. No right adrenal gland nodule. Bilateral kidneys enhance symmetrically. No hydronephrosis. No hydroureter. The urinary bladder is unremarkable. On delayed imaging, there is no urothelial wall thickening and there are no filling defects in the opacified portions of the bilateral collecting systems or ureters. Stomach/Bowel: Stomach is within normal limits. No evidence of bowel wall thickening or dilatation. Appendix appears normal. Vascular/Lymphatic: The hepatic, portal, splenic, superior mesenteric veins are patent. Paraesophageal as well as esophageal varices. No abdominal aorta or iliac aneurysm. At least moderate atherosclerotic plaque of the aorta and its branches. No abdominal, pelvic, or inguinal lymphadenopathy. Reproductive: Status post hysterectomy. No adnexal masses. Other: No intraperitoneal free fluid. No intraperitoneal free gas. No organized fluid collection. Musculoskeletal: No abdominal wall hernia or abnormality. No suspicious lytic or blastic osseous lesions. No acute displaced fracture. IMPRESSION: 1. Cirrhosis with portal hypertension.  No mass identified. 2. Distal esophageal wall thickening. Correlate with signs  and symptoms of esophagitis. Consider direct visualization if clinically indicated. 3. Large volume simple free fluid ascites. 4. Cholelithiasis with no CT finding of acute cholecystitis. 5. Stable indeterminate 1.3 cm left adrenal gland nodule. Recommend follow-up CT or MRI adrenal gland protocol in 1 year to further evaluate. Electronically Signed   By: Tish Frederickson M.D.   On: 06/05/2022 18:29   CT Head Wo Contrast  Result Date: 06/05/2022 CLINICAL DATA:  Mental status change, unknown cause EXAM: CT HEAD WITHOUT CONTRAST TECHNIQUE: Contiguous axial images were obtained from the base of the skull through the vertex without intravenous contrast. RADIATION DOSE REDUCTION: This exam was performed according to the departmental dose-optimization program which includes automated exposure control, adjustment of the mA and/or kV according to patient size and/or use of iterative reconstruction technique. COMPARISON:  CT head 05/26/22 FINDINGS: Brain: No evidence of acute infarction, hemorrhage, hydrocephalus, extra-axial collection or mass lesion/mass effect. Vascular: No hyperdense vessel or unexpected calcification. Skull: Normal. Negative for fracture or focal lesion. Sinuses/Orbits: No middle ear or mastoid effusion. Paranasal sinuses are clear. Orbits are unremarkable. Other: None. IMPRESSION: No acute intracranial abnormality. Electronically Signed   By: Lorenza Cambridge M.D.   On: 06/05/2022 18:15   DG Chest Port 1 View  Result Date: 06/05/2022 CLINICAL DATA:  Possible sepsis EXAM: PORTABLE CHEST 1 VIEW COMPARISON:  03/19/2022 FINDINGS: Cardiac shadow is stable. Lungs are hypoinflated but clear. Stable  scarring in the left base is noted. No bony abnormality noted. IMPRESSION: No active disease. Electronically Signed   By: Alcide Clever M.D.   On: 06/05/2022 16:14    Pending Labs Unresulted Labs (From admission, onward)     Start     Ordered   06/07/22 0500  Bilirubin, fractionated(tot/dir/indir)  Tomorrow  morning,   R        06/06/22 0047   06/06/22 0717  Lactic acid, plasma  Once,   R        06/06/22 0716   06/05/22 1639  Culture, body fluid w Gram Stain-bottle  Once,   R        06/05/22 1639   06/05/22 1445  Miscellaneous [facility] test (send-out)  Once,   R        06/05/22 1445   06/05/22 1401  Blood Culture (routine x 2)  (Undifferentiated presentation (screening labs and basic nursing orders))  BLOOD CULTURE X 2,   STAT      06/05/22 1403            Vitals/Pain Today's Vitals   06/06/22 0749 06/06/22 0830 06/06/22 0833 06/06/22 0834  BP: 117/77 108/60    Pulse:    72  Resp:    16  Temp:   97.6 F (36.4 C)   TempSrc:   Oral   SpO2:    100%  Weight:      Height:      PainSc:        Isolation Precautions No active isolations  Medications Medications  vancomycin (VANCOREADY) IVPB 1250 mg/250 mL (has no administration in time range)  midodrine (PROAMATINE) tablet 15 mg (15 mg Oral Given 06/06/22 0834)  nadolol (CORGARD) tablet 20 mg (20 mg Oral Given 06/06/22 0843)  pantoprazole (PROTONIX) EC tablet 40 mg (40 mg Oral Given 06/06/22 0835)  rifaximin (XIFAXAN) tablet 550 mg (550 mg Oral Given 06/06/22 0835)  sucralfate (CARAFATE) 1 GM/10ML suspension 1 g (1 g Oral Given 06/06/22 0835)  sulfamethoxazole-trimethoprim (BACTRIM DS) 800-160 MG per tablet 1 tablet (1 tablet Oral Given 06/06/22 0834)  acetaminophen (TYLENOL) tablet 650 mg (650 mg Oral Given 06/06/22 0426)  dextrose 50 % solution 50 mL (50 mLs Intravenous Given 06/05/22 1526)  lidocaine (PF) (XYLOCAINE) 1 % injection 5 mL (5 mLs Other Given 06/05/22 1638)  cefTRIAXone (ROCEPHIN) 1 g in sodium chloride 0.9 % 100 mL IVPB (0 g Intravenous Stopped 06/05/22 1951)  metroNIDAZOLE (FLAGYL) IVPB 500 mg (0 mg Intravenous Stopped 06/05/22 1952)  vancomycin (VANCOREADY) IVPB 1250 mg/250 mL (0 mg Intravenous Stopped 06/05/22 2028)  sodium chloride 0.9 % bolus 1,000 mL (1,000 mLs Intravenous Bolus 06/05/22 1829)  iohexol (OMNIPAQUE) 350  MG/ML injection 75 mL (75 mLs Intravenous Contrast Given 06/05/22 1802)  lactulose (CHRONULAC) 10 GM/15ML solution 20 g (20 g Oral Given 06/05/22 2240)    Mobility walks     R Recommendations: See Admitting Provider Note  Report given to: [id number]

## 2022-06-06 NOTE — ED Notes (Signed)
Patient transported to have testing done.

## 2022-06-07 DIAGNOSIS — G9341 Metabolic encephalopathy: Secondary | ICD-10-CM

## 2022-06-07 DIAGNOSIS — Z7189 Other specified counseling: Secondary | ICD-10-CM

## 2022-06-07 DIAGNOSIS — Z515 Encounter for palliative care: Secondary | ICD-10-CM

## 2022-06-07 DIAGNOSIS — K7682 Hepatic encephalopathy: Principal | ICD-10-CM

## 2022-06-07 LAB — CBC
HCT: 26.1 % — ABNORMAL LOW (ref 36.0–46.0)
Hemoglobin: 8.9 g/dL — ABNORMAL LOW (ref 12.0–15.0)
MCH: 32.5 pg (ref 26.0–34.0)
MCHC: 34.1 g/dL (ref 30.0–36.0)
MCV: 95.3 fL (ref 80.0–100.0)
Platelets: 48 10*3/uL — ABNORMAL LOW (ref 150–400)
RBC: 2.74 MIL/uL — ABNORMAL LOW (ref 3.87–5.11)
RDW: 22.5 % — ABNORMAL HIGH (ref 11.5–15.5)
WBC: 2.1 10*3/uL — ABNORMAL LOW (ref 4.0–10.5)
nRBC: 0 % (ref 0.0–0.2)

## 2022-06-07 LAB — CULTURE, BODY FLUID W GRAM STAIN -BOTTLE

## 2022-06-07 LAB — COMPREHENSIVE METABOLIC PANEL
ALT: 26 U/L (ref 0–44)
AST: 48 U/L — ABNORMAL HIGH (ref 15–41)
Albumin: 2.5 g/dL — ABNORMAL LOW (ref 3.5–5.0)
Alkaline Phosphatase: 118 U/L (ref 38–126)
Anion gap: 8 (ref 5–15)
BUN: 19 mg/dL (ref 8–23)
CO2: 20 mmol/L — ABNORMAL LOW (ref 22–32)
Calcium: 7.9 mg/dL — ABNORMAL LOW (ref 8.9–10.3)
Chloride: 99 mmol/L (ref 98–111)
Creatinine, Ser: 1.19 mg/dL — ABNORMAL HIGH (ref 0.44–1.00)
GFR, Estimated: 52 mL/min — ABNORMAL LOW (ref >60)
Glucose, Bld: 89 mg/dL (ref 70–99)
Potassium: 4.2 mmol/L (ref 3.5–5.1)
Sodium: 127 mmol/L — ABNORMAL LOW (ref 135–145)
Total Bilirubin: 2.5 mg/dL — ABNORMAL HIGH (ref 0.3–1.2)
Total Protein: 4.5 g/dL — ABNORMAL LOW (ref 6.5–8.1)

## 2022-06-07 LAB — BILIRUBIN, FRACTIONATED(TOT/DIR/INDIR)
Bilirubin, Direct: 1.1 mg/dL — ABNORMAL HIGH (ref 0.0–0.2)
Indirect Bilirubin: 1.5 mg/dL — ABNORMAL HIGH (ref 0.3–0.9)
Total Bilirubin: 2.6 mg/dL — ABNORMAL HIGH (ref 0.3–1.2)

## 2022-06-07 LAB — LACTIC ACID, PLASMA
Lactic Acid, Venous: 2.7 mmol/L (ref 0.5–1.9)
Lactic Acid, Venous: 3 mmol/L (ref 0.5–1.9)
Lactic Acid, Venous: 2.3 mmol/L (ref 0.5–1.9)

## 2022-06-07 LAB — HEMOGLOBIN AND HEMATOCRIT, BLOOD
HCT: 29.6 % — ABNORMAL LOW (ref 36.0–46.0)
Hemoglobin: 10.2 g/dL — ABNORMAL LOW (ref 12.0–15.0)

## 2022-06-07 MED ORDER — LACTATED RINGERS IV BOLUS
500.0000 mL | Freq: Once | INTRAVENOUS | Status: AC
  Administered 2022-06-07: 500 mL via INTRAVENOUS

## 2022-06-07 MED ORDER — ACETAMINOPHEN 10 MG/ML IV SOLN
500.0000 mg | Freq: Once | INTRAVENOUS | Status: AC
  Administered 2022-06-07: 500 mg via INTRAVENOUS
  Filled 2022-06-07: qty 50

## 2022-06-07 MED ORDER — MIDODRINE HCL 5 MG PO TABS
17.5000 mg | ORAL_TABLET | Freq: Three times a day (TID) | ORAL | Status: DC
  Administered 2022-06-07 – 2022-06-08 (×5): 17.5 mg via ORAL
  Filled 2022-06-07 (×5): qty 4

## 2022-06-07 NOTE — Consult Note (Signed)
Palliative Care Consult Note                                  Date: 06/07/2022   Patient Name: Kathy Little  DOB: 10/15/1960  MRN: [id number]  Age / Sex: 62 y.o., female  PCP: Adron Bene, MD (Inactive) Referring Physician: Mercie Eon, MD  Reason for Consultation: Establishing goals of care  HPI/Patient Profile: 62 y.o. female  with past medical history of cirrhosis secondary to incompletely treated hepatitis C (complicated by esophageal varices, large volume ascites, prior SBP, and recurrent hepatic encephalopathy), and recurrent vaginal cancer. She presented to the ED on 06/05/2022 with altered mental status.  In the ED, patient was hypothermic and bradycardic. CT abdomen/pelvis with portal hypertension, large volume ascites, and left adrenal nodule that is stable from prior.  On 5/31, she underwent paracentesis with removal of 11 L of fluid. She is admitted to Internal Medicine service with acute hepatic encephalopathy and decompensated cirrhosis.  Palliative medicine has been consulted for goals of care.   Subjective:   I have reviewed medical records including progress notes, labs and imaging.  Discussed with Dr. Sloan Leiter from [facility]; she reports patient's encephalopathy has improved.  I met with patient at bedside to discuss diagnosis, prognosis, GOC, disposition, and options. I also spoke with her partner/Kathy Little by phone.   I introduced Palliative Medicine as specialized medical care for people living with serious illness. It focuses on providing relief from the symptoms and stress of a serious illness.   We discussed patient's current illness and what it means in the larger context of her ongoing co-morbidities. Current clinical status was reviewed. Natural disease trajectory of advanced cirrhosis was discussed.  Created space and opportunity for patient and family to express thoughts and feelings regarding current medical  situation. Values and goals of care important to patient and family were attempted to be elicited.  A discussion was had today regarding advanced directives. The MOST form was introduced.   Life Review: Kathy Little is originally from [place]. She has been with her partner/Kathy Little for 30+ years. They have 3 children together; daughter/Kathy Little (lives in [place]), daughter/Kathy Little, and [employer].   Functional Status: Kathy Little continues to be ambulatory and independent with ADLs. She continues to be able to cook and do housework, however she reports her activity level is becoming more limited due to fatigue.   Patient/Family Understanding of Illness: Kathy Little and Kathy Little both verbalize understanding of the seriousness of her condition. They understand that cirrhosis is a non-curable and life-limiting illness, unless patient can receive a liver transplant.  Kathy Little shares that he has been told Kathy Little's prognosis may be "2-3 months without a liver".   Goals: Medical stabilization and to return home. Patient is also hopeful to follow-up with [facility] for transplant work-up.   Advanced Directives: Patient names her partner Kathy Little as her healthcare agent and his sister (her sister-in-law) Kathy Little as alternate Patient states her wishes not to have life-prolonging measures in the following situations: She has a condition that cannot be cured and will result in death within a relatively short period of time She becomes unconscious and is not expected to regain consciousness She suffers from advanced dementia or any other condition which resulted in the substantial loss of ability to think  GOC Discussion: I shared my concern with both Kathy Little and Kathy Little that her treatment options seem to be quite limited. She has not  tolerated lactulose, and previously had worsening hyponatremia with Lasix and spironolactone.  Discussed that she is likely not a candidate for TIPS due to recurrent  hepatic encephalopathy.  We did discuss code status. Encouraged consideration of DNR status understanding evidenced based poor outcomes in similar hospitalized patients, as the cause of the arrest is likely associated with chronic/terminal disease rather than a reversible acute cardio-pulmonary event.  Kathy Little and Kathy Little seem to indicate they would agree that DNR is appropriate, however wish to discuss further (in private) prior to making that decision.  Discussed the importance of continued conversation with patient, family, and the medical team regarding overall plan of care and treatment options.    All questions answered.  Emotional support provided.    Review of Systems  Constitutional:  Positive for fatigue.    Objective:   Primary Diagnoses: Present on Admission:  Acute metabolic encephalopathy  Encephalopathy, metabolic   Physical Exam Vitals reviewed.  Constitutional:      General: She is not in acute distress.    Comments: Chronically ill-appearing  Pulmonary:     Effort: Pulmonary effort is normal.  Skin:    Coloration: Skin is jaundiced.  Neurological:     Mental Status: She is alert and oriented to person, place, and time.     Vital Signs:  BP 92/60 (BP Location: Left Arm)   Pulse (!) 51   Temp 97.6 F (36.4 C) (Oral)   Resp 16   Ht 5\' 7"  (1.702 m)   Wt 63.5 kg   SpO2 100%   BMI 21.93 kg/m   Palliative Assessment/Data: PPS 60%     Assessment & Plan:   SUMMARY OF RECOMMENDATIONS   Full code for now - patient and husband both seem indicate that DNR status is appropriate, but wish to discuss further after she is discharged home Advanced Directives Youth worker and living will) have been completed - original given to patient and copy made to scan into EMR Recommend outpatient palliative   Primary Decision Maker: PATIENT  Prognosis:  Unable to determine  Discharge Planning:  Home with Palliative Services     Thank you for allowing [place] to  participate in the care of Kathy Little   Time Total: 95 minutes  Greater than 50%  of this time was spent counseling and coordinating care related to the above assessment and plan.  Signed by: Sherlean Foot, NP Palliative Medicine Team  Team Phone # [phone]  For individual providers, please see AMION

## 2022-06-07 NOTE — Progress Notes (Addendum)
Subjective: Overnight events: Patient was hypotensive with MAPs 58-62. Lactic acid 2.7 -> 3.0 -> 2.3. Received 1.5L bolus of LR.  Patient reports feeling much better this morning. Her abdomen feels significantly less distended after the paracentesis yesterday. She ate almost all of her breakfast. She feels her mentation is back to baseline. She wants to go home. She denies nausea, vomiting, lightheadedness, or dizziness. Discussed plan to monitor vital signs today given hypotension overnight. Discussed need for PT/OT evaluation and palliative consult. All questions and concerns were addressed.   Objective:  Vital signs in last 24 hours: Vitals:   06/07/22 0645 06/07/22 0734 06/07/22 0756 06/07/22 1100  BP: (!) 86/50 (!) 85/61 92/60 (!) 85/60  Pulse: (!) 54 (!) 50 (!) 51 61  Resp: 14 16 16 16   Temp:  97.6 F (36.4 C)  97.6 F (36.4 C)  TempSrc:  Oral  Oral  SpO2:  100%  100%  Weight:      Height:       Physical Exam: Constitutional: Chronically ill-appearing female lying in bed, cachectic. No acute distress. HEENT: Scleral icterus present Cardiovascular: RRR, no murmurs, rubs or gallops Pulmonary/Chest: Normal work of breathing on room air. Lungs clear to auscultation bilaterally. Abdominal: soft, non-tender, slightly distended, dressings over left and right lower quadrants from recent paracenteses MSK: Reduced muscle mass, abrasion over both shoulders Skin: warm and dry, jaundice, bruising present Extremities: no lower extremity edema present Neuro: Alert and oriented x 3, asterixis present, mildly confused about date   Assessment/Plan:  Principal Problem:   Acute metabolic encephalopathy Active Problems:   Encephalopathy, metabolic   Hypothermia   Kathy Little is a 62 year old female with past medical history of incompletely treated hepatitis C, hepatic cirrhosis, and recurrent vaginal cancer who presented to the ED with altered mental status admitted for hepatic  encephalopathy on 06/05/2022.  # Acute encephalopathy, resolving # Decompensated hepatic cirrhosis # Child-Pugh Class C, MELD-Na 28 # Hx of ruptured esophageal varices # Incompletely treated hepatitis C S/p paracentesis on 5/31 with 11 L removed and IV albumin 75 mg received. Hepatic encephalopathy is resolving. Asterixis still present. Patient is now alert and oriented x 3. She remains mildly confused as she thought today was Tuesday instead of Saturday, though she knew the month and year. Patient wants to go home, but she is not stable for discharge today given hypotension and lactic acidosis overnight. This is likely 2/2 to large volume paracentesis 5/31. Low concern for infection. She was given some fluids and increased midodrine to 17.5 mg TID. Will monitor BP response to this dose. Awaiting PT/OT evaluation to determine if she will need follow-up services. Consulted palliative as well to help with GOC and advanced directives. - Appreciate palliative assistance with GOC, advanced directives - PT/OT evaluation - Continue monitoring BP and lactic acid, LR bolus as needed - Increase midodrine 17.5 mg TID - Rifaximin 550 mg BID - Bactrim 800-160 mg daily - Sucralfate 1 g BID - Protonix 40 mg BID - Nadalol 20 mg daily - defer to outpatient hepatology if best choice given refractory ascites - Trend CMP  # Hypothermia, resolved # Bradycardia, resolving # Hypoglycemia, resolved # History of SBP Temperature, heart rate, and glucose remain stable. Blood cultures and peritoneal fluid cultures negative after 2 days. No antibiotics indicated at this time. Hypothermia on presentation likely secondary to hypoglycemia and decompensated cirrhosis. - Telemetry - CBG monitoring, D50 as needed - Trend CBC  # Chronic hyponatremia  Hyponatremia stable at 127,  likely indicative of poor prognosis with cirrhosis. Will not start Lasix or spironolactone at this time. -Trend CMP  # History of vaginal cancer   Follows with OB/GYN.  Recent Pap smear with ASCUS, OB/GYN aware. Currently on Premarin cream for vaginal atrophy. -Continue Premarin cream  Diet: Regular IVF: LR,Bolus VTE: SCDs Code: Full PT/OT recs: Pending  Whitman Hero, Medical Student 06/07/2022, 11:15 AM On call pager: [phone]   Attestation for Student Documentation:  I personally was present and performed or re-performed the history, physical exam and medical decision-making activities of this service and have verified that the service and findings are accurately documented in the student's note.  Mariel Gaudin M. Callista Hoh, D.O.  Internal Medicine Resident, PGY-2 [facility]  Pager: [phone] 2:31 PM, 06/07/2022   **Please contact the on call pager after 5 pm and on weekends at [phone].**

## 2022-06-07 NOTE — Progress Notes (Signed)
Low BP with systolic 80s-90s and HR low 50s Nadolol held per [employer] DO

## 2022-06-07 NOTE — Evaluation (Signed)
Physical Therapy Evaluation Patient Details Name: Kathy Little MRN: [id number] DOB: Mar 17, 1960 Today's Date: 06/07/2022  History of Present Illness  Kathy Little 62 y.o. female who presents from home due to AMS and abdominal pain. Admitted for hypothermia and bradycardia. PMH of cirrhosis 2/2 to untreated HCV, esophageal varices, and ascites  Clinical Impression  PTA, pt reports independence with mobility/ADL's and history of one recent fall. Pt presents with impaired cognition, standing balance, decreased activity tolerance and weakness. Pt ambulating 150 ft with no assistive device at a min guard assist level. Has difficulty with way finding/navigation in hallway and with short term/working memory. Recommend follow up HHPT to address deficits and maximize functional mobility.     Recommendations for follow up therapy are one component of a multi-disciplinary discharge planning process, led by the attending physician.  Recommendations may be updated based on patient status, additional functional criteria and insurance authorization.  Follow Up Recommendations       Assistance Recommended at Discharge Frequent or constant Supervision/Assistance  Patient can return home with the following  A little help with walking and/or transfers;A little help with bathing/dressing/bathroom;Assistance with cooking/housework;Assist for transportation;Help with stairs or ramp for entrance;Direct supervision/assist for medications management;Direct supervision/assist for financial management    Equipment Recommendations None recommended by PT  Recommendations for Other Services       Functional Status Assessment Patient has had a recent decline in their functional status and demonstrates the ability to make significant improvements in function in a reasonable and predictable amount of time.     Precautions / Restrictions Precautions Precautions: Fall Restrictions Weight Bearing Restrictions: No       Mobility  Bed Mobility Overal bed mobility: Needs Assistance Bed Mobility: Supine to Sit, Sit to Supine     Supine to sit: Supervision Sit to supine: Supervision        Transfers Overall transfer level: Needs assistance Equipment used: None Transfers: Sit to/from Stand Sit to Stand: Min guard                Ambulation/Gait Ambulation/Gait assistance: Min guard Gait Distance (Feet): 150 Feet Assistive device: None Gait Pattern/deviations: Step-through pattern, Decreased stride length, Wide base of support Gait velocity: decreased     General Gait Details: Min guard for safety, dyanmic instability, slowed gait speed  Stairs            Wheelchair Mobility    Modified Rankin (Stroke Patients Only)       Balance Overall balance assessment: Mild deficits observed, not formally tested                                           Pertinent Vitals/Pain      Home Living Family/patient expects to be discharged to:: Private residence Living Arrangements: Spouse/significant other Available Help at Discharge: Available PRN/intermittently;Family Type of Home: House Home Access: Stairs to enter Entrance Stairs-Rails: Right;Left;Can reach both Entrance Stairs-Number of Steps: no steps in back entrance in foyer/mud room/extra room although once inside there are 2-3 steps to get into the home.   Home Layout: One level Home Equipment: None Additional Comments: family works out of the house. pt confirmed above information    Prior Function Prior Level of Function : Independent/Modified Independent             Mobility Comments: no AD, reports 1 recent fall ADLs Comments: does not drive  Hand Dominance   Dominant Hand: Left    Extremity/Trunk Assessment   Upper Extremity Assessment Upper Extremity Assessment: Generalized weakness    Lower Extremity Assessment Lower Extremity Assessment: Generalized weakness    Cervical /  Trunk Assessment Cervical / Trunk Assessment: Normal  Communication   Communication: No difficulties  Cognition Arousal/Alertness: Awake/alert Behavior During Therapy: Impulsive Overall Cognitive Status: Impaired/Different from baseline Area of Impairment: Attention, Memory, Following commands, Safety/judgement, Problem solving, Awareness                   Current Attention Level: Sustained Memory: Decreased recall of precautions, Decreased short-term memory Following Commands: Follows one step commands consistently Safety/Judgement: Decreased awareness of safety Awareness: Emergent Problem Solving: Slow processing, Requires verbal cues General Comments: A&Ox3, still unsure of situation.  Limited insight to health and safety. 1/3 short term memory recall, 2/3 with cueing        General Comments      Exercises     Assessment/Plan    PT Assessment Patient needs continued PT services  PT Problem List Decreased strength;Decreased activity tolerance;Decreased balance;Decreased mobility;Decreased cognition;Decreased safety awareness       PT Treatment Interventions DME instruction;Gait training;Stair training;Functional mobility training;Therapeutic activities;Therapeutic exercise;Balance training;Patient/family education    PT Goals (Current goals can be found in the Care Plan section)  Acute Rehab PT Goals Patient Stated Goal: go home PT Goal Formulation: With patient Time For Goal Achievement: 06/21/22 Potential to Achieve Goals: Good    Frequency Min 3X/week     Co-evaluation               AM-PAC PT "6 Clicks" Mobility  Outcome Measure Help needed turning from your back to your side while in a flat bed without using bedrails?: None Help needed moving from lying on your back to sitting on the side of a flat bed without using bedrails?: A Little Help needed moving to and from a bed to a chair (including a wheelchair)?: A Little Help needed standing up  from a chair using your arms (e.g., wheelchair or bedside chair)?: A Little Help needed to walk in hospital room?: A Little Help needed climbing 3-5 steps with a railing? : A Little 6 Click Score: 19    End of Session Equipment Utilized During Treatment: Gait belt Activity Tolerance: Patient tolerated treatment well Patient left: in bed;with call bell/phone within reach;with bed alarm set Nurse Communication: Mobility status PT Visit Diagnosis: Unsteadiness on feet (R26.81);Muscle weakness (generalized) (M62.81)    Time: 1191-4782 PT Time Calculation (min) (ACUTE ONLY): 22 min   Charges:   PT Evaluation $PT Eval Low Complexity: 1 Low          Lillia Pauls, PT, DPT Acute Rehabilitation Services Office [phone]   Norval Morton 06/07/2022, 5:09 PM

## 2022-06-07 NOTE — Evaluation (Signed)
Occupational Therapy Evaluation Patient Details Name: Kathy Little MRN: [id number] DOB: 08-29-1960 Today's Date: 06/07/2022   History of Present Illness Kathy Little 62 y.o. female who presents from home due to AMS and abdominal pain. Admitted for hypothermia and bradycardia. 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 She reports being indep with 1 recent fall at baseline. Upon evaluation she was limited by impaired cognition, generalized weakness, decreased activity tolerance and unsteady balance. Overall she needed min G for all transfers and mobility without AD. Due to the deficits listed below she also needs up to min G for LB ADLs and set up A for UB ADLs with cues for attention. Pt will benefit from continued acute OT services and home health OT to ensure safety with ADLs.        Recommendations for follow up therapy are one component of a multi-disciplinary discharge planning process, led by the attending physician.  Recommendations may be updated based on patient status, additional functional criteria and insurance authorization.   Assistance Recommended at Discharge Frequent or constant Supervision/Assistance  Patient can return home with the following A little help with walking and/or transfers;A little help with bathing/dressing/bathroom;Assistance with cooking/housework;Direct supervision/assist for medications management;Direct supervision/assist for financial management;Assist for transportation;Help with stairs or ramp for entrance    Functional Status Assessment  Patient has had a recent decline in their functional status and demonstrates the ability to make significant improvements in function in a reasonable and predictable amount of time.  Equipment Recommendations  None recommended by OT       Precautions / Restrictions Precautions Precautions:  Fall Restrictions Weight Bearing Restrictions: No      Mobility Bed Mobility Overal bed mobility: Needs Assistance Bed Mobility: Supine to Sit, Sit to Supine     Supine to sit: Supervision Sit to supine: Supervision        Transfers Overall transfer level: Needs assistance Equipment used: 1 person hand held assist Transfers: Sit to/from Stand Sit to Stand: Min guard                  Balance Overall balance assessment: Mild deficits observed, not formally tested             ADL either performed or assessed with clinical judgement   ADL Overall ADL's : Needs assistance/impaired Eating/Feeding: Independent;Sitting   Grooming: Min guard;Standing   Upper Body Bathing: Set up;Sitting   Lower Body Bathing: Min guard;Sit to/from stand   Upper Body Dressing : Set up;Sitting   Lower Body Dressing: Min guard;Sit to/from stand   Toilet Transfer: Min guard;Ambulation   Toileting- Clothing Manipulation and Hygiene: Supervision/safety;Sitting/lateral lean       Functional mobility during ADLs: Min guard General ADL Comments: no overt LOb but pt unsteady, especailly with turns. increased time needed for all tasks, cues needed for attention     Vision Baseline Vision/History: 1 Wears glasses Vision Assessment?: No apparent visual deficits     Perception Perception Perception Tested?: No   Praxis Praxis Praxis tested?: Not tested    Pertinent Vitals/Pain Pain Assessment Pain Assessment: No/denies pain     Hand Dominance Left   Extremity/Trunk Assessment Upper Extremity Assessment Upper Extremity Assessment: Generalized weakness   Lower Extremity Assessment Lower Extremity Assessment: Defer to PT evaluation   Cervical / Trunk Assessment Cervical / Trunk Assessment: Normal   Communication Communication Communication: No difficulties   Cognition Arousal/Alertness:  Awake/alert Behavior During Therapy: Impulsive Overall Cognitive Status:  Impaired/Different from baseline Area of Impairment: Attention, Memory, Following commands, Safety/judgement, Problem solving, Awareness                   Current Attention Level: Sustained Memory: Decreased recall of precautions, Decreased short-term memory Following Commands: Follows one step commands consistently Safety/Judgement: Decreased awareness of safety Awareness: Emergent Problem Solving: Slow processing, Requires verbal cues General Comments: A&Ox3, still unsure of situation. Pt had difficulty with recalling childrens age adn explaining that she had twins. Limited insight to health and safety     General Comments  VSS on RA, BP trending soft            Home Living Family/patient expects to be discharged to:: Private residence Living Arrangements: Spouse/significant other Available Help at Discharge: Available PRN/intermittently;Family Type of Home: House Home Access: Stairs to enter [employer] of Steps: no steps in back entrance in foyer/mud room/extra room although once inside there are 2-3 steps to get into the home. Entrance Stairs-Rails: Right;Left;Can reach both Home Layout: One level     Bathroom Shower/Tub: Chief Strategy Officer: Standard     Home Equipment: None   Additional Comments: family works out of the house. pt confirmed above information      Prior Functioning/Environment Prior Level of Function : Independent/Modified Independent             Mobility Comments: no AD, reports 1 recent fall ADLs Comments: does not drive        OT Problem List: Decreased strength;Decreased range of motion;Impaired balance (sitting and/or standing);Decreased activity tolerance;Decreased safety awareness;Decreased cognition;Decreased knowledge of precautions      OT Treatment/Interventions: Self-care/ADL training;Therapeutic exercise;DME and/or AE instruction;Therapeutic activities;Patient/family education;Balance training     OT Goals(Current goals can be found in the care plan section) Acute Rehab OT Goals Patient Stated Goal: home OT Goal Formulation: With patient Time For Goal Achievement: 06/21/22 Potential to Achieve Goals: Good ADL Goals Pt Will Perform Grooming: with modified independence;standing Pt Will Perform Lower Body Dressing: with modified independence;sit to/from stand Pt Will Transfer to Toilet: with modified independence;ambulating;regular height toilet Additional ADL Goal #1: Pt will indep complete IADL medication management task  OT Frequency: Min 2X/week       AM-PAC OT "6 Clicks" Daily Activity     Outcome Measure Help from another person eating meals?: None Help from another person taking care of personal grooming?: A Little Help from another person toileting, which includes using toliet, bedpan, or urinal?: A Little Help from another person bathing (including washing, rinsing, drying)?: A Little Help from another person to put on and taking off regular upper body clothing?: None Help from another person to put on and taking off regular lower body clothing?: A Little 6 Click Score: 20   End of Session Equipment Utilized During Treatment: Gait belt Nurse Communication: Mobility status  Activity Tolerance: Patient tolerated treatment well Patient left: in bed;with call bell/phone within reach;with bed alarm set  OT Visit Diagnosis: Unsteadiness on feet (R26.81);Other abnormalities of gait and mobility (R26.89);Muscle weakness (generalized) (M62.81);History of falling (Z91.81);Pain                Time: 1610-9604 OT Time Calculation (min): 21 min Charges:  OT General Charges $OT Visit: 1 Visit OT Evaluation $OT Eval Moderate Complexity: 1 Mod  Derenda Mis, OTR/L Acute Rehabilitation Services Office [phone] Secure Chat Communication Preferred   Donia Pounds 06/07/2022, 1:16 PM

## 2022-06-08 DIAGNOSIS — K7290 Hepatic failure, unspecified without coma: Secondary | ICD-10-CM | POA: Diagnosis not present

## 2022-06-08 DIAGNOSIS — K7460 Unspecified cirrhosis of liver: Secondary | ICD-10-CM | POA: Diagnosis not present

## 2022-06-08 LAB — BASIC METABOLIC PANEL
Anion gap: 5 (ref 5–15)
BUN: 18 mg/dL (ref 8–23)
CO2: 20 mmol/L — ABNORMAL LOW (ref 22–32)
Calcium: 7.8 mg/dL — ABNORMAL LOW (ref 8.9–10.3)
Chloride: 99 mmol/L (ref 98–111)
Creatinine, Ser: 1.19 mg/dL — ABNORMAL HIGH (ref 0.44–1.00)
GFR, Estimated: 52 mL/min — ABNORMAL LOW (ref >60)
Glucose, Bld: 88 mg/dL (ref 70–99)
Potassium: 4.2 mmol/L (ref 3.5–5.1)
Sodium: 124 mmol/L — ABNORMAL LOW (ref 135–145)

## 2022-06-08 LAB — CULTURE, BODY FLUID W GRAM STAIN -BOTTLE

## 2022-06-08 LAB — CBC
HCT: 31 % — ABNORMAL LOW (ref 36.0–46.0)
Hemoglobin: 10.6 g/dL — ABNORMAL LOW (ref 12.0–15.0)
MCH: 32 pg (ref 26.0–34.0)
MCHC: 34.2 g/dL (ref 30.0–36.0)
MCV: 93.7 fL (ref 80.0–100.0)
Platelets: 72 10*3/uL — ABNORMAL LOW (ref 150–400)
RBC: 3.31 MIL/uL — ABNORMAL LOW (ref 3.87–5.11)
RDW: 22.5 % — ABNORMAL HIGH (ref 11.5–15.5)
WBC: 3.6 10*3/uL — ABNORMAL LOW (ref 4.0–10.5)
nRBC: 0 % (ref 0.0–0.2)

## 2022-06-08 LAB — GLUCOSE, CAPILLARY
Glucose-Capillary: 86 mg/dL (ref 70–99)
Glucose-Capillary: 169 mg/dL — ABNORMAL HIGH (ref 70–99)

## 2022-06-08 LAB — CULTURE, BLOOD (ROUTINE X 2)

## 2022-06-08 MED ORDER — MIDODRINE HCL 5 MG PO TABS
17.5000 mg | ORAL_TABLET | Freq: Three times a day (TID) | ORAL | 0 refills | Status: DC
  Filled 2022-06-08: qty 300, 29d supply, fill #0

## 2022-06-08 NOTE — TOC Transition Note (Signed)
Transition of Care [facility]) - CM/SW Discharge Note   Patient Details  Name: Kathy Little MRN: [id number] Date of Birth: 06/16/1960  Transition of Care [facility] CM/SW Contact:  Lawerance Sabal, RN Phone Number: 06/08/2022, 10:23 AM   Clinical Narrative:     Spoke w patient over the phone, she states that she would like [facility] services through [facility], referral accepted.  No DME needs.  [facility] consulted for palliative.   Final next level of care: Home w Home Health Services Barriers to Discharge: No Barriers Identified   Patient Goals and CMS Choice      Discharge Placement                         Discharge Plan and Services Additional resources added to the After Visit Summary for                            HH Arranged: PT, OT HH Agency: [facility] Date HH Agency Contacted: 06/08/22 Time HH Agency Contacted: 1022 Representative spoke with at [facility] Agency: Barbara Cower  Social Determinants of Health (SDOH) Interventions SDOH Screenings   Food Insecurity: Food Insecurity Present (06/06/2022)  Housing: Low Risk  (06/06/2022)  Transportation Needs: Unmet Transportation Needs (06/06/2022)  Utilities: Not At Risk (06/06/2022)  Recent Concern: Utilities - At Risk (05/26/2022)  Depression (PHQ2-9): Low Risk  (04/22/2022)  Recent Concern: Depression (PHQ2-9) - Medium Risk (04/03/2022)  Tobacco Use: Medium Risk (06/06/2022)     Readmission Risk Interventions     No data to display

## 2022-06-08 NOTE — Plan of Care (Signed)

## 2022-06-08 NOTE — Discharge Summary (Addendum)
Name: Kathy Little MRN: [id number] DOB: 06-20-1960 62 y.o. PCP: Adron Bene, MD (Inactive)  Date of Admission: 06/05/2022  1:20 PM Date of Discharge: 06/08/22 Attending Physician: Dr. Criselda Peaches  Discharge Diagnosis: Principal Problem:   Acute metabolic encephalopathy Active Problems:   Encephalopathy, metabolic   Hypothermia    Discharge Medications: Allergies as of 06/08/2022       Reactions   Ampicillin Nausea And Vomiting   Flagyl [metronidazole] Nausea Only        Medication List     STOP taking these medications    acetaminophen 500 MG tablet Commonly known as: TYLENOL   ondansetron 4 MG disintegrating tablet Commonly known as: ZOFRAN-ODT       TAKE these medications    midodrine 5 MG tablet Commonly known as: PROAMATINE Take 3.5 tablets (17.5 mg total) by mouth 3 (three) times daily with meals. What changed:  medication strength how much to take when to take this   nadolol 20 MG tablet Commonly known as: Corgard Take 1 tablet (20 mg total) by mouth daily.   pantoprazole 40 MG tablet Commonly known as: PROTONIX Take 1 tablet (40 mg total) by mouth 2 (two) times daily.   Premarin vaginal cream Generic drug: conjugated estrogens Place small amount on your finger (don't use applicator) and insert into the vagina 2-3 times a week at night   rifaximin 550 MG Tabs tablet Commonly known as: XIFAXAN Take 1 tablet (550 mg total) by mouth 2 (two) times daily.   sucralfate 1 GM/10ML suspension Commonly known as: CARAFATE Take 10 mLs (1 g total) by mouth 2 (two) times daily.   sulfamethoxazole-trimethoprim 800-160 MG tablet Commonly known as: BACTRIM DS Take 1 tablet by mouth daily.        Disposition and follow-up:   KathyBrileigh Little was discharged from [facility] [place] condition.  At the hospital follow up visit please address:  1.  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 See if family was able to talk with outpatient palliative care or [facility]. Concern that she would not be liver transplant candidate. She would qualify for hospice if that fits her goals.   Esophageal varices- overdue for repeat EGD for monitoring of varices  Hyponatremia- chronic baseline has been around 125-129 recently. Limited therapy options  2.  Labs / imaging needed at time of follow-up: BMP, CBC  3.  Pending labs/ test needing follow-up: none  Follow-up Appointments:  Follow-up Information     [place], [facility] Follow up.   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 Patient endorsed adherence with rifaximin however her spouse frequently finds pills under or in her bed. He was concerned about how things are going at home. In the past she was unable to tolerate lactulose, lasix or spironolactone due to hyponatremia and GI upset. She receives weekly large volume paracentesis. During admission 11 L of ascites was removed. PMNs were low from ascites fluid and SAAG consistent with cirrhosis. She follows with Annamarie Major at [facility] and is completing tasks to get on  transplant list. I am concerned that she is not a good transplant candidate with her reserve from her cirrhosis. Her husband shared similar concerns so palliative was consulted during admission. Advanced directives were completed by palliative team. On day of discharge I sat down and talked with patient and her husband Kathy Little about her goals of care.  Kathy Little would like to go back to [facility] to talk with them about liver transplant. She is in a  tough situation. I am concerned that she would not have enough reserve to tolerate liver transplant or would pass away prior to getting offered liver. I talked with them both about what progression of decompensated cirrhosis might look like in the next few weeks to months. Kathy Little's goal is to take her to the beach as much as possible this summer. They both seemed overwhelmed and preferred to make additional decisions once she is at home. Referral was placed for outpatient palliative care.   Hx of ruptured varices Hg dropped form 12 to 8 following fluid administration, no signs of hematemesis or melena. Repeat Hgb at baseline around 10. Decrease appears dilutional in setting of small volume bolus needed due to hypotension after large volume paracentesis. Last egd 4/24 showed 3 large varices in lower esophagus and 5 bands were placed. She was to repeat endoscopy in May in outpatient setting. In the past she has not been a good candidate for TIPs with recurrent encephalopathy and elevated bilirubin. She will follow-up with [facility].  Hypothermia Bradycardia Hypoglycemia She was hypothermic to 37F on admission. She improved rapidly with bare hagger and warmed fluids.   Chronic hyponatremia Related to cirrhosis and hypervolemia. She received weekly large volume paracentesis. She has not tolerated furosemide or spironolactone in past.   History of vaginal cancer Follows with OB/GYN. Recent Pap smear with ASCUS, OB/GYN aware. Currently on Premarin cream for vaginal atrophy.     Discharge Subjective: Patient evaluated at bedside this AM. She reports feeling better this morning and is ready to go home. She did ok with getting up and out of bed this morning.  Her husband was in hospital later in the morning. I reviewed medication change with midodrine with him and they plan to pick up midodrine tomorrow at [facility] as they have midodrine doses at home.  Discharge Exam:   BP 114/79 (BP Location: Right Arm)    Pulse 97   Temp 97.7 F (36.5 C) (Oral)   Resp 16   Ht 5\' 7"  (1.702 m)   Wt 63.5 kg   SpO2 98%   BMI 21.93 kg/m  Constitutional: chronically ill-appearing, in no acute distress Cardiovascular: regular rate and rhythm, no m/r/g Pulmonary/Chest: normal work of breathing on room air, lungs clear to auscultation bilaterally Abdominal: soft, non-tender, distended MSK: sarcopenic, jaundiced Neurological: alert & oriented x 3, mild but improved asterixis present Skin: warm and dry  Pertinent Labs, Studies, and Procedures:     Latest Ref Rng & Units 06/08/2022    3:53 AM 06/07/2022    8:50 PM 06/07/2022    7:03 AM  CBC  WBC 4.0 - 10.5 K/uL 3.6   2.1   Hemoglobin 12.0 - 15.0 g/dL 19.1  47.8  8.9   Hematocrit 36.0 - 46.0 % 31.0  29.6  26.1   Platelets 150 - 400 K/uL 72   48        Latest Ref Rng & Units 06/08/2022    3:53 AM 06/07/2022    7:03 AM 06/07/2022  2:49 AM  CMP  Glucose 70 - 99 mg/dL 88  89    BUN 8 - 23 mg/dL 18  19    Creatinine 0.45 - 1.00 mg/dL 4.09  8.11    Sodium 914 - 145 mmol/L 124  127    Potassium 3.5 - 5.1 mmol/L 4.2  4.2    Chloride 98 - 111 mmol/L 99  99    CO2 22 - 32 mmol/L 20  20    Calcium 8.9 - 10.3 mg/dL 7.8  7.9    Total Protein 6.5 - 8.1 g/dL  4.5    Total Bilirubin 0.3 - 1.2 mg/dL  2.5  2.6   Alkaline Phos 38 - 126 U/L  118    AST 15 - 41 U/L  48    ALT 0 - 44 U/L  26      IR Paracentesis  Result Date: 06/06/2022 INDICATION: 62 year old female. History of hepatitis-C, cirrhosis with recurrent ascites. Request is for therapeutic and diagnostic paracentesis EXAM: ULTRASOUND GUIDED THERAPEUTIC AND DIAGNOSTIC PARACENTESIS MEDICATIONS: Lidocaine 1% 10 mL COMPLICATIONS: None immediate. PROCEDURE: Informed written consent was obtained from the patient after a discussion of the risks, benefits and alternatives to treatment. A timeout was performed prior to the initiation of the procedure. Initial ultrasound scanning demonstrates a large amount of ascites  within the right lower abdominal quadrant. The right lower abdomen was prepped and draped in the usual sterile fashion. 1% lidocaine was used for local anesthesia. Following this, a 19 gauge, 7-cm, Yueh catheter was introduced. An ultrasound image was saved for documentation purposes. The paracentesis was performed. The catheter was removed and a dressing was applied. The patient tolerated the procedure well without immediate post procedural complication. FINDINGS: A total of approximately 11 L of straw-colored fluid was removed. Samples were sent to the laboratory as requested by the clinical team. IMPRESSION: Successful ultrasound-guided therapeutic and diagnostic paracentesis yielding 11 liters of peritoneal fluid. Performed by: Anders Grant, NP PLAN: The patient has previously been formally evaluated by the [facility] Interventional Radiology Portal Hypert[facility] and is being actively followed for potential future intervention. Patient currently being worked up for possible liver transplant at outside hospital. Please see note from IR attending Dr. Domingo Dimes subtle from April 25, 2022 Electronically Signed   By: Judie Petit.  Shick M.D.   On: 06/06/2022 12:30   CT ABDOMEN PELVIS W CONTRAST  Result Date: 06/05/2022 CLINICAL DATA:  Abdominal pain, acute, nonlocalized. liver failure with ascites c/o of abdominal pain with c/o weakness EXAM: CT ABDOMEN AND PELVIS WITH CONTRAST TECHNIQUE: Multidetector CT imaging of the abdomen and pelvis was performed using the standard protocol following bolus administration of intravenous contrast. RADIATION DOSE REDUCTION: This exam was performed according to the departmental dose-optimization program which includes automated exposure control, adjustment of the mA and/or kV according to patient size and/or use of iterative reconstruction technique. CONTRAST:  75mL OMNIPAQUE IOHEXOL 350 MG/ML SOLN COMPARISON:  CT angiography abdomen pelvis 03/21/2023 FINDINGS: Lower chest:  Moderate volume hiatal hernia. Distal esophageal wall thickening. Hepatobiliary: Nodular hepatic contour. No focal liver abnormality. Calcified gallstones within the gallbladder lumen. No gallbladder wall thickening or pericholecystic fluid. No biliary dilatation. Pancreas: No focal lesion. Normal pancreatic contour. No surrounding inflammatory changes. No main pancreatic ductal dilatation. Spleen: Normal in size without focal abnormality. Adrenals/Urinary Tract: A stable 1.3 x 1 cm left adrenal gland nodule with a density of 45 Hounsfield units. No right adrenal gland nodule. Bilateral kidneys enhance symmetrically. No hydronephrosis. No hydroureter. The  urinary bladder is unremarkable. On delayed imaging, there is no urothelial wall thickening and there are no filling defects in the opacified portions of the bilateral collecting systems or ureters. Stomach/Bowel: Stomach is within normal limits. No evidence of bowel wall thickening or dilatation. Appendix appears normal. Vascular/Lymphatic: The hepatic, portal, splenic, superior mesenteric veins are patent. Paraesophageal as well as esophageal varices. No abdominal aorta or iliac aneurysm. At least moderate atherosclerotic plaque of the aorta and its branches. No abdominal, pelvic, or inguinal lymphadenopathy. Reproductive: Status post hysterectomy. No adnexal masses. Other: No intraperitoneal free fluid. No intraperitoneal free gas. No organized fluid collection. Musculoskeletal: No abdominal wall hernia or abnormality. No suspicious lytic or blastic osseous lesions. No acute displaced fracture. IMPRESSION: 1. Cirrhosis with portal hypertension.  No mass identified. 2. Distal esophageal wall thickening. Correlate with signs and symptoms of esophagitis. Consider direct visualization if clinically indicated. 3. Large volume simple free fluid ascites. 4. Cholelithiasis with no CT finding of acute cholecystitis. 5. Stable indeterminate 1.3 cm left adrenal gland  nodule. Recommend follow-up CT or MRI adrenal gland protocol in 1 year to further evaluate. Electronically Signed   By: Tish Frederickson M.D.   On: 06/05/2022 18:29   CT Head Wo Contrast  Result Date: 06/05/2022 CLINICAL DATA:  Mental status change, unknown cause EXAM: CT HEAD WITHOUT CONTRAST TECHNIQUE: Contiguous axial images were obtained from the base of the skull through the vertex without intravenous contrast. RADIATION DOSE REDUCTION: This exam was performed according to the departmental dose-optimization program which includes automated exposure control, adjustment of the mA and/or kV according to patient size and/or use of iterative reconstruction technique. COMPARISON:  CT head 05/26/22 FINDINGS: Brain: No evidence of acute infarction, hemorrhage, hydrocephalus, extra-axial collection or mass lesion/mass effect. Vascular: No hyperdense vessel or unexpected calcification. Skull: Normal. Negative for fracture or focal lesion. Sinuses/Orbits: No middle ear or mastoid effusion. Paranasal sinuses are clear. Orbits are unremarkable. Other: None. IMPRESSION: No acute intracranial abnormality. Electronically Signed   By: Lorenza Cambridge M.D.   On: 06/05/2022 18:15   DG Chest Port 1 View  Result Date: 06/05/2022 CLINICAL DATA:  Possible sepsis EXAM: PORTABLE CHEST 1 VIEW COMPARISON:  03/19/2022 FINDINGS: Cardiac shadow is stable. Lungs are hypoinflated but clear. Stable scarring in the left base is noted. No bony abnormality noted. IMPRESSION: No active disease. Electronically Signed   By: Alcide Clever M.D.   On: 06/05/2022 16:14     Discharge Instructions: Discharge Instructions     Diet - low sodium heart healthy   Complete by: As directed    Discharge instructions   Complete by: As directed    Ms. Denbow,  You came in confused due to liver failure. The best way to prevent this from happening is by taking rifaximin daily. I am concerned about how you are doing at home. I think you would benefit  from talking with a palliative medicine team to provide you and your family with additional support. I have placed a referral to [facility] in [place].  Medications changes: Increased midodrine to 17.5 mg 3 times daily with meals  Continue nadolol, rifaximin, carafate, bactrim, protonix as before.  Please call [facility] to schedule repeat endoscopy. Please also continue to follow-up with [facility]. I would ask them about their thoughts on being placed on the liver transplant list and if you would be a candidate for this surgery.  I have sent a message to the Internal Medicine Clinic to have you follow-up there within 1  week. We can check your blood work and see how you are doing after getting out of the hospital. Call them on Wednesday if you havent heard back about an appointment. [facility] # is [phone]  Thank you, Dr. Sloan Leiter   Face-to-face encounter (required for Medicare/Medicaid patients)   Complete by: As directed    I Florentina Addison Kathlynn Swofford certify that this patient is under my care and that I, or a nurse practitioner or physician's assistant working with me, had a face-to-face encounter that meets the physician face-to-face encounter requirements with this patient on 06/08/2022. The encounter with the patient was in whole, or in part for the following medical condition(s) which is the primary reason for home health care (List medical condition): Cirrhosis   The encounter with the patient was in whole, or in part, for the following medical condition, which is the primary reason for home health care: cirrhosis   I certify that, based on my findings, the following services are medically necessary home health services: Physical therapy   Reason for Medically Necessary Home Health Services: Therapy- Home Adaptation to Facilitate Safety   My clinical findings support the need for the above services: Unable to leave home safely without assistance and/or assistive device   Further, I certify that  my clinical findings support that this patient is homebound due to: Ambulates short distances less than 300 feet   Home Health   Complete by: As directed    To provide the following care/treatments:  PT OT     Increase activity slowly   Complete by: As directed        Signed: Rudene Christians, DO 06/08/2022, 12:05 PM   Pager: [phone]

## 2022-06-08 NOTE — Progress Notes (Signed)
Civil engineer, contracting [facility]) Hospital Liaison Note:   (new referral for outpatient palliative services) Notified by [facility], Lawerance Sabal, RN of patient/family request for [facility] services at home after discharge. [facility] hospital liaison will follow patient for discharge disposition. Please call with any hospice or outpatient palliative care related questions. Thank you for the opportunity to participate in this patient's care.  Redge Gainer, [facility] Liaison [phone]

## 2022-06-09 ENCOUNTER — Telehealth: Payer: Self-pay

## 2022-06-09 ENCOUNTER — Other Ambulatory Visit: Payer: Self-pay

## 2022-06-09 ENCOUNTER — Ambulatory Visit: Payer: No Typology Code available for payment source

## 2022-06-09 ENCOUNTER — Other Ambulatory Visit (HOSPITAL_COMMUNITY): Payer: Self-pay

## 2022-06-09 LAB — CULTURE, BLOOD (ROUTINE X 2)

## 2022-06-09 NOTE — Telephone Encounter (Signed)
Return call to [place] PT with [facility]. He stated pt was recently discharged from the hospital ; evaluation was completed. Stated pt has no energy, feeling tired; not engaging. Requesting verbal order for "PT once a week x 8 weeks for strengthening, and to develop a mobility routine". VO given - sending to yellow team for approval or denial.

## 2022-06-09 NOTE — Telephone Encounter (Addendum)
Kathy Little from [facility] called he is requesting verbal orders for physical therapy 1 x w for 8 weeks. Kathy Little is requesting a call back @[phone]

## 2022-06-10 ENCOUNTER — Telehealth: Payer: Self-pay

## 2022-06-10 LAB — CULTURE, BODY FLUID W GRAM STAIN -BOTTLE: Culture: NO GROWTH

## 2022-06-10 LAB — CULTURE, BLOOD (ROUTINE X 2)
Culture: NO GROWTH
Culture: NO GROWTH

## 2022-06-10 NOTE — Telephone Encounter (Signed)
(  3:04 pm) PC SW left a message for patient's husband regarding the referral for palliative care. SW requested a call back.

## 2022-06-11 ENCOUNTER — Telehealth: Payer: Self-pay | Admitting: Internal Medicine

## 2022-06-11 ENCOUNTER — Other Ambulatory Visit: Payer: Self-pay

## 2022-06-11 ENCOUNTER — Encounter: Payer: Self-pay | Admitting: *Deleted

## 2022-06-11 DIAGNOSIS — K7460 Unspecified cirrhosis of liver: Secondary | ICD-10-CM

## 2022-06-11 NOTE — Telephone Encounter (Signed)
Inbound call from patient wanting to schedule for a egd. Please advise.

## 2022-06-11 NOTE — Telephone Encounter (Signed)
Inbound call from patient requesting a call back regarding scheduling paracentesis. Please advise, thank you.

## 2022-06-11 NOTE — Telephone Encounter (Signed)
Pt is calling to schedule IR Para. Looks like pt is also calling to schedule EGD with banding and colonoscopy. Please advise .

## 2022-06-11 NOTE — Telephone Encounter (Signed)
Okay for repeat LVP up to 8 L with 50 g of IV albumin.  Send for cell count Would recommend we repeat upper endoscopy in the outpatient hospital setting for variceal banding, next available appointment with me or any available [facility] provider in the hospital space. If another provider has availability before me please let me know and I can reach out to that provider if needed. Would delay colonoscopy given recent hospitalization though we can discuss this at subsequent EGDs for variceal surveillance depending on her overall health at that time

## 2022-06-11 NOTE — Telephone Encounter (Signed)
Pt scheduled for LVP at [facility] 06/12/22 at 2pm, pt to arrive there at 1:30pm. Pt aware of appt.

## 2022-06-12 ENCOUNTER — Other Ambulatory Visit: Payer: Medicaid Other

## 2022-06-12 ENCOUNTER — Ambulatory Visit (HOSPITAL_COMMUNITY)
Admission: RE | Admit: 2022-06-12 | Discharge: 2022-06-12 | Disposition: A | Discharge status: Home / Self Care | Payer: No Typology Code available for payment source | Source: Ambulatory Visit | Attending: Gastroenterology | Admitting: Gastroenterology

## 2022-06-12 ENCOUNTER — Other Ambulatory Visit: Payer: Self-pay

## 2022-06-12 DIAGNOSIS — K7460 Unspecified cirrhosis of liver: Secondary | ICD-10-CM | POA: Diagnosis not present

## 2022-06-12 DIAGNOSIS — Z515 Encounter for palliative care: Secondary | ICD-10-CM

## 2022-06-12 DIAGNOSIS — R188 Other ascites: Secondary | ICD-10-CM | POA: Insufficient documentation

## 2022-06-12 HISTORY — PX: IR PARACENTESIS: IMG2679

## 2022-06-12 LAB — BODY FLUID CELL COUNT WITH DIFFERENTIAL: Total Nucleated Cell Count, Fluid: 6 cu mm (ref 0–1000)

## 2022-06-12 MED ORDER — ALBUMIN HUMAN 25 % IV SOLN
INTRAVENOUS | Status: AC
  Filled 2022-06-12: qty 200

## 2022-06-12 MED ORDER — LIDOCAINE HCL 1 % IJ SOLN
10.0000 mL | Freq: Once | INTRAMUSCULAR | Status: AC
  Administered 2022-06-12: 10 mL

## 2022-06-12 MED ORDER — ALBUMIN HUMAN 25 % IV SOLN
50.0000 g | Freq: Once | INTRAVENOUS | Status: AC
  Administered 2022-06-12: 50 g via INTRAVENOUS
  Filled 2022-06-12: qty 200

## 2022-06-12 MED ORDER — LIDOCAINE HCL 1 % IJ SOLN
INTRAMUSCULAR | Status: AC
  Filled 2022-06-12: qty 20

## 2022-06-12 NOTE — Telephone Encounter (Signed)
For the pts EGD with banding your next slot is 8/26. Dr. Marina Goodell has openings on 07/17/22 if you would like to check with him.

## 2022-06-13 LAB — PATHOLOGIST SMEAR REVIEW: Path Review: NEGATIVE

## 2022-06-13 NOTE — Progress Notes (Signed)
COMMUNITY PALLIATIVE CARE SW NOTE  PATIENT NAME: Kerria Sapien DOB: 03/30/1960 MRN: [id number]  PRIMARY CARE PROVIDER: Adron Bene, MD (Inactive)  RESPONSIBLE PARTY:  Acct ID - Guarantor Home Phone Work Phone Relationship Acct Type  [id number] GIANAH, BATT* [phone]  Self P/F     [address], [place], [place] [address]   Initial Palliative Care Encounter/Clinical Social Work  [employer] SW completed a telephonic encounter with patient's husband. SW discussed palliative care referral and provided education to him regarding the palliative care program and services. He provided an initial verbal consent.  He advised that patient was at the doctor's having fluid removed from her abdomen area.  He report that patient's appetite is decreased by the fluid. Patient often swells and and this causes her pain.   SW scheduled a follow-up appointment with the palliative care nurse for 6/11/124 @ 3:30 pm.   Social History   Tobacco Use   Smoking status: Former    Packs/day: .25    Types: Cigarettes    Quit date: 08/28/2014    Years since quitting: 7.7   Smokeless tobacco: Never  Substance Use Topics   Alcohol use: Yes    Alcohol/week: 0.0 standard drinks of alcohol    Comment: occasional beer    CODE STATUS: No ADVANCED DIRECTIVES: Yes MOST FORM COMPLETE: No HOSPICE EDUCATION PROVIDED: No  Duration of encounter and documentation: 30 minutes  [employer], LCSW

## 2022-06-16 ENCOUNTER — Other Ambulatory Visit: Payer: Self-pay

## 2022-06-16 DIAGNOSIS — I8500 Esophageal varices without bleeding: Secondary | ICD-10-CM

## 2022-06-16 NOTE — Telephone Encounter (Unsigned)
Pt scheduled for EGD with banding at [facility] 07/02/22 at 9:15am, pt to arrive there at 7:45am. Amb ref in epic. Case [phone]. Unable to reach pt or leave a message.

## 2022-06-16 NOTE — Telephone Encounter (Signed)
Linda Could I follow myself at [facility] on 07/02/2022 for her EGD [facility]

## 2022-06-17 ENCOUNTER — Other Ambulatory Visit: Payer: No Typology Code available for payment source

## 2022-06-17 VITALS — BP 106/62 | HR 68 | Resp 18

## 2022-06-17 DIAGNOSIS — Z515 Encounter for palliative care: Secondary | ICD-10-CM

## 2022-06-18 ENCOUNTER — Other Ambulatory Visit: Payer: Self-pay

## 2022-06-18 ENCOUNTER — Telehealth: Payer: Self-pay | Admitting: Internal Medicine

## 2022-06-18 ENCOUNTER — Telehealth: Payer: Self-pay

## 2022-06-18 DIAGNOSIS — R188 Other ascites: Secondary | ICD-10-CM

## 2022-06-18 NOTE — Telephone Encounter (Signed)
Referral entered in epic for hospice as requested.

## 2022-06-18 NOTE — Telephone Encounter (Signed)
Tonya from [facility] called to let you know they received a referral however they do not accept the patients insurance but they checked and [facility] is within her network. Asked to redirect referral to them.

## 2022-06-18 NOTE — Telephone Encounter (Signed)
Kathy Little called in in regards a referral Dr.Pyrtle send over, she want to speak with a nurse to see if the referral can be sent somewhere else.

## 2022-06-20 ENCOUNTER — Encounter: Payer: No Typology Code available for payment source | Admitting: Internal Medicine

## 2022-06-22 NOTE — Progress Notes (Signed)
[facility] Palliative Care Initial Home Encounter Note   PATIENT NAME: Kathy Little DOB: May 31, 1960 MRN: [id number]  PRIMARY CARE PROVIDER: Adron Bene, MD (Inactive)  RESPONSIBLE PARTY:  Acct ID - Guarantor Home Phone Work Phone Relationship Acct Type  [id number] [name], [name] [phone]  Self P/F     [address] [place], [place] [address]   RN completed home visit.   HISTORY OF PRESENT ILLNESS:  62 year old female with past medical history of incompletely treated hepatitis C, hepatic cirrhosis, and recurrent vaginal cancer.   Socially: Lives in one story home with boyfriend, [name], and 2 children, and 1 grandson.   Cognitive: Alert and oriented x 3. Participates in conversation and answers questions appropriately.   Appetite: Usually eats 3 times a day. "little plates three times a day" C/o persistent nausea and vomiting. "has been really bad the last two days."   Mobility: Pt c/o bilateral leg pain 9 on 1-10 scale all the time. Does not have any assistive devices. States," I get wobbly when I am up". Was putting aspercreme on her calves and it "helped some".   Sleeping Pattern: pt reports that she sleeps ok but often wakes with leg pain in calves.  Pt reports spending 90 % of the time.   Pain: Biggest complaint is pain to calves. Reports skin itches at times. Uses lotion.  Gi/GU: Denies any issues with using restroom.  Palliative Care/ Hospice: RN explained role and purpose of palliative care including visit frequency. Also discussed benefits of hospice care as well as the differences between the two with patient.   Goals of Care: Die at home.    CODE STATUS: Full Code ADVANCED DIRECTIVES: yes MOST FORM: No PPS: 40  Next appt scheduled: Dependent on hospice evaluation     PHYSICAL EXAM:   VITALS: Today's Vitals   06/17/22 1540  BP: 106/62  Pulse: 68  Resp: 18  SpO2: 95%  PainSc: 9   PainLoc: Leg    LUNGS: clear bilaterally CARDIAC:  regular, no  JVD EXTREMITIES: MAE x 4, no edema SKIN: very jaundiced.     Barbette Merino, RN

## 2022-06-24 NOTE — Progress Notes (Incomplete)
COVID Vaccine Completed:  Date of COVID positive in last 90 days:  PCP - Adron Bene, MD Cardiologist -   PET- 04/30/22 Epic Chest x-ray - 06/05/22 Epic EKG - 06/06/22 Epic Stress Test -  ECHO - 03/20/22 Epic Cardiac Cath -  Pacemaker/ICD device last checked: Spinal Cord Stimulator:  Bowel Prep -   Sleep Study -  CPAP -   Fasting Blood Sugar -  Checks Blood Sugar _____ times a day  Last dose of GLP1 agonist-  N/A GLP1 instructions:  N/A   Last dose of SGLT-2 inhibitors-  N/A SGLT-2 instructions: N/A   Blood Thinner Instructions:  Time Aspirin Instructions: Last Dose:  Activity level:  Can go up a flight of stairs and perform activities of daily living without stopping and without symptoms of chest pain or shortness of breath.  Able to exercise without symptoms  Unable to go up a flight of stairs without symptoms of     Anesthesia review: CAD, cirrhosis, hep c, thrombocytopenia, encephalopathy   Patient denies shortness of breath, fever, cough and chest pain at PAT appointment  Patient verbalized understanding of instructions that were given to them at the PAT appointment. Patient was also instructed that they will need to review over the PAT instructions again at home before surgery.

## 2022-06-27 NOTE — Progress Notes (Signed)
Attmepted to call patient but call would not go through with number in our system.Will contact office to see if another number.

## 2022-06-27 NOTE — Telephone Encounter (Signed)
BORN [birth year] DIED 2022/07/14 AGE [address] [place], [place] [facility] [employer] [place] [employer] Kathy Little passed away on 14-Jul-2022 at the age of 85 in [place], [place]. [facility] for [facility] are being provided by [employer].

## 2022-06-30 ENCOUNTER — Other Ambulatory Visit (HOSPITAL_COMMUNITY): Payer: Self-pay

## 2022-07-02 ENCOUNTER — Encounter (HOSPITAL_COMMUNITY): Admission: RE | Payer: Self-pay | Source: Home / Self Care

## 2022-07-02 ENCOUNTER — Ambulatory Visit (HOSPITAL_COMMUNITY): Admission: RE | Admit: 2022-07-02 | Payer: Medicaid Other | Source: Home / Self Care | Admitting: Internal Medicine

## 2022-07-02 SURGERY — ESOPHAGOGASTRODUODENOSCOPY (EGD) WITH PROPOFOL
Anesthesia: Monitor Anesthesia Care

## 2022-07-07 NOTE — Telephone Encounter (Signed)
See additional phone note. 

## 2022-07-07 NOTE — Telephone Encounter (Signed)
Spoke with Kathy Little and pt needs to be referred to [facility]. Contact Matt at [phone]. Referral faxed to [place] at [phone]. Kathy Little aware referral being made, hospice to be the attending.

## 2022-07-07 DEATH — deceased
